# Patient Record
Sex: Male | Born: 1935 | Race: White | Hispanic: No | Marital: Married | Smoking: Never smoker
Health system: Southern US, Community
[De-identification: ages and names within clinical notes are randomized; demographics above are authoritative.]

## PROBLEM LIST (undated history)

## (undated) DIAGNOSIS — L0390 Cellulitis, unspecified: Secondary | ICD-10-CM

## (undated) DIAGNOSIS — Q649 Congenital malformation of urinary system, unspecified: Secondary | ICD-10-CM

## (undated) DIAGNOSIS — T7840XA Allergy, unspecified, initial encounter: Secondary | ICD-10-CM

## (undated) DIAGNOSIS — T839XXA Unspecified complication of genitourinary prosthetic device, implant and graft, initial encounter: Secondary | ICD-10-CM

## (undated) DIAGNOSIS — E669 Obesity, unspecified: Secondary | ICD-10-CM

## (undated) DIAGNOSIS — M545 Low back pain: Secondary | ICD-10-CM

## (undated) DIAGNOSIS — M1990 Unspecified osteoarthritis, unspecified site: Secondary | ICD-10-CM

## (undated) DIAGNOSIS — Z0000 Encounter for general adult medical examination without abnormal findings: Secondary | ICD-10-CM

## (undated) DIAGNOSIS — F32A Depression, unspecified: Secondary | ICD-10-CM

## (undated) DIAGNOSIS — S129XXA Fracture of neck, unspecified, initial encounter: Secondary | ICD-10-CM

## (undated) DIAGNOSIS — L500 Allergic urticaria: Principal | ICD-10-CM

## (undated) DIAGNOSIS — R339 Retention of urine, unspecified: Secondary | ICD-10-CM

## (undated) DIAGNOSIS — R739 Hyperglycemia, unspecified: Secondary | ICD-10-CM

## (undated) DIAGNOSIS — G709 Myoneural disorder, unspecified: Secondary | ICD-10-CM

## (undated) DIAGNOSIS — F329 Major depressive disorder, single episode, unspecified: Secondary | ICD-10-CM

## (undated) DIAGNOSIS — I509 Heart failure, unspecified: Secondary | ICD-10-CM

## (undated) DIAGNOSIS — E538 Deficiency of other specified B group vitamins: Secondary | ICD-10-CM

## (undated) HISTORY — DX: Retention of urine, unspecified: R33.9

## (undated) HISTORY — DX: Cellulitis, unspecified: L03.90

## (undated) HISTORY — DX: Allergic urticaria: L50.0

## (undated) HISTORY — DX: Myoneural disorder, unspecified: G70.9

## (undated) HISTORY — DX: Encounter for general adult medical examination without abnormal findings: Z00.00

## (undated) HISTORY — DX: Depression, unspecified: F32.A

## (undated) HISTORY — DX: Deficiency of other specified B group vitamins: E53.8

## (undated) HISTORY — DX: Hyperglycemia, unspecified: R73.9

## (undated) HISTORY — DX: Low back pain: M54.5

## (undated) HISTORY — DX: Allergy, unspecified, initial encounter: T78.40XA

## (undated) HISTORY — PX: SURGERY SCROTAL / TESTICULAR: SUR1316

## (undated) HISTORY — DX: Unspecified osteoarthritis, unspecified site: M19.90

## (undated) HISTORY — PX: INSERTION OF SUPRAPUBIC CATHETER: SHX5870

## (undated) HISTORY — DX: Unspecified complication of genitourinary prosthetic device, implant and graft, initial encounter: T83.9XXA

## (undated) HISTORY — DX: Major depressive disorder, single episode, unspecified: F32.9

## (undated) HISTORY — DX: Obesity, unspecified: E66.9

## (undated) HISTORY — DX: Heart failure, unspecified: I50.9

---

## 1999-04-19 ENCOUNTER — Encounter: Payer: Self-pay | Admitting: Urology

## 1999-04-20 ENCOUNTER — Ambulatory Visit (HOSPITAL_COMMUNITY): Admission: RE | Admit: 1999-04-20 | Discharge: 1999-04-20 | Payer: Self-pay | Admitting: Urology

## 1999-08-10 ENCOUNTER — Inpatient Hospital Stay (HOSPITAL_COMMUNITY): Admission: RE | Admit: 1999-08-10 | Discharge: 1999-08-12 | Payer: Self-pay | Admitting: Urology

## 1999-08-10 ENCOUNTER — Encounter (INDEPENDENT_AMBULATORY_CARE_PROVIDER_SITE_OTHER): Payer: Self-pay | Admitting: *Deleted

## 2005-08-21 ENCOUNTER — Ambulatory Visit (HOSPITAL_COMMUNITY): Admission: RE | Admit: 2005-08-21 | Discharge: 2005-08-22 | Payer: Self-pay | Admitting: General Surgery

## 2006-05-31 ENCOUNTER — Encounter: Admission: RE | Admit: 2006-05-31 | Discharge: 2006-05-31 | Payer: Self-pay | Admitting: Internal Medicine

## 2006-07-04 ENCOUNTER — Encounter: Payer: Self-pay | Admitting: *Deleted

## 2006-08-02 ENCOUNTER — Ambulatory Visit (HOSPITAL_COMMUNITY): Admission: RE | Admit: 2006-08-02 | Discharge: 2006-08-02 | Payer: Self-pay | Admitting: Interventional Radiology

## 2008-05-17 ENCOUNTER — Ambulatory Visit: Payer: Self-pay | Admitting: Internal Medicine

## 2008-05-17 ENCOUNTER — Inpatient Hospital Stay (HOSPITAL_COMMUNITY): Admission: EM | Admit: 2008-05-17 | Discharge: 2008-05-21 | Payer: Self-pay | Admitting: Internal Medicine

## 2008-05-17 ENCOUNTER — Encounter: Payer: Self-pay | Admitting: Emergency Medicine

## 2008-05-19 ENCOUNTER — Encounter (INDEPENDENT_AMBULATORY_CARE_PROVIDER_SITE_OTHER): Payer: Self-pay | Admitting: Gastroenterology

## 2008-09-02 ENCOUNTER — Ambulatory Visit (HOSPITAL_COMMUNITY): Admission: RE | Admit: 2008-09-02 | Discharge: 2008-09-03 | Payer: Self-pay | Admitting: Surgery

## 2008-09-02 ENCOUNTER — Encounter (INDEPENDENT_AMBULATORY_CARE_PROVIDER_SITE_OTHER): Payer: Self-pay | Admitting: Surgery

## 2008-09-17 ENCOUNTER — Encounter: Admission: RE | Admit: 2008-09-17 | Discharge: 2008-09-17 | Payer: Self-pay | Admitting: Surgery

## 2009-03-24 ENCOUNTER — Encounter: Admission: RE | Admit: 2009-03-24 | Discharge: 2009-03-24 | Payer: Self-pay | Admitting: Internal Medicine

## 2009-04-13 ENCOUNTER — Encounter: Admission: RE | Admit: 2009-04-13 | Discharge: 2009-04-13 | Payer: Self-pay | Admitting: Neurological Surgery

## 2010-10-13 LAB — BASIC METABOLIC PANEL
BUN: 29 mg/dL — ABNORMAL HIGH (ref 6–23)
CO2: 25 mEq/L (ref 19–32)
Chloride: 107 mEq/L (ref 96–112)
Creatinine, Ser: 0.81 mg/dL (ref 0.4–1.5)
GFR calc Af Amer: >60 mL/min (ref >60)
GFR calc non Af Amer: >60 mL/min (ref >60)
Sodium: 138 mEq/L (ref 135–145)

## 2010-10-13 LAB — DIFFERENTIAL
Basophils Absolute: 0 10*3/uL (ref 0.0–0.1)
Lymphs Abs: 0.7 10*3/uL (ref 0.7–4.0)
Monocytes Absolute: 0.6 10*3/uL (ref 0.1–1.0)
Monocytes Relative: 13 % — ABNORMAL HIGH (ref 3–12)

## 2010-10-13 LAB — CBC
Hemoglobin: 12.3 g/dL — ABNORMAL LOW (ref 13.0–17.0)
RDW: 18.4 % — ABNORMAL HIGH (ref 11.5–15.5)

## 2010-11-15 NOTE — Op Note (Signed)
NAME:  Brandon Franklin, Brandon Franklin                 ACCOUNT NO.:  [id number]   MEDICAL RECORD NO.:  [id number]          PATIENT TYPE:  INP   LOCATION:  1415                         FACILITY:  [facility]   PHYSICIAN:  John C. Madilyn Fireman, M.D.    DATE OF BIRTH:  [birth year]   DATE OF PROCEDURE:  05/20/2008  DATE OF DISCHARGE:                               OPERATIVE REPORT   PROCEDURE:  Colonoscopy.   INDICATIONS FOR PROCEDURE:  Severe microcytic anemia.   DESCRIPTION OF PROCEDURE:  The patient was placed in the left lateral  decubitus position and placed on the pulse monitor with continuous low-  flow oxygen delivered by nasal cannula.  He was sedated with 15 mcg of  IV fentanyl and 1 mg of IV Versed in addition to the medicine given for  the previous EGD.  The Olympus video colonoscope was inserted into the  rectum and advanced its entire length, but despite torquing maneuvers  and abdominal pressure, I could not reach the cecum.  The colon seemed  long the tortuous and I could not straighten out any of the loops.  In  addition, the prep was suboptimal and particularly for in the most  distal parts reached.  Is not clear where the point of furthest  advancement was, but that is estimated to be somewhere in the transverse  colon near the hepatic flexure.  The visualized with areas of the mucosa  of the transverse, descending and sigmoid revealed scattered diverticula  throughout and no other abnormalities seen, although polyps less 1-2 cm  in diameter could not be rule out in all areas due to suboptimal prep.  The rectum appeared normal.  On retroflexed view, the anus revealed no  obvious internal hemorrhoids.  The scope was then withdrawn and the  patient returned to the recovery room in stable condition.  He tolerated  the procedure well and there were no immediate complications.   IMPRESSION:  Diverticulosis, otherwise normal incomplete study to near  the hepatic flexure, limited by suboptimal prep.   PLAN:  Will keep on a clear liquid diet today and follow-up with barium  enema tomorrow.           ______________________________  Everardo All Madilyn Fireman, M.D.     JCH/MEDQ  D:  05/19/2008  T:  05/20/2008  Job:  [id number]   cc:   Theressa Millard, M.D.  Fax: [phone]

## 2010-11-15 NOTE — Consult Note (Signed)
NAME:  Brandon Franklin, Brandon Franklin                 ACCOUNT NO.:  [id number]   MEDICAL RECORD NO.:  [id number]          PATIENT TYPE:  INP   LOCATION:  1415                         FACILITY:  [facility]   PHYSICIAN:  Shirley Friar, MDDATE OF BIRTH:  [birth year]   DATE OF CONSULTATION:  DATE OF DISCHARGE:                                 CONSULTATION   REASON FOR CONSULTATION:  Anemia.   HISTORY OF PRESENT ILLNESS:  [name] is an pleasant 77 year old white  male admitted on May 17, 2008 secondary to groin pain and anemia.  On the day of admission he reported acute right-sided groin pain that  occurred after he sneezed 3 times.  The pain was worse with movement and  improved if he lied still.  He had a CT scan done at an outside  emergency room which was negative for any source of the groin pain.  His  hemoglobin was found to be 7.1 with an MCV of 65.  He was therefore  admitted for further evaluation of this anemia.  He denies any visible  rectal bleeding and denies any hematemesis, melena or hematochezia.  He  takes 4 regular aspirins per day.  He reports a colonoscopy over 5 years  ago but does not know the results and they are not available at the time  of this dictation.  As stated above, his hemoglobin was 7.1 with an MCV  of 65, platelet count 211, and white blood count 5.4.  BUN was elevated  at 25, creatinine at 1.0.  His fecal occult blood test negative.   PAST MEDICAL HISTORY:  1. Arthritis.  2. Benign prostatic hypertrophy.  3. Status post TURP.   MEDICATIONS:  1. Aspirin (four 325 mg tablets per day).  2. Over-the-counter oral analgesics.   ALLERGIES:  NO KNOWN DRUG ALLERGIES.   SOCIAL HISTORY:  Denies alcohol and drugs or tobacco.   FAMILY HISTORY:  Noncontributory.   REVIEW OF SYSTEMS:  Negative from a GI standpoint except stated above.   PHYSICAL EXAMINATION:  VITAL SIGNS:  Temperature 97.7, pulse 88, blood  pressure 155/93, O2 sats 94% on room air.  GENERAL:  Alert,  no acute distress.  ABDOMEN:  Soft, nontender, nondistended.  Active bowel sounds.   LABORATORY DATA:  White blood count 5.4, hemoglobin 7.1, MCV 65,  hematocrit 23, platelet count 211, INR 1.1, BUN 25, creatinine 1.0.  All  other labs listed in hospital record.   IMPRESSION:  An 77 year old white male with microcytic anemia with heme-  negative stool.  Agree with anemia workup, checking multiple lab studies  to see whether any other sources present for his iron loss other than  occult  gastrointestinal blood loss.  Would do a colonoscopy and upper endoscopy  on May 19, 2008 to look for sources of occult blood loss.  Will  prep patient today and put him on clear liquids. Will plan to do these  procedures tomorrow.      Shirley Friar, MD  Electronically Signed     VCS/MEDQ  D:  05/18/2008  T:  05/18/2008  Job:  [id number]   cc:   Theressa Millard, M.D.  Fax: [phone]

## 2010-11-15 NOTE — Op Note (Signed)
NAME:  Brandon Franklin, Brandon Franklin                 ACCOUNT NO.:  [id number]   MEDICAL RECORD NO.:  [id number]          PATIENT TYPE:  INP   LOCATION:  1415                         FACILITY:  [facility]   PHYSICIAN:  John C. Madilyn Fireman, M.D.    DATE OF BIRTH:  07-30-1925   DATE OF PROCEDURE:  05/19/2008  DATE OF DISCHARGE:                               OPERATIVE REPORT   ESOPHAGOGASTRODUODENOSCOPY   INDICATIONS FOR PROCEDURE:  Severe microcytic anemia.   PROCEDURE:  The patient was placed in the left lateral decubitus  position and placed on the pulse monitor with continuous low-flow oxygen  delivered by nasal cannula.  He was sedated 35 mcg IV fentanyl and 3 mg  IV Versed.  The Olympus video endoscope was advanced under direct vision  into the oropharynx and esophagus.  The esophagus was straight and of  normal caliber at the squamocolumnar line at 36 above and approximately  3-cm hiatal hernia.  There was no ring, stricture, esophagitis, or other  visible abnormality of the GE junction.  The stomach was entered, and a  small amount of liquid secretions were suctioned from the fundus.  Retroflexed view of the cardia confirmed a hiatal hernia, and it was  otherwise unremarkable.  The fundus and body appeared normal.  The  antrum showed a few areas of punctate erythema and 2 or 3 tiny erosions  with a scant amount of exudate consistent with mild to moderate antral  gastritis.  CLO-test was obtained.  The pylorus was not deformed and  easily allowed passage of the endoscope tip in the duodenum.  In the  bulb, there was erythema and granularity with 2 or 3 small erosions with  exudate and 1 shallow ulcer approximately 5 x 2 mm in diameter in the  distal bulb with no definite stigma of hemorrhage.  The postbulbar  duodenum appeared relatively normal.  Biopsies were taken to rule out  celiac disease.  The scope was then withdrawn, and the patient prepared  for colonoscopy.  He tolerated the procedure well, and  there were no  immediate complications.   IMPRESSION:  1. Small duodenal ulcer and several duodenal and tiny gastric      erosions.  2. Five moderate-sized hiatal hernia.   PLAN:  Will treat proton-pump inhibitor and treat for eradication of  Helicobacter if CLO-test is positive and will proceed with colonoscopy.           ______________________________  Everardo All. Madilyn Fireman, M.D.     JCH/MEDQ  D:  05/19/2008  T:  05/20/2008  Job:  [id number]   cc:   Theressa Millard, M.D.  Fax: [phone]

## 2010-11-15 NOTE — Discharge Summary (Signed)
NAME:  Brandon Franklin, Brandon Franklin                 ACCOUNT NO.:  [id number]   MEDICAL RECORD NO.:  [id number]          PATIENT TYPE:  INP   LOCATION:  1415                         FACILITY:  [facility]   PHYSICIAN:  Theressa Millard, M.D.    DATE OF BIRTH:  16-May-1926   DATE OF ADMISSION:  05/17/2008  DATE OF DISCHARGE:  05/21/2008                               DISCHARGE SUMMARY   ADMITTING DIAGNOSIS:  Right groin pain and microcytic anemia.   DISCHARGE DIAGNOSES:  1. Chronic blood loss anemia.  2. Duodenal ulceration.  3. Iron deficiency anemia secondary to number #2.  4. Right groin pain, ? Etiology.  5. Chronic aches and pains for which the patient takes aspirin      chronically.  6. Minor transfusion reaction with fever during transfusion, all      subsequent studies negative for evidence of serious transfusion      reaction.   The patient is an 77 year old white male who is rarely seen in the  office and has been in generally good health.  Takes 2 - 3 aspirin two  to three times a day for various aches and pains.  He developed right  groin pain and was seen in the emergency department in [facility] and  was found to have severe microcytic anemia.   HOSPITAL COURSE:  The patient was admitted and was noted to have a  hemoglobin of 7.1 with an MCV of 65.  He was given 2 units of blood and  his hemoglobin rose to about 8.3.  He was seen in consultation by GI who  performed an upper endoscopy with findings of duodenal erosions but no  active bleeding.  Colonoscopy was incomplete but was negative.  A follow-  up single contrast barium enema did not reveal any significant lesions.  Of note is the fact that the patient had a CT scan of the abdomen during  his emergency room visit and there were no finding to that either.   Although the patient was heme-negative it is concluded that the patient  is suffering from a chronic blood loss anemia from the duodenal  ulceration.  He has been asked to stop  aspirin products and has been  started on proton pump inhibitors.   DISCHARGE MEDICATIONS:  1. Proton pump inhibitor (omeprazole) 20 mg daily for the next month.  2. Iron sulfate (65 mg of iron, 325 mg total) take one daily.   He is to no longer take aspirin, Advil, ibuprofen or Aleve.  He may use  acetaminophen for aches and pains.  He will call to make an appointment  to see me in approximately 3 weeks.     Theressa Millard, M.D.  Electronically Signed    JO/MEDQ  D:  05/21/2008  T:  05/21/2008  Job:  [id number]

## 2010-11-15 NOTE — Op Note (Signed)
NAME:  Brandon Franklin, Brandon Franklin                 ACCOUNT NO.:  [id number]   MEDICAL RECORD NO.:  [id number]          PATIENT TYPE:  OIB   LOCATION:  1528                         FACILITY:  [facility]   PHYSICIAN:  Wilmon Arms. Corliss Skains, M.D. DATE OF BIRTH:  [birth year]   DATE OF PROCEDURE:  09/02/2008  DATE OF DISCHARGE:                               OPERATIVE REPORT   PREOPERATIVE DIAGNOSIS:  Large right inguinal hernia.   POSTOPERATIVE DIAGNOSIS:  Very large right inguinal hernia.   PROCEDURE PERFORMED:  Right inguinal hernia repair with mesh.   SURGEON:  Wilmon Arms. Corliss Skains, M.D.   ASSISTANT:  Anselm Pancoast. Zachery Dakins, M.D.   ANESTHESIA:  General endotracheal.   INDICATIONS FOR PROCEDURE:  The patient is an 77 year old male who has  had a previous right Spigelian hernia repair and left inguinal hernia  repair in 2007.  He began having right groin pain in November and was  found to have a fairly large right inguinal hernia.  This has become  much larger in just a couple of months.  He denies any GI obstructive  symptoms.  He denies any urinary symptoms.  He presents now for right  inguinal hernia repair with mesh.   DESCRIPTION OF PROCEDURE:  The patient is brought to the operating room,  placed in supine position on the operating room table.  After an  adequate level of general anesthesia was obtained, the patient's right  groin was shaved, prepped with Betadine and draped in sterile fashion.  Time-out was taken to assure proper patient, proper procedure.  We were  able to reduce this hernia manually.  An oblique incision was made above  the right inguinal ligament after infiltrating with 0.25% Marcaine with  epinephrine.  Dissection was carried down through a considerable amount  of subcutaneous fat to the external oblique fascia.  There is a large  hernia bulge coming through the external ring.  We opened the external  oblique fascia along direction of its fibers down to the external ring.  There were  some Prolene sutures noted in the fascia.  There were a lot  of adhesions laterally and just underneath the external oblique fascia.  In retrospect this probably represented the mesh from his previous  Spigelian hernia repair by Dr. Maryagnes Amos.  We opened up the external oblique  fascia all the way down to the external ring.  We were able to bluntly  dissect around a very large spermatic cord.  We retracted this with a  Penrose drain.  The floor of the inguinal canal was inspected and was  lax but there was no obvious direct defect.  We then began trying to  skeletonize the very large spermatic cord.  The hernia sac was enormous  and ran down into the scrotum.  We were able to manually retract this up  into the wound along with the testicle.  We had a lot of difficulty  trying to dissect a plane between the hernia sac and the testicle.  Therefore, we opened the hernia sac.  There were lot adhesions inside  the hernia sac but we  were able to dissect into the sac.  We could  clearly identify the cord structures with the vessels in the vas  retracted medially.  The patient has a large amount of small bowel  incarcerated in his hernia sac.  The small bowel was densely adherent to  the side of the sac.  We spent some considerable amount of time  dissecting the small bowel free from the side of the hernia sac.  We  inspected the small bowel for damage and there was none.  We did notice  a Meckel's diverticulum along the small bowel.  However we chose to  leave this alone as removing it would have precluded the use of  synthetic mesh.  We reduced of the small bowel up the back in the  peritoneal space.  We dissected the upper end of the hernia sac free  from the surrounding tissue.  We amputated the excess tissue and sent  this for pathologic examination.  The neck of the hernia sac was then  ligated with 2-0 Vicryl suture and the stump was reduced up to the  preperitoneal space.  We then inspected  the remaining cord structures.  There is considerable amount of fat around the vessels of the spermatic  cord but there is no other sign of hernia.  We then began our repair by  running the floor of the inguinal canal with a 2-0 Prolene suture from  the pubic tubercle all the way back to the internal ring.  We cut a 3 x  6 inch piece of Ultrapro mesh and secured this beginning at the pubic  tubercle with 2-0 Prolene.  We cut the keyhole shape to accommodate the  large cord.  We secured this to the shelving edge inferiorly and the  internal oblique fascia superiorly.  The lateral end of the mesh was  tacked down to the previously placed Gore-Tex mesh.  We tried to tuck  some of the mesh underneath the external oblique fascia laterally but  this is fairly difficult due to the dense adhesions.  We removed the  Penrose drain.  The fascia was reapproximated with 2-0 Vicryl.  3-0  Vicryl was used to close subcutaneous tissues and 4-0 Monocryl was used  to close skin.  Steri-Strips and clean dressings were applied.  The  patient was then extubated and brought to the recovery room in stable  condition.  All sponge, instrument, and needle counts correct.      Wilmon Arms. Tsuei, M.D.  Electronically Signed     MKT/MEDQ  D:  09/02/2008  T:  09/03/2008  Job:  [id number]

## 2010-11-15 NOTE — H&P (Signed)
NAME:  Brandon Franklin, Brandon Franklin                 ACCOUNT NO.:  [id number]   MEDICAL RECORD NO.:  [id number]          PATIENT TYPE:  INP   LOCATION:  1236                         FACILITY:  [facility]   PHYSICIAN:  Therisa Doyne, MD    DATE OF BIRTH:  Oct 30, 1925   DATE OF ADMISSION:  05/17/2008  DATE OF DISCHARGE:                              HISTORY & PHYSICAL   CHIEF COMPLAINT:  Groin pain and anemia.   HISTORY OF PRESENT ILLNESS:  An 77 year old white male with no  significant past medical history, who presents as a direct admission for  workup of anemia.  The patient developed acute right-sided groin pain  yesterday.  This occurred after he sneezed 3 times.  The pain is worse  with movement and is improved if he lies still.  Because of these  symptoms he went to an outside emergency department at [facility]; there  they performed a CT scan, which was unrevealing for his groin pain.  However, further workup showed a hemoglobin of 7.1 with mean cell volume  of 65.  Because of these findings, he was transferred to [facility] for further evaluation and management.   On talking with the patient he denies any fatigue, weakness, weight loss  or weight gain.  He denies episodes of hematemesis, hematochezia, melena  or bright red blood per rectum.  He denies any alcohol use, but does  state that he uses 4 tablets of aspirin daily; he uses 325 mg tablets,  for a total of 1300 mg daily.  He also used over-the-counter analgesics.   PAST MEDICAL HISTORY:  1. Arthritis.  2. Status post TURP for benign prostatic hypertrophy.   SOCIAL HISTORY:  The patient lives in [place] with his wife.  He  denies any tobacco, alcohol or drugs.   FAMILY HISTORY:  Negative for GI malignancies.   REVIEW OF SYSTEMS:  Positive for right-sided hip pain.  Otherwise, all  systems reviewed were negative, except as mentioned above in history of  present illness.   MEDICATIONS:  1. Aspirin, approximately 1300 mg  daily.  2. Over-the-counter oral analgesics.  No prescription medications.   ALLERGIES:  NO KNOWN DRUG ALLERGIES.   PHYSICAL EXAMINATION:  VITAL SIGNS: Temperature afebrile.  Blood  pressure 164/84, pulse 75, respirations 18, oxygen saturation 98% on  room air.  GENERAL:  No acute distress.  HEENT:  Normocephalic atraumatic.  Pupils equal, round and equal to  light and accommodation.  Extraocular movements are intact.  Oropharynx  was pink and moist without any lesions.  NECK:  Supple.  No lymphadenopathy.  No jugular venous distention  bilaterally.  ABDOMEN:  Positive bowel sounds; soft, nontender and nondistended.  EXTREMITIES:  No clubbing, cyanosis or edema.  Dorsalis pedis pulses 2+  bilaterally.  SKIN:  No rashes.  BACK:  No CVA tenderness.   LABORATORY STUDIES:  Hemoglobin 7.1 with an MCV of 65, platelets 236,  white blood cell count 5.2.  BMP:  Within normal limits.  Normal  coagulation studies. Negative urinalysis.  Lactic acid 0.9.  Fecal  occult blood  was negative.  Reticulocyte count 2.2%.  CT scan of the  abdomen and pelvis did not reveal any cause of the patient's acute  abdominal pain.  He did have mild cardiomegaly with a small pericardial  effusion.  Other incidental findings were noted.   EKG:  1. Normal sinus rhythm at 75 beats per minute.  2. EKG #2 -- Normal sinus rhythm at 81 beats per minute, with      premature atrial contractions and a left anterior fascicular block.   ASSESSMENT AND PLAN:  Mr. Ardoin is an 77 year old white male with no  significant past medical history.  He presents to [facility]  for evaluation of microcytic anemia.   1. We will admit the patient to [facility] at [facility].  Will monitor him on a telemetry bed under the care Dr.      Theressa Millard.   1. Microcytic anemia:  At this time it is an unclear source where the      patient's blood loss is from.  I suspect this may be occult GI      bleeding.   However, we will work this out with an iron panel,      vitamin B12 level, folate level, reticulocyte panel, and a Comb's      test.  Check hepatic function panel and thyroid studies.  We will      transfuse the patient 2 units of packed red blood cells now, and      check the post-CBC transfusion level.  Will consider GI consult in      the morning for possible EGD and colonoscopy to further evaluate      his microcytic anemia; heme check stools.   1. Groin pain:  Question whether or not this is musculoskeletal in      nature, as this occurred after sneezing and it is worse with      movement.  His CT scan was negative and did not reveal any findings      for acute hernias.   1. Fluids, electrolytes, nutrition:  Normal saline at 125 mL per hour      until electrolytes are stable.  N.P.O.   1. Prophylaxis:  Deep vein thrombosis prophylaxis - Pneumatic      compression to the bilateral extremities.  We will not be using      Lovenox, as the patient has anemia and it is unclear whether he is      actively bleeding.      Therisa Doyne, MD  Electronically Signed     SJT/MEDQ  D:  05/17/2008  T:  05/17/2008  Job:  [id number]

## 2010-11-18 NOTE — Consult Note (Signed)
NAME:  Brandon Franklin, Brandon Franklin                 ACCOUNT NO.:  [id number]   MEDICAL RECORD NO.:  [id number]          PATIENT TYPE:  OUT   LOCATION:  XRAY                         FACILITY:  [facility]   PHYSICIAN:  Sanjeev K. Deveshwar, M.D.DATE OF BIRTH:  10/27/1925   DATE OF CONSULTATION:  07/04/2006  DATE OF DISCHARGE:                                 CONSULTATION   CHIEF COMPLAINT:  Cerebral aneurysms.   HISTORY OF PRESENT ILLNESS:  This is a very pleasant 77 year old male  who was referred to Dr. Corliss Skains through the courtesy of Dr. Deneen Harts.  The patient has a recent history of headaches, dizziness and  presyncope.  He was evaluated by Dr. Deneen Harts and an MRA was  performed on July 31, 2005.  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  There was also noted be mild  atherosclerosis in the right middle cerebral artery distribution.  The  patient presents today with his wife to discuss treatment options.   PAST MEDICAL HISTORY:  The patient has been very healthy.  He has a  history gastroesophageal reflux disease.  He has had some seasonal  allergies and sinus trouble.  He has a lot of neck pain which might be  degenerative joint disease, although he also has constant headaches  associated with his neck pain.  There is a question of old CVAs on his  MRI.  He has also had some question of TIAs.  There is a possibility of  orthostatic hypotension.   SURGICAL HISTORY:  Significant for hernia repairs as well as TURP.  He  denies any previous problems with anesthesia.   ALLERGIES:  He denies allergy to latex or contrast dye.  He is allergic  to SULFA.   CURRENT MEDICATIONS:  He takes aspirin 325 mg daily as his only  scheduled medication.   SOCIAL HISTORY:  The patient is married.  He has two daughters.  He  lives in [place].  He is never smoked or used alcohol to  any  significant degree.  He is retired from [employer].   FAMILY HISTORY:  Both parents died from strokes in their 69s or 5s.   IMPRESSION AND PLAN:  As noted the patient presents today to discuss  evaluation and possible treatment of two possible cerebral aneurysms  noted on an MRA performed July 31, 2005, to evaluate headaches and  dizziness.  Dr. Corliss Skains reviewed the results of the MRI/MRA with the  patient and his wife.  He pointed out the areas that were concerning for  aneurysms.  Treatment options were discussed including continued  monitoring versus open surgery versus possible coiling and/or stenting.  The risks and benefits of each option were discussed, as well. All of  their questions were addressed.  They seem to have a good understanding  of the situation by the end of the consultation.   Dr. Corliss Skains recommended a cerebral angiogram for further evaluation of  the possible  aneurysms. The patient and his wife are anxious to proceed  with the angiogram and endovascular treatment if felt to be indicated.  Dr. 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  The patient was given a prescription for Plavix to be  started three days prior to the intervention.  He felt that the second  aneurysm could be monitored and may not require treatment.   Greater than 40 minutes was spent on this consult.      Delton See, P.A.    ______________________________  Grandville Silos. Corliss Skains, M.D.    DR/MEDQ  D:  07/04/2006  T:  07/04/2006  Job:  [id number]   cc:   Theressa Millard, M.D.  Bevelyn Buckles. Nash Shearer, M.D.

## 2010-11-18 NOTE — Op Note (Signed)
[place]  Patient:    Brandon Franklin, Brandon Franklin                          MRN: [id number] Proc. Date: 08/10/99 Adm. Date:  [id number] Attending:  Katherine Roan CC:         Winn Jock. Earl Gala, M.D.                           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  OPERATION:  Transurethral resection if bladder mass, midtrigone, and transurethral resection of prostate.  SURGEON:  Rozanna Boer., M.D.  ANESTHESIA:  General.  DESCRIPTION OF PROCEDURE:  The patient was placed on the operating table in the  dorsal lithotomy position.  After satisfactory induction of general endotracheal anesthesia, he was prepped and draped with Betadine, given IV antibiotics.  He id have a coronal hypospadias and the urethra was dilated with a 30 [employer] sound after which the 28 Olympus continuous-flow resectoscope was inserted.  The bladder was carefully inspected.  No other lesions were seen except on the trigone with  about a 2 cm bladder mass in midtrigone was seen and photographed and excised with a loop with three different cuts.  This was down to the base.  The base was fulgurated.  The tissue was then removed and sent for separate specimen and remaining recurrent BPH was then excised beginning at the bladder neck on the left side at the 12 oclock position.  We moved laterally in a clockwise fashion resection the adenoma down to prostatic surgical capsule.  Then on the right side in a counterclockwise fashion, the remaining tissue on the patients right side as similarly resected.  The median lobe was resected flush with the verumontanum.  Hemostasis was achieved by electrocoagulation, ___________ evacuated from the bladder.  When viewing from the veru of  the bladder neck, it was wide open and clear.  As the scope was removed the patient voided with a good stream.  A 24 Ainsworth catheter was inserted and left to straight drainage with mild traction. The irrigant was clear.  The patient was taken to the recovery room in good condition.  He will be admitted for postoperative care. DD:  08/10/99 TD:  08/10/99 Job: [id number]

## 2010-11-18 NOTE — H&P (Signed)
[place]. [facility]  Patient:    Brandon Franklin, Brandon Franklin                          MRN: [id number] Adm. Date:  08/09/99 Attending:  Rozanna Boer., M.D. CC:         Winn Jock. Earl Gala, M.D.                         History and Physical  BRIEF HISTORY:  This 77 year old white male was admitted with hematuria and finding of a bladder mass and recurrent BPH, for TUR.  He had a previous TURP, March 1992. He had an IVP done July 22, 1999 because of a new finding of hematuria, and e had normal upper tracts, increased post-void residual, and cystoscopy showed inflammatory mass at the base of the bladder on the trigone that was separate from his recurrent BPH, which was also noted.  The hematuria was first found on June 21, 1999.  He has been voiding with a little bit more hesitancy and urgency than he has been recently.  He has not had any urinary tract infections.  PAST MEDICAL HISTORY:  MEDICATIONS:  None except aspirin which was discontinued two weeks ago.  ALLERGIES:  None.  PREVIOUS OPERATIONS:  TURP, March 1992 and bilateral spermatoceles, October 2000.  REVIEW OF SYSTEMS:  Nonsmoker.  No cardiac or pulmonary symptomatology.  He does have a history of allergies to dust, etc.  No bowel change.  No alcohol.  No arthritis.  FAMILY HISTORY/SOCIAL HISTORY:  He is married with two daughters.  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  His parents both died of strokes.  He has one sister.  He has no family history of diabetes, cancer, or heart disease.  PHYSICAL EXAMINATION:  GENERAL:  Pleasant, jovial, white male with white hair.  The patient appears to be in no acute distress.  VITAL SIGNS:  Blood pressure 169/97, pulse 81.  Weight 253.  Height 5 feet 11 inches.  HEENT:  Clear.  He has most of his teeth present.  CHEST:  Clear.  HEART:  Heart sounds are normal.  No murmurs, gallops, or rubs.  ABDOMEN:  Soft, benign, but obese.  No  masses.  His bladder is not distended.  GENITOURINARY:  Normal scrotum.  Testes bilaterally descended.  He does have a coronal hypospadias noted with a dorsal hood.  EXTREMITIES:  No edema.  Good distal pulses.  Intact sensation to light touch.  IMPRESSION:  1. Inflammatory bladder mass.  2. Hematuria.  3. Recurrent benign prostatic hypertrophy.  RECOMMENDATIONS:  TURBT and TURP as indicated. DD:  08/10/99 TD:  08/10/99 Job: [id number]

## 2010-11-18 NOTE — Discharge Summary (Signed)
[place]. [facility]  Patient:    Brandon Franklin, Brandon Franklin                          MRN: [id number] Adm. Date:  [id number] Disc. Date: [id number] Attending:  Katherine Roan Dictator:   Rozanna Boer., M.D. CC:         Winn Jock. Earl Gala, M.D.                           Discharge Summary  DISCHARGE DIAGNOSES: 1.  Bladder mass, trigone; pending pathology report. 2.  Hematuria. 3.  Recurrent benign prostatic hypertrophy.  OPERATION/PROCEDURE: 1.  TUR bladder tumor, mid trigone (2.0 cms), TUR prostate on August 10, 1999.  HISTORY OF PRESENT ILLNESS:  This 77 year old patient has had hematuria with finding on cystoscopy of a bladder mass in the mid trigone and seen to be separate from his recurrent BPH.  He had a previous TURP in March 1992.  IVP on July 12, 1999 showed normal upper tracts, increased postvoid residual, an cystoscopy showed the inflammatory mass at the base of the bladder on the trigone.  He also had recurrent BPH.  Hematuria was first found in December 2000.  He had bilateral spermatoceles removed October 2000 in addition to his TURP.  MEDICATIONS:  None.  ALLERGIES:  None.  REVIEW OF SYSTEMS:  Unremarkable.  HOSPITAL COURSE:  After satisfactory preoperative evaluation he was taken to the operating room.  His BUN was 22, his creatinine 1.0.  His electrolytes were normal. His hematocrit was 40.6 preoperatively and 37.5 postoperatively.  He had separate resection of the mid trigone bladder mass and also his prostate tissue but the pathology was still pending at the time of discharge.  His urine cleared overnight and the catheter was removed on the second postoperative day, after which he had voided a couple of times with clear urine without difficulty.  DISCHARGE MEDICATIONS: 1.  Septra DS 1 tablet p.o. b.i.d. for ten days. 2.  No aspirin.  DISPOSITION:  Return to the office in ten to 14 days for  follow-up.  ACTIVITY:  Instructed in light activity. DD:  08/12/99 TD:  08/12/99 Job: [id number]

## 2010-11-18 NOTE — Consult Note (Signed)
NAME:  ANNIE, ROSEBOOM                 ACCOUNT NO.:  [id number]   MEDICAL RECORD NO.:  [id number]          PATIENT TYPE:  AMB   LOCATION:  SDS                          FACILITY:  [facility]   PHYSICIAN:  Sanjeev K. Deveshwar, M.D.DATE OF BIRTH:  04/30/1926   DATE OF CONSULTATION:  08/02/2006  DATE OF DISCHARGE:                                 CONSULTATION   BRIEF HISTORY:  Mr. Airrion Otting is a very pleasant 77 year old male  referred to Dr. Corliss Skains through the courtesy of Dr. Deneen Harts.  Please see the dictated consult note from July 04, 2006.  Arrangements  were made to have the patient admitted to [facility] today  August 02, 2006 for a cerebral angiogram and possible stenting and/or  coiling of cerebral aneurysms previously noted on an MRA performed  July 31, 2005.   Dr. Corliss Skains did perform the cerebral angiogram today; however, he was  not able to intervene on the aneurysms due to severe tortuosity of the  patient's vessels.  The aneurysms did appear to be stable.  Dr.  Corliss Skains has recommended further monitoring with repeat MRI/MRAs.  The  patient was told to remain on aspirin.  He had been started on Plavix  prior to this admission in anticipation of possible stenting.  He was  told he could stop his Plavix medication.   LABORATORY DATA:  On admission the patient's BUN was 25, creatinine was  0.8.  His INR was 1.1, his PTT was 35.  Hemoglobin was noted to be  mildly low at 10.9, hematocrit 33.2, WBC 6.6 thousand, platelets  215,000.  His GFR was greater than 60.  Potassium was 4.3, glucose 92.   IMPRESSION:  1. Cerebral aneurysms by magnetic resonance imaging and cerebral      angiogram performed August 02, 2006.  Please see Dr. Fatima Sanger      dictated report for full details.  2. No intervention due to severe tortuosity of the patient's vessels.  3. Mild anemia by a complete blood count performed on July 31, 2006.  4. History of gastroesophageal  reflux disease.  5. History of seasonal allergies and sinus problems.  6. Degenerative joint disease.  7. Old cerebrovascular accidents by magnetic resonance imaging with      questionable transient ischemic attacks.  8. SULFA ALLERGY.      Delton See, P.A.    ______________________________  Grandville Silos. Corliss Skains, M.D.    DR/MEDQ  D:  08/02/2006  T:  08/02/2006  Job:  [id number]   cc:   Theressa Millard, M.D.  Bevelyn Buckles. Nash Shearer, M.D.

## 2010-11-18 NOTE — Op Note (Signed)
NAME:  ESPN, ZEMAN                 ACCOUNT NO.:  [id number]   MEDICAL RECORD NO.:  [id number]          PATIENT TYPE:  AMB   LOCATION:  SDS                          FACILITY:  [facility]   PHYSICIAN:  Gita Kudo, M.D. DATE OF BIRTH:  10/26/1925   DATE OF PROCEDURE:  08/21/2005  DATE OF DISCHARGE:                                 OPERATIVE REPORT   OPERATIVE PROCEDURE:  Left inguinal hernia repair, direct, Kugel two-piece  mesh.   SURGEON:  Gita Kudo, M.D.   ANESTHESIA:  General.   PREOPERATIVE DIAGNOSIS:  Left inguinal hernia.   POSTOPERATIVE DIAGNOSIS:  Left inguinal hernia, direct.   CLINICAL SUMMARY:  A 77 year old male with longstanding left inguinal  hernia, who comes in for elective repair.  Of note is the fact that I  repaired a right spigelian hernia on him about 10 years ago.   OPERATIVE FINDINGS:  The patient had a large direct inguinal hernia with no  evidence of an indirect hernia.  The cord structures and nerves were  identified and not injured.   OPERATIVE PROCEDURE:  Under satisfactory general endotracheal anesthesia,  having received 1 g Ancef preop, the patient was positioned, prepped and  draped in the standard fashion.  A total of 30 mL of 0.5% Marcaine with  epinephrine was infiltrated for postop analgesia.  A transverse left lower  abdominal incision made, carried down to and through external ring and  external oblique.  Bleeders were coagulated or tied with 3-0 Vicryl.  Self-  retaining retractors were placed and with good exposure, the cord and its  contents mobilized, dissected for an indirect sac and none found.  Then they  were held away with a Penrose drain while the direct hernia was freed around  to its edges.  Then an incision made over the hernia through the posterior  wall of the canal from the pubis to the internal ring.  Finger dissection  used to reduce the preperitoneal contents, which was then held away with  moistened gauze.   Following this, the circular portion of Kugel mesh was  anchored at Cooper's ligament with a 0 Prolene suture and unfolded laterally  and inferiorly.  Then the gauze was removed and the mesh unfolded superiorly  and medially and lay in excellent position.  A second 0 Prolene suture was  used to anchor the medial portion of the mesh to the undersurface of the  abdominal wall.  The floor of the canal was then closed with a running 0  Prolene suture, taking intermittent bites of the mesh and when tied at the  internal ring, the ring was snug and the ends of the suture left long.  Then  the oval portion of the mesh was anchored at the internal ring with a  previously-placed suture and tacked around the periphery with three sutures  to the internal oblique, soft tissue near the pubis, and inguinal ligament.  The tails were then brought around the cord and sutured to each other  lateral to it.  Following this, the wound was  lavaged with saline, checked  for hemostasis and closed in layers.  Running 2-  0 Vicryl approximated the external oblique, interrupted 2-0 Vicryl for deep  fascia, 3-0 Vicryl subcu, Steri-Strips for skin.  Sterile absorbent  dressings were applied, and the patient went to the recovery room from the  operating room in good condition.           ______________________________  Gita Kudo, M.D.     MRL/MEDQ  D:  08/21/2005  T:  08/21/2005  Job:  [id number]   cc:   Courtney Paris, M.D.  Fax: [phone]   Theressa Millard, M.D.  Fax: [phone]

## 2011-04-05 LAB — IRON AND TIBC
Saturation Ratios: 3 — ABNORMAL LOW
TIBC: 403
UIBC: 390

## 2011-04-05 LAB — CBC
HCT: 23.3 — ABNORMAL LOW
HCT: 25 — ABNORMAL LOW
HCT: 25.9 — ABNORMAL LOW
HCT: 26.7 — ABNORMAL LOW
HCT: 29.5 — ABNORMAL LOW
Hemoglobin: 7.1 — CL
Hemoglobin: 7.5 — CL
Hemoglobin: 8.1 — ABNORMAL LOW
Hemoglobin: 8.4 — ABNORMAL LOW
Hemoglobin: 9.3 — ABNORMAL LOW
MCHC: 30.5
MCHC: 31.4
MCHC: 31.4
MCHC: 31.6
MCV: 65.4 — ABNORMAL LOW
MCV: 68.4 — ABNORMAL LOW
MCV: 69.2 — ABNORMAL LOW
MCV: 70.3 — ABNORMAL LOW
Platelets: 195
Platelets: 201
Platelets: 205
Platelets: 211
Platelets: 236
RBC: 3.56 — ABNORMAL LOW
RBC: 3.79 — ABNORMAL LOW
RBC: 3.83 — ABNORMAL LOW
RBC: 3.85 — ABNORMAL LOW
RBC: 4.19 — ABNORMAL LOW
RDW: 19 — ABNORMAL HIGH
RDW: 22.2 — ABNORMAL HIGH
RDW: 22.3 — ABNORMAL HIGH
RDW: 23.2 — ABNORMAL HIGH
WBC: 5.2
WBC: 5.4
WBC: 6
WBC: 6.1
WBC: 6.6

## 2011-04-05 LAB — HEMOGLOBIN AND HEMATOCRIT, BLOOD
HCT: 28.3 — ABNORMAL LOW
Hemoglobin: 8.9 — ABNORMAL LOW

## 2011-04-05 LAB — BASIC METABOLIC PANEL
Calcium: 9.6
Creatinine, Ser: 1
Glucose, Bld: 115 — ABNORMAL HIGH
Potassium: 4.5
Sodium: 139

## 2011-04-05 LAB — URINALYSIS, ROUTINE W REFLEX MICROSCOPIC
Bilirubin Urine: NEGATIVE
Glucose, UA: NEGATIVE
Ketones, ur: NEGATIVE
Leukocytes, UA: NEGATIVE
Nitrite: NEGATIVE
Protein, ur: 30 — AB
Urobilinogen, UA: 1
pH: 7

## 2011-04-05 LAB — CROSSMATCH

## 2011-04-05 LAB — URINE CULTURE: Colony Count: 10000

## 2011-04-05 LAB — TRANSFUSION REACTION
DAT C3: NEGATIVE
Post RXN DAT IgG: NEGATIVE

## 2011-04-05 LAB — APTT: aPTT: 32

## 2011-04-05 LAB — DIFFERENTIAL
Basophils Absolute: 0
Basophils Relative: 1
Basophils Relative: 1
Eosinophils Relative: 1
Eosinophils Relative: 2
Monocytes Relative: 14 — ABNORMAL HIGH
Neutro Abs: 3.5
Neutrophils Relative %: 68

## 2011-04-05 LAB — TSH: TSH: 2.325

## 2011-04-05 LAB — HEPATIC FUNCTION PANEL
AST: 16
Albumin: 3.5
Alkaline Phosphatase: 45
Total Bilirubin: 0.7

## 2011-04-05 LAB — RETICULOCYTES: RBC.: 3.41 — ABNORMAL LOW

## 2011-04-05 LAB — OCCULT BLOOD X 1 CARD TO LAB, STOOL: Fecal Occult Bld: NEGATIVE

## 2011-04-05 LAB — POCT CARDIAC MARKERS: CKMB, poc: 1.3

## 2011-04-05 LAB — VITAMIN B12: Vitamin B-12: 396 (ref 211–911)

## 2011-04-05 LAB — CLOTEST (H. PYLORI), BIOPSY: Helicobacter screen: NEGATIVE

## 2011-04-05 LAB — PROTIME-INR
INR: 1.1
Prothrombin Time: 14.4

## 2011-04-05 LAB — PREPARE RBC (CROSSMATCH)

## 2012-05-11 ENCOUNTER — Encounter (HOSPITAL_BASED_OUTPATIENT_CLINIC_OR_DEPARTMENT_OTHER): Payer: Self-pay | Admitting: Emergency Medicine

## 2012-05-11 ENCOUNTER — Emergency Department (HOSPITAL_BASED_OUTPATIENT_CLINIC_OR_DEPARTMENT_OTHER)
Admission: EM | Admit: 2012-05-11 | Discharge: 2012-05-11 | Disposition: A | Discharge status: Home / Self Care | Payer: Medicare Other | Attending: Emergency Medicine | Admitting: Emergency Medicine

## 2012-05-11 DIAGNOSIS — T83091A Other mechanical complication of indwelling urethral catheter, initial encounter: Secondary | ICD-10-CM | POA: Insufficient documentation

## 2012-05-11 DIAGNOSIS — Y846 Urinary catheterization as the cause of abnormal reaction of the patient, or of later complication, without mention of misadventure at the time of the procedure: Secondary | ICD-10-CM | POA: Insufficient documentation

## 2012-05-11 DIAGNOSIS — Z8781 Personal history of (healed) traumatic fracture: Secondary | ICD-10-CM | POA: Insufficient documentation

## 2012-05-11 DIAGNOSIS — N471 Phimosis: Secondary | ICD-10-CM | POA: Insufficient documentation

## 2012-05-11 DIAGNOSIS — N472 Paraphimosis: Secondary | ICD-10-CM

## 2012-05-11 DIAGNOSIS — N478 Other disorders of prepuce: Secondary | ICD-10-CM | POA: Insufficient documentation

## 2012-05-11 HISTORY — DX: Fracture of neck, unspecified, initial encounter: S12.9XXA

## 2012-05-11 HISTORY — DX: Congenital malformation of urinary system, unspecified: Q64.9

## 2012-05-11 NOTE — ED Notes (Signed)
Urinary catheter is leaking.  Was last changed yesterday.

## 2012-05-11 NOTE — ED Provider Notes (Signed)
History     CSN: [id number]  Arrival date & time 05/11/12  1344   First MD Initiated Contact with Patient 05/11/12 1345      Chief Complaint  Patient presents with  . catheter leaking     (Consider location/radiation/quality/duration/timing/severity/associated sxs/prior treatment) HPI The patient presents with concerns over leakage evident a Foley catheter.  He has a chronic Foley catheter, for greater than 1 year.  The catheter was changed yesterday by his urologist in [facility].  Since that time he has a leaking about the catheter, with soiling of numerous pads.  He denies other focal complaints, including fever, chills, abdominal pain, nausea, diarrhea, dysuria.  Past Medical History  Diagnosis Date  . Urinary anomaly   . Neck fracture     History reviewed. No pertinent past surgical history.  No family history on file.  History  Substance Use Topics  . Smoking status: Not on file  . Smokeless tobacco: Not on file  . Alcohol Use:       Review of Systems  All other systems reviewed and are negative.    Allergies  Review of patient's allergies indicates no known allergies.  Home Medications   Current Outpatient Rx  Name  Route  Sig  Dispense  Refill  . BISACODYL 5 MG PO TBEC   Oral   Take 5 mg by mouth daily as needed.         Marland Kitchen GABAPENTIN 100 MG PO CAPS   Oral   Take 100 mg by mouth 3 (three) times daily.         Marland Kitchen METHADONE HCL 10 MG PO TABS   Oral   Take 10 mg by mouth every 8 (eight) hours.         . SORBITOL 70 % PO SOLN   Oral   Take 15 mLs by mouth daily as needed.           BP 167/94  Pulse 68  Temp 98 F (36.7 C) (Oral)  Resp 18  Ht 5\' 11"  (1.803 m)  Wt 230 lb (104.327 kg)  BMI 32.08 kg/m2  SpO2 98%  Physical Exam  Nursing note and vitals reviewed. Constitutional: He is oriented to person, place, and time. He appears well-developed. No distress.  HENT:  Head: Normocephalic and atraumatic.  Eyes: Conjunctivae normal  and EOM are normal.  Cardiovascular: Normal rate and regular rhythm.   Pulmonary/Chest: Effort normal. No stridor. No respiratory distress.  Abdominal: He exhibits no distension.  Genitourinary: Testes normal. Uncircumcised. Paraphimosis, penile erythema and penile tenderness present. No discharge found.       About the glans penis there is notable dilation of the urethral meatus, with a Foley catheter exiting inferiorly, as in hypospadias.  Following retraction of the foreskin to evaluate the of the catheter was in a false passage, which the patient states has happened previously, the foreskin became entrapped.    Musculoskeletal: He exhibits no edema.  Neurological: He is alert and oriented to person, place, and time.  Skin: Skin is warm and dry.  Psychiatric: He has a normal mood and affect.    ED Course  Procedures (including critical care time)  Labs Reviewed - No data to display No results found.   1. Paraphimosis       MDM  This very pleasant elderly male presents due to concerns over a malfunction of his chronic indwelling Foley catheter.  On exam he is in no distress.  The patient's catheter had  a scant amount of clear fluid in the tubing, though his diaper was soaked.  On exam to evaluate for false passage placement, the patient's foreskin was not reducible.  Several attempts to reduce the foreskin were unsuccessful.  I discussed this with his urologic team.  Given the paraphimosis the patient will be transferred to the [facility] emergency department for further evaluation by his urology team.  Dr. Jean Rosenthal (ED) and Dr. Juliann Pares (Uro) accept the patient in transfer.     Gerhard Munch, MD 05/11/12 1547

## 2013-07-24 DIAGNOSIS — N319 Neuromuscular dysfunction of bladder, unspecified: Secondary | ICD-10-CM | POA: Diagnosis not present

## 2013-08-12 DIAGNOSIS — L0291 Cutaneous abscess, unspecified: Secondary | ICD-10-CM | POA: Diagnosis not present

## 2013-08-12 DIAGNOSIS — L0390 Cellulitis, unspecified: Secondary | ICD-10-CM | POA: Diagnosis not present

## 2014-01-23 ENCOUNTER — Other Ambulatory Visit (HOSPITAL_COMMUNITY): Payer: Self-pay | Admitting: Internal Medicine

## 2014-01-23 ENCOUNTER — Ambulatory Visit (HOSPITAL_COMMUNITY): Payer: Medicare Other | Attending: Internal Medicine | Admitting: Radiology

## 2014-01-23 DIAGNOSIS — R011 Cardiac murmur, unspecified: Secondary | ICD-10-CM | POA: Insufficient documentation

## 2014-01-23 NOTE — Progress Notes (Signed)
Echocardiogram performed.  

## 2014-04-28 DIAGNOSIS — E559 Vitamin D deficiency, unspecified: Secondary | ICD-10-CM | POA: Diagnosis not present

## 2014-04-28 DIAGNOSIS — R609 Edema, unspecified: Secondary | ICD-10-CM | POA: Diagnosis not present

## 2014-04-28 DIAGNOSIS — N390 Urinary tract infection, site not specified: Secondary | ICD-10-CM | POA: Diagnosis not present

## 2014-04-28 DIAGNOSIS — R5383 Other fatigue: Secondary | ICD-10-CM | POA: Diagnosis not present

## 2014-04-28 DIAGNOSIS — E669 Obesity, unspecified: Secondary | ICD-10-CM | POA: Diagnosis not present

## 2014-04-28 DIAGNOSIS — G8929 Other chronic pain: Secondary | ICD-10-CM | POA: Diagnosis not present

## 2014-04-28 DIAGNOSIS — K5900 Constipation, unspecified: Secondary | ICD-10-CM | POA: Diagnosis not present

## 2014-04-28 DIAGNOSIS — Z9289 Personal history of other medical treatment: Secondary | ICD-10-CM | POA: Diagnosis not present

## 2014-04-28 DIAGNOSIS — Z125 Encounter for screening for malignant neoplasm of prostate: Secondary | ICD-10-CM | POA: Diagnosis not present

## 2014-05-08 DIAGNOSIS — Z01118 Encounter for examination of ears and hearing with other abnormal findings: Secondary | ICD-10-CM | POA: Diagnosis not present

## 2014-05-08 DIAGNOSIS — E785 Hyperlipidemia, unspecified: Secondary | ICD-10-CM | POA: Diagnosis not present

## 2014-05-08 DIAGNOSIS — E039 Hypothyroidism, unspecified: Secondary | ICD-10-CM | POA: Diagnosis not present

## 2014-05-08 DIAGNOSIS — Z0000 Encounter for general adult medical examination without abnormal findings: Secondary | ICD-10-CM | POA: Diagnosis not present

## 2014-05-08 DIAGNOSIS — R7309 Other abnormal glucose: Secondary | ICD-10-CM | POA: Diagnosis not present

## 2014-05-08 DIAGNOSIS — Z1389 Encounter for screening for other disorder: Secondary | ICD-10-CM | POA: Diagnosis not present

## 2014-05-08 DIAGNOSIS — R972 Elevated prostate specific antigen [PSA]: Secondary | ICD-10-CM | POA: Diagnosis not present

## 2014-05-08 DIAGNOSIS — E559 Vitamin D deficiency, unspecified: Secondary | ICD-10-CM | POA: Diagnosis not present

## 2014-06-03 DIAGNOSIS — N390 Urinary tract infection, site not specified: Secondary | ICD-10-CM | POA: Diagnosis not present

## 2014-06-03 DIAGNOSIS — R972 Elevated prostate specific antigen [PSA]: Secondary | ICD-10-CM | POA: Diagnosis not present

## 2014-06-16 DIAGNOSIS — R609 Edema, unspecified: Secondary | ICD-10-CM | POA: Diagnosis not present

## 2014-06-16 DIAGNOSIS — K5900 Constipation, unspecified: Secondary | ICD-10-CM | POA: Diagnosis not present

## 2014-06-16 DIAGNOSIS — E669 Obesity, unspecified: Secondary | ICD-10-CM | POA: Diagnosis not present

## 2014-06-16 DIAGNOSIS — E559 Vitamin D deficiency, unspecified: Secondary | ICD-10-CM | POA: Diagnosis not present

## 2014-06-16 DIAGNOSIS — E785 Hyperlipidemia, unspecified: Secondary | ICD-10-CM | POA: Diagnosis not present

## 2014-06-16 DIAGNOSIS — E039 Hypothyroidism, unspecified: Secondary | ICD-10-CM | POA: Diagnosis not present

## 2014-06-16 DIAGNOSIS — R972 Elevated prostate specific antigen [PSA]: Secondary | ICD-10-CM | POA: Diagnosis not present

## 2014-06-16 DIAGNOSIS — R7309 Other abnormal glucose: Secondary | ICD-10-CM | POA: Diagnosis not present

## 2014-09-18 DIAGNOSIS — Z9289 Personal history of other medical treatment: Secondary | ICD-10-CM | POA: Diagnosis not present

## 2014-09-18 DIAGNOSIS — E785 Hyperlipidemia, unspecified: Secondary | ICD-10-CM | POA: Diagnosis not present

## 2014-09-18 DIAGNOSIS — R972 Elevated prostate specific antigen [PSA]: Secondary | ICD-10-CM | POA: Diagnosis not present

## 2014-09-18 DIAGNOSIS — R7309 Other abnormal glucose: Secondary | ICD-10-CM | POA: Diagnosis not present

## 2014-09-18 DIAGNOSIS — R609 Edema, unspecified: Secondary | ICD-10-CM | POA: Diagnosis not present

## 2014-09-18 DIAGNOSIS — E559 Vitamin D deficiency, unspecified: Secondary | ICD-10-CM | POA: Diagnosis not present

## 2014-09-18 DIAGNOSIS — E669 Obesity, unspecified: Secondary | ICD-10-CM | POA: Diagnosis not present

## 2014-09-18 DIAGNOSIS — E039 Hypothyroidism, unspecified: Secondary | ICD-10-CM | POA: Diagnosis not present

## 2014-09-18 DIAGNOSIS — K5900 Constipation, unspecified: Secondary | ICD-10-CM | POA: Diagnosis not present

## 2014-10-05 ENCOUNTER — Other Ambulatory Visit: Payer: Self-pay | Admitting: *Deleted

## 2014-10-05 NOTE — Patient Outreach (Signed)
[facility] Care Management  10/05/2014  Brandon Franklin [birth year] [id number]    RN covering for [name], NP. RN spoke with the primary caregiver (spouse) who indicates pt is doing well with no problems. States pt will be visiting his urologist tomorrow for his ongoing catheter change. RN offered any other assistance and provide contact information if [facility] services would be needed in the absence of [name], NP. Spouse very appreciative and grateful for the call today. No other inquires or question at this time. Will continue to follow up accordingly in Ryerson IncCarroll Spinks absence.   Elliot CousinLisa Matthews, RN Care Management Coordinator [facility] Main Office [phone]

## 2014-10-06 DIAGNOSIS — R972 Elevated prostate specific antigen [PSA]: Secondary | ICD-10-CM | POA: Diagnosis not present

## 2014-10-06 DIAGNOSIS — N319 Neuromuscular dysfunction of bladder, unspecified: Secondary | ICD-10-CM | POA: Diagnosis not present

## 2014-10-06 DIAGNOSIS — N390 Urinary tract infection, site not specified: Secondary | ICD-10-CM | POA: Diagnosis not present

## 2014-10-13 ENCOUNTER — Ambulatory Visit: Payer: Self-pay | Admitting: *Deleted

## 2014-10-16 ENCOUNTER — Other Ambulatory Visit: Payer: Self-pay | Admitting: *Deleted

## 2014-10-16 NOTE — Patient Outreach (Signed)
[facility] Care Management  10/16/2014  Brandon Franklin [birth year] [id number]   Coverage for [name] RN spoke with pt's wife who indicates pt has been doing well with no major issues.  Wife verified pt has visited his urologist for a change out on his catheter with no reported problems or issues since changed. RN will remain available and has informed pt to contact RN if additional services or resources are needed.   Elliot CousinLisa Freada Twersky, RN Care Management Coordinator [facility] Main Office [phone]

## 2014-11-06 ENCOUNTER — Encounter: Payer: Self-pay | Admitting: *Deleted

## 2014-11-06 ENCOUNTER — Other Ambulatory Visit: Payer: Self-pay | Admitting: *Deleted

## 2014-11-06 NOTE — Patient Outreach (Signed)
[facility] Care Management  11/06/2014  Brandon RancherVernon Babich 1925-09-07 [id number]   Pt and wife advised today that my role is changing and that I am closing Mr.Chill's case today.  I have advised them that I feel that Mrs. Kanady can administer his B12 injection monthly and I have instructed her on the procedure and she performed the injection today.  I have provided 12 more syringes for the coming year for these injections. I instructed Mrs. Yasin if she needs me to support her on the next injection, she can call me and I will walk her through the procedure.  We discussed the plan of care for his future catheter changes. Mr and Mrs. Lookabaugh will go to the urologist office for the procedure every 4 weeks. Today, I did discontinue the current suprapubic catheter and inserted a new 20 french/5cc balloon foley catheter into his suprapubic site. Today's insertion was rather difficult and it took me multiple attempts and changing of the pt's position to insert. The pt did tolerate the procedure well despite the difficulty. There was immediate return of very light yellow urine which was blood tinged and he had some bleeding at the ostomy site.  Mrs. Able prefers the catheter set ups I use and would like me to see what the cost would be to mail order this type and they would take it to the office for the catheter change.  I debrided Mr. Priscella MannYork's, long, onychomycotic toenails today. I believe that their daughter who is a nurse can provide this service to him. He is not a diabetic and not on anticoagulation.  I provided the LAST RX of METHADONE today, that I will prescribe. Pt is doing well on current tapered dose and we discussed continuing on to try to taper further with the hopes of discontinuing the med altogether. The new Rx was for Methadone 5mg  1/2 tab = 2.5 mg 6a, 2p and 1 tab at hs.  It has been a pleasure serving this couple for the last 3 years. I will miss seeing them.  Almetta LovelyCarroll Chadrick Sprinkle  [facility] Joseph HospitalGNP-BC [facility] Care Manager [phone]

## 2014-11-09 NOTE — Patient Outreach (Signed)
[facility]) Care Management  11/09/2014  Zylan Almquist 03/10/1926 [id number]   Received notification from Deloria Lair, NP to close case as patient has met all goals with [facility] Care Management.  Ronnell Freshwater. [place] CM Assistant Phone: [phone] Fax: [phone]

## 2014-11-27 DIAGNOSIS — N390 Urinary tract infection, site not specified: Secondary | ICD-10-CM | POA: Diagnosis not present

## 2014-11-27 DIAGNOSIS — N319 Neuromuscular dysfunction of bladder, unspecified: Secondary | ICD-10-CM | POA: Diagnosis not present

## 2015-01-07 DIAGNOSIS — N390 Urinary tract infection, site not specified: Secondary | ICD-10-CM | POA: Diagnosis not present

## 2015-01-14 DIAGNOSIS — N390 Urinary tract infection, site not specified: Secondary | ICD-10-CM | POA: Diagnosis not present

## 2015-01-14 DIAGNOSIS — N319 Neuromuscular dysfunction of bladder, unspecified: Secondary | ICD-10-CM | POA: Diagnosis not present

## 2015-02-09 DIAGNOSIS — N3942 Incontinence without sensory awareness: Secondary | ICD-10-CM | POA: Diagnosis not present

## 2015-02-09 DIAGNOSIS — N319 Neuromuscular dysfunction of bladder, unspecified: Secondary | ICD-10-CM | POA: Diagnosis not present

## 2015-02-09 DIAGNOSIS — N390 Urinary tract infection, site not specified: Secondary | ICD-10-CM | POA: Diagnosis not present

## 2015-02-12 ENCOUNTER — Other Ambulatory Visit: Payer: Self-pay | Admitting: *Deleted

## 2015-02-12 DIAGNOSIS — E559 Vitamin D deficiency, unspecified: Secondary | ICD-10-CM | POA: Diagnosis not present

## 2015-02-12 DIAGNOSIS — E785 Hyperlipidemia, unspecified: Secondary | ICD-10-CM | POA: Diagnosis not present

## 2015-02-12 DIAGNOSIS — K5900 Constipation, unspecified: Secondary | ICD-10-CM | POA: Diagnosis not present

## 2015-02-12 DIAGNOSIS — R972 Elevated prostate specific antigen [PSA]: Secondary | ICD-10-CM | POA: Diagnosis not present

## 2015-02-12 DIAGNOSIS — R7309 Other abnormal glucose: Secondary | ICD-10-CM | POA: Diagnosis not present

## 2015-02-12 DIAGNOSIS — E039 Hypothyroidism, unspecified: Secondary | ICD-10-CM | POA: Diagnosis not present

## 2015-02-12 DIAGNOSIS — R609 Edema, unspecified: Secondary | ICD-10-CM | POA: Diagnosis not present

## 2015-02-12 DIAGNOSIS — Z9289 Personal history of other medical treatment: Secondary | ICD-10-CM | POA: Diagnosis not present

## 2015-02-12 DIAGNOSIS — E669 Obesity, unspecified: Secondary | ICD-10-CM | POA: Diagnosis not present

## 2015-02-12 NOTE — Patient Outreach (Signed)
Pt daughter called reporting problem with her father getting his methadone for chronic pain management that he has been on for 3 years. His primary care provider has advised him to go to a methadone clinic which is not desirable for this pt as the med was originally prescribed by the Hospice MD for control of pain related to his cervical spondylosis.   I had discharged the pt in May as this way the only intervention I was doing and I advised his primary care provider that he would need to do this as I was not going to be seeing him any longer. We had tapered down his dose of the methadone to 2.5 mg tid as wife reports to me today.  I conferred with Dr.Osborne, my supervising MD and we propose a quarterly meeting with the pt to provide for this prescription if we cannot switch him to another medication that is less problelmatic to acquire.  I will request pharmacy guidance for this change. I will propose change to hydrocodone/apap 5/325 mg prn. My main concern is to avoid withdrawal sxs.  I will see the pt on Tuesday, Aug 16th.  Almetta Lovely [facility] Care Manager [phone]

## 2015-02-16 ENCOUNTER — Other Ambulatory Visit: Payer: Self-pay | Admitting: *Deleted

## 2015-02-16 ENCOUNTER — Encounter: Payer: Self-pay | Admitting: *Deleted

## 2015-02-16 NOTE — Patient Outreach (Addendum)
[facility]) Care Management   02/16/2015  Katherine Syme [birth year] [id number]  Brandon Franklin is an 81 y.o. male  Subjective:   Objective:   Review of Systems  Constitutional: Negative.   Eyes: Negative.   Respiratory: Negative.   Cardiovascular: Negative.   Gastrointestinal: Negative.   Genitourinary:       Has suprapubic catheter 20 french 10 cc balloon.  Musculoskeletal: Positive for falls and neck pain.  Skin: Negative.   Neurological: Negative.   Endo/Heme/Allergies: Negative.   Psychiatric/Behavioral: Negative.    BP 100/60 mmHg  Pulse 63  Resp 18  Wt 248 lb (112.492 kg)  SpO2 95%  Physical Exam  Constitutional: He appears well-developed and well-nourished.  Neck:  Neck is stiff.  Cardiovascular: Normal rate, regular rhythm and normal heart sounds.   Respiratory: Effort normal and breath sounds normal.  GI: Soft. Bowel sounds are normal.  Musculoskeletal:  Pt is chair or bed bound. He only stands to transfer. He is not walking any distance because he and wife are afraid he will fall.  Skin: Skin is warm and dry.  Psychiatric: He has a normal mood and affect.    Current Medications:   Current Outpatient Prescriptions  Medication Sig Dispense Refill  . cholecalciferol (VITAMIN D) 1000 UNITS tablet Take 1,000 Units by mouth daily.    . furosemide (LASIX) 40 MG tablet Take 40 mg by mouth daily.    Marland Kitchen levothyroxine (SYNTHROID, LEVOTHROID) 25 MCG tablet Take 25 mcg by mouth daily before breakfast.    . methadone (DOLOPHINE) 10 MG tablet Take 10 mg by mouth every 8 (eight) hours.    . methadone (DOLOPHINE) 5 MG tablet Take 5 mg by mouth every 8 (eight) hours.    Marland Kitchen oxybutynin (DITROPAN) 5 MG tablet Take 5 mg by mouth 3 (three) times daily.    Marland Kitchen senna-docusate (SENOKOT-S) 8.6-50 MG per tablet Take 4 tablets by mouth 2 (two) times daily.    . bisacodyl (DULCOLAX) 5 MG EC tablet Take 5 mg by mouth daily as needed.    . gabapentin (NEURONTIN) 100 MG  capsule Take 100 mg by mouth 3 (three) times daily.    . sorbitol 70 % solution Take 15 mLs by mouth daily as needed.     No current facility-administered medications for this visit.    Functional Status:   In your present state of health, do you have any difficulty performing the following activities: 02/16/2015 11/06/2014  Hearing? N N  Vision? N N  Difficulty concentrating or making decisions? N N  Walking or climbing stairs? Y Y  Dressing or bathing? Y Y  Doing errands, shopping? Brandon Franklin  Preparing Food and eating ? Y Y  Using the Toilet? Y Y  In the past six months, have you accidently leaked urine? Y Y  Do you have problems with loss of bowel control? Y N  Managing your Medications? Y Y  Managing your Finances? Brandon Franklin  Housekeeping or managing your Housekeeping? Brandon Franklin    Fall/Depression Screening:    PHQ 2/9 Scores 02/16/2015 11/06/2014  PHQ - 2 Score 0 2   Fall Risk  02/16/2015 11/06/2014  Falls in the past year? Yes Yes  Number falls in past yr: 2 or more 2 or more  Injury with Fall? No Yes  Risk Factor Category  High Fall Risk -  Risk for fall due to : History of fall(s);Impaired balance/gait;Impaired mobility History of fall(s);Impaired balance/gait;Impaired mobility  Follow up Falls evaluation  completed;Education provided;Falls prevention discussed Falls evaluation completed;Education provided;Falls prevention discussed    Assessment:  Severe cervical spondolytic myelopathy  Plan: Resuming [facility] Care Management for Pain Management.  [facility] CM Care Plan Problem One        Patient Outreach from 02/16/2015 in [facility] [employer] [facility]   Care Plan Problem One  Pt needs a provider that will prescribe his Methadone.   Care Plan for Problem One  Active   [facility] Long Term Goal (31-90 days)  Pt/wife to call new provider suggested to establish care sometime in the next 3 montns.   [facility] Long Term Goal Start Date  02/16/15   Interventions for Problem One Long Term Goal  Provided Rx for pain  management for 3 months. Discussed options to change provider. Networked with [facility] pharmacist and [facility] MD for solutions.    [facility] CM Care Plan Problem Two        Patient Outreach from 02/16/2015 in [facility]   Care Plan Problem Two  High risk for falls and injury.   Care Plan for Problem Two  Active   [facility] CM Short Term Goal #1 (0-30 days)  Pt will not fall in the next 30 days.   [facility] CM Short Term Goal #1 Start Date  02/16/15   Interventions for Short Term Goal #2   Reviewed safety precautions.     Almetta Lovely GNP-BC [facility] Care Manager [phone]  I did not put the Subjective note above: Pt daughter called me stating her father had a problem, that he could not get his pain medication from the provider I referred him to. Provider had told him to go to the methadone clinic. It is felt that this is definitely not appropriate for him as he was prescribed this medication initially by the Hospice MD 6 years ago. I offered to resume home visits until another provider can be located that will agree to assist him.  Almetta Lovely [facility] Care Manager [phone]

## 2015-02-17 ENCOUNTER — Other Ambulatory Visit: Payer: Self-pay | Admitting: *Deleted

## 2015-03-17 DIAGNOSIS — N319 Neuromuscular dysfunction of bladder, unspecified: Secondary | ICD-10-CM | POA: Diagnosis not present

## 2015-03-24 ENCOUNTER — Encounter: Payer: Self-pay | Admitting: *Deleted

## 2015-03-24 ENCOUNTER — Telehealth: Payer: Self-pay | Admitting: *Deleted

## 2015-03-24 NOTE — Telephone Encounter (Signed)
Pre-Visit Call completed with patient and chart updated.   Pre-Visit Info documented in Specialty Comments under [employer].    

## 2015-03-26 ENCOUNTER — Encounter: Payer: Self-pay | Admitting: Family Medicine

## 2015-03-26 ENCOUNTER — Ambulatory Visit (INDEPENDENT_AMBULATORY_CARE_PROVIDER_SITE_OTHER): Payer: Medicare Other | Admitting: Family Medicine

## 2015-03-26 VITALS — BP 122/78 | HR 63 | Temp 97.7°F | Ht 68.0 in | Wt 248.0 lb

## 2015-03-26 DIAGNOSIS — Q649 Congenital malformation of urinary system, unspecified: Secondary | ICD-10-CM

## 2015-03-26 DIAGNOSIS — E079 Disorder of thyroid, unspecified: Secondary | ICD-10-CM | POA: Diagnosis not present

## 2015-03-26 DIAGNOSIS — S12100S Unspecified displaced fracture of second cervical vertebra, sequela: Secondary | ICD-10-CM

## 2015-03-26 DIAGNOSIS — K5900 Constipation, unspecified: Secondary | ICD-10-CM | POA: Diagnosis not present

## 2015-03-26 DIAGNOSIS — E669 Obesity, unspecified: Secondary | ICD-10-CM

## 2015-03-26 DIAGNOSIS — S12100A Unspecified displaced fracture of second cervical vertebra, initial encounter for closed fracture: Secondary | ICD-10-CM

## 2015-03-26 DIAGNOSIS — E538 Deficiency of other specified B group vitamins: Secondary | ICD-10-CM

## 2015-03-26 DIAGNOSIS — I509 Heart failure, unspecified: Secondary | ICD-10-CM

## 2015-03-26 DIAGNOSIS — M545 Low back pain: Secondary | ICD-10-CM

## 2015-03-26 LAB — COMPREHENSIVE METABOLIC PANEL
ALBUMIN: 3.4 g/dL — AB (ref 3.5–5.2)
ALK PHOS: 52 U/L (ref 39–117)
ALT: 4 U/L (ref 0–53)
AST: 8 U/L (ref 0–37)
BILIRUBIN TOTAL: 0.5 mg/dL (ref 0.2–1.2)
BUN: 13 mg/dL (ref 6–23)
CO2: 30 mEq/L (ref 19–32)
Calcium: 8.7 mg/dL (ref 8.4–10.5)
Chloride: 103 mEq/L (ref 96–112)
Creatinine, Ser: 0.79 mg/dL (ref 0.40–1.50)
GFR: 98.48 mL/min (ref >60.00)
GLUCOSE: 106 mg/dL — AB (ref 70–99)
Potassium: 3.8 mEq/L (ref 3.5–5.1)
Sodium: 138 mEq/L (ref 135–145)
TOTAL PROTEIN: 6.8 g/dL (ref 6.0–8.3)

## 2015-03-26 LAB — TSH: TSH: 4.47 u[IU]/mL (ref 0.35–4.50)

## 2015-03-26 LAB — CBC
HCT: 39.1 % (ref 39.0–52.0)
HEMOGLOBIN: 12.9 g/dL — AB (ref 13.0–17.0)
MCHC: 33.1 g/dL (ref 30.0–36.0)
MCV: 89.6 fl (ref 78.0–100.0)
PLATELETS: 164 10*3/uL (ref 150.0–400.0)
RBC: 4.36 Mil/uL (ref 4.22–5.81)
RDW: 15.2 % (ref 11.5–15.5)
WBC: 4.4 10*3/uL (ref 4.0–10.5)

## 2015-03-26 MED ORDER — CYANOCOBALAMIN 1000 MCG/ML IJ SOLN: 1000.0000 ug | INTRAMUSCULAR | Status: DC

## 2015-03-26 MED ORDER — METHADONE HCL 5 MG PO TABS: 2.5000 mg | ORAL_TABLET | Freq: Three times a day (TID) | ORAL | Status: DC

## 2015-03-26 NOTE — Progress Notes (Signed)
Pre visit review using our clinic review tool, if applicable. No additional management support is needed unless otherwise documented below in the visit note. 

## 2015-03-26 NOTE — Patient Instructions (Addendum)
Salon pas gel or patches Aspercreme or new patch with Lidocaine  Hypertension Hypertension, commonly called high blood pressure, is when the force of blood pumping through your arteries is too strong. Your arteries are the blood vessels that carry blood from your heart throughout your body. A blood pressure reading consists of a higher number over a lower number, such as 110/72. The higher number (systolic) is the pressure inside your arteries when your heart pumps. The lower number (diastolic) is the pressure inside your arteries when your heart relaxes. Ideally you want your blood pressure below 120/80. Hypertension forces your heart to work harder to pump blood. Your arteries may become narrow or stiff. Having hypertension puts you at risk for heart disease, stroke, and other problems.  RISK FACTORS Some risk factors for high blood pressure are controllable. Others are not.  Risk factors you cannot control include:   Race. You may be at higher risk if you are African American.  Age. Risk increases with age.  Gender. Men are at higher risk than women before age 39 years. After age 54, women are at higher risk than men. Risk factors you can control include:  Not getting enough exercise or physical activity.  Being overweight.  Getting too much fat, sugar, calories, or salt in your diet.  Drinking too much alcohol. SIGNS AND SYMPTOMS Hypertension does not usually cause signs or symptoms. Extremely high blood pressure (hypertensive crisis) may cause headache, anxiety, shortness of breath, and nosebleed. DIAGNOSIS  To check if you have hypertension, your health care provider will measure your blood pressure while you are seated, with your arm held at the level of your heart. It should be measured at least twice using the same arm. Certain conditions can cause a difference in blood pressure between your right and left arms. A blood pressure reading that is higher than normal on one occasion  does not mean that you need treatment. If one blood pressure reading is high, ask your health care provider about having it checked again. TREATMENT  Treating high blood pressure includes making lifestyle changes and possibly taking medicine. Living a healthy lifestyle can help lower high blood pressure. You may need to change some of your habits. Lifestyle changes may include:  Following the DASH diet. This diet is high in fruits, vegetables, and whole grains. It is low in salt, red meat, and added sugars.  Getting at least 2 hours of brisk physical activity every week.  Losing weight if necessary.  Not smoking.  Limiting alcoholic beverages.  Learning ways to reduce stress. If lifestyle changes are not enough to get your blood pressure under control, your health care provider may prescribe medicine. You may need to take more than one. Work closely with your health care provider to understand the risks and benefits. HOME CARE INSTRUCTIONS  Have your blood pressure rechecked as directed by your health care provider.   Take medicines only as directed by your health care provider. Follow the directions carefully. Blood pressure medicines must be taken as prescribed. The medicine does not work as well when you skip doses. Skipping doses also puts you at risk for problems.   Do not smoke.   Monitor your blood pressure at home as directed by your health care provider. SEEK MEDICAL CARE IF:   You think you are having a reaction to medicines taken.  You have recurrent headaches or feel dizzy.  You have swelling in your ankles.  You have trouble with your vision. SEEK  IMMEDIATE MEDICAL CARE IF:  You develop a severe headache or confusion.  You have unusual weakness, numbness, or feel faint.  You have severe chest or abdominal pain.  You vomit repeatedly.  You have trouble breathing. MAKE SURE YOU:   Understand these instructions.  Will watch your condition.  Will get  help right away if you are not doing well or get worse. Document Released: 06/19/2005 Document Revised: 11/03/2013 Document Reviewed: 04/11/2013 [facility] Patient Information 2015 [place], [place]. This information is not intended to replace advice given to you by your health care provider. Make sure you discuss any questions you have with your health care provider.

## 2015-03-26 NOTE — Progress Notes (Signed)
Patient ID: Brandon Franklin, male   DOB: 1925/11/21, 81 y.o.   MRN: [id number]   Subjective:    Patient ID: Brandon Franklin, male    DOB: 1926-04-14, 81 y.o.   MRN: [id number]  Chief Complaint  Patient presents with  . Establish Care    HPI Patient is in today for new patient appointment. He is accompanied by his wife and 2 daughters. He has previously been on hospice after suffering a very bad fracture in his cervical spine. He fractured C1 and C2 and was given a very poor prognosis. He was with hospice and on methadone for quite some time but has lived much longer than anyone expected. At present he is doing very well. He is using minimal methadone and we were contacted [facility] in hopes that we would accept him as a new patient. Other than his pain which is improving he is doing well. No recent illness. He does have a urostomy tube and also a history of vitamin B12 deficiency, congestive heart failure, arthritis and allergies. Denies CP/palp/SOB/HA/congestion/fevers/GI or GU c/o. Taking meds as prescribed  Past Medical History  Diagnosis Date  . Urinary anomaly   . Neck fracture   . Arthritis   . CHF (congestive heart failure)   . Depression   . Neuromuscular disorder   . Allergy     seasonal    Past Surgical History  Procedure Laterality Date  . Insertion of suprapubic catheter    . Surgery scrotal / testicular Right     Family History  Problem Relation Age of Onset  . Cancer Sister     Social History   Social History  . Marital Status: Married    Spouse Name: N/A  . Number of Children: N/A  . Years of Education: N/A   Occupational History  . retired    Social History Main Topics  . Smoking status: Never Smoker   . Smokeless tobacco: Not on file  . Alcohol Use: No  . Drug Use: No  . Sexual Activity: No   Other Topics Concern  . Not on file   Social History Narrative    Outpatient Prescriptions Prior to Visit  Medication Sig Dispense Refill  .  bisacodyl (DULCOLAX) 5 MG EC tablet Take 5 mg by mouth daily as needed.    . cholecalciferol (VITAMIN D) 1000 UNITS tablet Take 1,000 Units by mouth daily.    . furosemide (LASIX) 40 MG tablet Take 40 mg by mouth daily.    Marland Kitchen gabapentin (NEURONTIN) 100 MG capsule Take 300 mg by mouth 3 (three) times daily.     Marland Kitchen levothyroxine (SYNTHROID, LEVOTHROID) 25 MCG tablet Take 25 mcg by mouth daily before breakfast.    . oxybutynin (DITROPAN) 5 MG tablet Take 10 mg by mouth daily.     Marland Kitchen senna-docusate (SENOKOT-S) 8.6-50 MG per tablet Take 4 tablets by mouth 2 (two) times daily.    . sorbitol 70 % solution Take 15 mLs by mouth daily as needed.    . methadone (DOLOPHINE) 10 MG tablet Take 10 mg by mouth every 8 (eight) hours.    . methadone (DOLOPHINE) 5 MG tablet Take 5 mg by mouth every 8 (eight) hours.     No facility-administered medications prior to visit.    Allergies  Allergen Reactions  . Ciprofloxacin     Review of Systems  Constitutional: Positive for malaise/fatigue. Negative for fever and chills.  HENT: Negative for congestion and hearing loss.   Eyes:  Negative for discharge.  Respiratory: Negative for cough, sputum production and shortness of breath.   Cardiovascular: Negative for chest pain, palpitations and leg swelling.  Gastrointestinal: Negative for heartburn, nausea, vomiting, abdominal pain, diarrhea, constipation and blood in stool.  Genitourinary: Negative for dysuria, urgency, frequency and hematuria.  Musculoskeletal: Positive for neck pain. Negative for myalgias, back pain and falls.  Skin: Negative for rash.  Neurological: Positive for weakness. Negative for dizziness, sensory change, loss of consciousness and headaches.  Endo/Heme/Allergies: Negative for environmental allergies. Does not bruise/bleed easily.  Psychiatric/Behavioral: Negative for depression and suicidal ideas. The patient is not nervous/anxious and does not have insomnia.        Objective:      Physical Exam  Constitutional: He is oriented to person, place, and time. He appears well-developed and well-nourished. No distress.  HENT:  Head: Normocephalic and atraumatic.  Eyes: Conjunctivae are normal.  Neck: Neck supple. No thyromegaly present.  Cardiovascular: Normal rate and regular rhythm.   Murmur heard. Pulmonary/Chest: Effort normal and breath sounds normal. No respiratory distress. He has no wheezes.  Abdominal: Soft. Bowel sounds are normal. He exhibits no mass. There is no tenderness.  Musculoskeletal: He exhibits no edema.  Lymphadenopathy:    He has no cervical adenopathy.  Neurological: He is alert and oriented to person, place, and time.  Skin: Skin is warm and dry.  Psychiatric: He has a normal mood and affect. His behavior is normal.    BP 122/78 mmHg  Pulse 63  Temp(Src) 97.7 F (36.5 C) (Oral)  Ht  (1.727 m)  Wt 248 lb (112.492 kg)  BMI 37.72 kg/m2  SpO2 98% Wt Readings from Last 3 Encounters:  03/26/15 248 lb (112.492 kg)  02/16/15 248 lb (112.492 kg)  11/06/14 248 lb (112.492 kg)        Lab Results  Component Value Date   WBC 4.5 08/31/2008   HGB 12.3* 08/31/2008   HCT 36.8* 08/31/2008   MCV 88.5 08/31/2008   PLT 153 08/31/2008   Lab Results  Component Value Date   NA 138 08/31/2008   K 4.1 08/31/2008   CO2 25 08/31/2008   GLUCOSE 92 08/31/2008   BUN 29* 08/31/2008   CREATININE 0.81 08/31/2008   BILITOT 0.7 05/17/2008   ALKPHOS 45 05/17/2008   AST 16 05/17/2008   ALT 13 05/17/2008   PROT 6.4 05/17/2008   ALBUMIN 3.5 05/17/2008   CALCIUM 9.1 08/31/2008   No results found for: CHOL No results found for: HDL No results found for: LDLCALC No results found for: TRIG No results found for: CHOLHDL No results found for: ZOXW9U     Assessment & Plan:   Neck fracture (HCC) C1-C2 fracture several years ago was severe and his prognosis was poor. He was actually on Hospice for quite some time he has done much better than  anticipated and is here today with his family to establish with a new primary care doctor. He is doing well at this time. He has been on Methadone 2.5 mg tid after previously being on higher doses. He is encouraged to decrease to bid as tolerated and we will follow up once the records from his previous doctor show up. He is minimally mobile but still home with family  Urinary anomaly Suprapubic catheter in place s/p urologic surgery  Obesity Encouraged DASH diet, decrease po intake and increase exercise as tolerated. Needs 7-8 hours of sleep nightly. Avoid trans fats, eat small, frequent meals every 4-5 hours with  lean proteins, complex carbs and healthy fats. Minimize simple carbs, GMO foods.  CHF (congestive heart failure) (HCC) No recent exacerbation   I have discontinued Mr. Villena methadone and methadone. I have also changed his cyanocobalamin. Additionally, I am having him start on methadone. Lastly, I am having him maintain his gabapentin, sorbitol, bisacodyl, levothyroxine, cholecalciferol, oxybutynin, furosemide, and senna-docusate.  Meds ordered this encounter  Medications  . DISCONTD: cyanocobalamin (,VITAMIN B-12,) 1000 MCG/ML injection    Sig: Inject 1,000 mcg into the muscle every 30 (thirty) days.  . methadone (DOLOPHINE) 5 MG tablet    Sig: Take 0.5 tablets (2.5 mg total) by mouth every 8 (eight) hours.    Dispense:  45 tablet    Refill:  0  . cyanocobalamin (,VITAMIN B-12,) 1000 MCG/ML injection    Sig: Inject 1 mL (1,000 mcg total) into the muscle every 30 (thirty) days.    Dispense:  10 mL    Refill:  5     Baird Kay, LPN

## 2015-04-04 ENCOUNTER — Encounter: Payer: Self-pay | Admitting: Family Medicine

## 2015-04-04 DIAGNOSIS — S129XXA Fracture of neck, unspecified, initial encounter: Secondary | ICD-10-CM | POA: Insufficient documentation

## 2015-04-04 DIAGNOSIS — M1990 Unspecified osteoarthritis, unspecified site: Secondary | ICD-10-CM | POA: Insufficient documentation

## 2015-04-04 DIAGNOSIS — M545 Low back pain, unspecified: Secondary | ICD-10-CM

## 2015-04-04 DIAGNOSIS — E669 Obesity, unspecified: Secondary | ICD-10-CM

## 2015-04-04 DIAGNOSIS — E538 Deficiency of other specified B group vitamins: Secondary | ICD-10-CM

## 2015-04-04 DIAGNOSIS — T839XXA Unspecified complication of genitourinary prosthetic device, implant and graft, initial encounter: Secondary | ICD-10-CM | POA: Insufficient documentation

## 2015-04-04 DIAGNOSIS — T7840XA Allergy, unspecified, initial encounter: Secondary | ICD-10-CM | POA: Insufficient documentation

## 2015-04-04 DIAGNOSIS — I509 Heart failure, unspecified: Secondary | ICD-10-CM | POA: Insufficient documentation

## 2015-04-04 HISTORY — DX: Obesity, unspecified: E66.9

## 2015-04-04 HISTORY — DX: Low back pain, unspecified: M54.50

## 2015-04-04 HISTORY — DX: Deficiency of other specified B group vitamins: E53.8

## 2015-04-04 NOTE — Assessment & Plan Note (Signed)
Encouraged DASH diet, decrease po intake and increase exercise as tolerated. Needs 7-8 hours of sleep nightly. Avoid trans fats, eat small, frequent meals every 4-5 hours with lean proteins, complex carbs and healthy fats. Minimize simple carbs, GMO foods. 

## 2015-04-04 NOTE — Assessment & Plan Note (Signed)
C1-C2 fracture several years ago was severe and his prognosis was poor. He was actually on Hospice for quite some time he has done much better than anticipated and is here today with his family to establish with a new primary care doctor. He is doing well at this time. He has been on Methadone 2.5 mg tid after previously being on higher doses. He is encouraged to decrease to bid as tolerated and we will follow up once the records from his previous doctor show up. He is minimally mobile but still home with family

## 2015-04-04 NOTE — Assessment & Plan Note (Signed)
No recent exacerbation 

## 2015-04-04 NOTE — Assessment & Plan Note (Signed)
Suprapubic catheter in place s/p urologic surgery

## 2015-04-13 ENCOUNTER — Telehealth: Payer: Self-pay | Admitting: Family Medicine

## 2015-04-13 MED ORDER — LEVOTHYROXINE SODIUM 25 MCG PO TABS: 25.0000 ug | ORAL_TABLET | Freq: Every day | ORAL | Status: DC

## 2015-04-13 NOTE — Telephone Encounter (Signed)
Called the patient left a detailes messge thyroid medication sent in.

## 2015-04-13 NOTE — Telephone Encounter (Signed)
Caller name: [name] Relationship to patient:WIFE Can be reached:[phone] Pharmacy:[facility] [address] IN [place]   Reason for call:RX FOR LEVOTHYROXIN TAB SAN  TAKE 1 PER DAY

## 2015-04-14 DIAGNOSIS — N319 Neuromuscular dysfunction of bladder, unspecified: Secondary | ICD-10-CM | POA: Diagnosis not present

## 2015-04-27 ENCOUNTER — Ambulatory Visit (INDEPENDENT_AMBULATORY_CARE_PROVIDER_SITE_OTHER): Payer: Medicare Other | Admitting: Family Medicine

## 2015-04-27 ENCOUNTER — Encounter: Payer: Self-pay | Admitting: Family Medicine

## 2015-04-27 VITALS — BP 124/92 | HR 63 | Temp 98.2°F | Ht 68.0 in | Wt 248.5 lb

## 2015-04-27 DIAGNOSIS — E538 Deficiency of other specified B group vitamins: Secondary | ICD-10-CM | POA: Diagnosis not present

## 2015-04-27 DIAGNOSIS — Q649 Congenital malformation of urinary system, unspecified: Secondary | ICD-10-CM | POA: Diagnosis not present

## 2015-04-27 DIAGNOSIS — E669 Obesity, unspecified: Secondary | ICD-10-CM

## 2015-04-27 DIAGNOSIS — I509 Heart failure, unspecified: Secondary | ICD-10-CM

## 2015-04-27 DIAGNOSIS — Z23 Encounter for immunization: Secondary | ICD-10-CM | POA: Diagnosis not present

## 2015-04-27 DIAGNOSIS — R739 Hyperglycemia, unspecified: Secondary | ICD-10-CM

## 2015-04-27 DIAGNOSIS — S12100A Unspecified displaced fracture of second cervical vertebra, initial encounter for closed fracture: Secondary | ICD-10-CM

## 2015-04-27 MED ORDER — CYANOCOBALAMIN 1000 MCG/ML IJ SOLN
1000.0000 ug | Freq: Once | INTRAMUSCULAR | Status: AC
  Administered 2015-04-27: 1000 ug via INTRAMUSCULAR

## 2015-04-27 MED ORDER — METHADONE HCL 5 MG PO TABS: 2.5000 mg | ORAL_TABLET | Freq: Three times a day (TID) | ORAL | Status: DC

## 2015-04-27 NOTE — Patient Instructions (Signed)
Anemia, Nonspecific Anemia is a condition in which the concentration of red blood cells or hemoglobin in the blood is below normal. Hemoglobin is a substance in red blood cells that carries oxygen to the tissues of the body. Anemia results in not enough oxygen reaching these tissues.  CAUSES  Common causes of anemia include:   Excessive bleeding. Bleeding may be internal or external. This includes excessive bleeding from periods (in women) or from the intestine.   Poor nutrition.   Chronic kidney, thyroid, and liver disease.  Bone marrow disorders that decrease red blood cell production.  Cancer and treatments for cancer.  HIV, AIDS, and their treatments.  Spleen problems that increase red blood cell destruction.  Blood disorders.  Excess destruction of red blood cells due to infection, medicines, and autoimmune disorders. SIGNS AND SYMPTOMS   Minor weakness.   Dizziness.   Headache.  Palpitations.   Shortness of breath, especially with exercise.   Paleness.  Cold sensitivity.  Indigestion.  Nausea.  Difficulty sleeping.  Difficulty concentrating. Symptoms may occur suddenly or they may develop slowly.  DIAGNOSIS  Additional blood tests are often needed. These help your health care provider determine the best treatment. Your health care provider will check your stool for blood and look for other causes of blood loss.  TREATMENT  Treatment varies depending on the cause of the anemia. Treatment can include:   Supplements of iron, vitamin B12, or folic acid.   Hormone medicines.   A blood transfusion. This may be needed if blood loss is severe.   Hospitalization. This may be needed if there is significant continual blood loss.   Dietary changes.  Spleen removal. HOME CARE INSTRUCTIONS Keep all follow-up appointments. It often takes many weeks to correct anemia, and having your health care provider check on your condition and your response to  treatment is very important. SEEK IMMEDIATE MEDICAL CARE IF:   You develop extreme weakness, shortness of breath, or chest pain.   You become dizzy or have trouble concentrating.  You develop heavy vaginal bleeding.   You develop a rash.   You have bloody or black, tarry stools.   You faint.   You vomit up blood.   You vomit repeatedly.   You have abdominal pain.  You have a fever or persistent symptoms for more than 2-3 days.   You have a fever and your symptoms suddenly get worse.   You are dehydrated.  MAKE SURE YOU:  Understand these instructions.  Will watch your condition.  Will get help right away if you are not doing well or get worse.   This information is not intended to replace advice given to you by your health care provider. Make sure you discuss any questions you have with your health care provider.   Document Released: 07/27/2004 Document Revised: 02/19/2013 Document Reviewed: 12/13/2012 Elsevier Interactive Patient Education 2016 [employer] Inc[employer]  

## 2015-04-27 NOTE — Progress Notes (Signed)
Pre visit review using our clinic review tool, if applicable. No additional management support is needed unless otherwise documented below in the visit note. 

## 2015-05-09 ENCOUNTER — Encounter: Payer: Self-pay | Admitting: Family Medicine

## 2015-05-09 DIAGNOSIS — R739 Hyperglycemia, unspecified: Secondary | ICD-10-CM

## 2015-05-09 HISTORY — DX: Hyperglycemia, unspecified: R73.9

## 2015-05-09 NOTE — Assessment & Plan Note (Signed)
Follows with [facility] for care of suprapubic catheter

## 2015-05-09 NOTE — Assessment & Plan Note (Signed)
minimize simple carbs. Increase exercise as tolerated.  

## 2015-05-09 NOTE — Progress Notes (Signed)
Subjective:    Patient ID: Brandon Franklin, male    DOB: [birth year], 81 y.o.   MRN: [id number]  Chief Complaint  Patient presents with  . Follow-up    HPI Patient is in today for follow-up. Is accompanied by his wife and other family is feeling well today. He has dropped methadone further with good results. He's had no flare in his neck pain. He's had no recent injury or fall. They continue to manage his suprapubic catheter with urology. No fevers chills. He reports he is eating well. Denies CP/palp/SOB/HA/congestion/fevers/GI or GU c/o. Taking meds as prescribed  Past Medical History  Diagnosis Date  . Urinary anomaly   . Neck fracture (HCC)   . Depression   . Neuromuscular disorder (HCC)   . Allergy     seasonal  . Arthritis   . CHF (congestive heart failure) (HCC)   . Obesity 04/04/2015  . Low back pain 04/04/2015  . Vitamin B12 deficiency 04/04/2015  . Hyperglycemia 05/09/2015    Past Surgical History  Procedure Laterality Date  . Insertion of suprapubic catheter    . Surgery scrotal / testicular Right     Family History  Problem Relation Age of Onset  . Cancer Sister     Social History   Social History  . Marital Status: Married    Spouse Name: N/A  . Number of Children: N/A  . Years of Education: N/A   Occupational History  . retired    Social History Main Topics  . Smoking status: Never Smoker   . Smokeless tobacco: Not on file  . Alcohol Use: No  . Drug Use: No  . Sexual Activity: No   Other Topics Concern  . Not on file   Social History Narrative    Outpatient Prescriptions Prior to Visit  Medication Sig Dispense Refill  . bisacodyl (DULCOLAX) 5 MG EC tablet Take 5 mg by mouth daily as needed.    . cholecalciferol (VITAMIN D) 1000 UNITS tablet Take 1,000 Units by mouth daily.    . cyanocobalamin (,VITAMIN B-12,) 1000 MCG/ML injection Inject 1 mL (1,000 mcg total) into the muscle every 30 (thirty) days. 10 mL 5  . furosemide (LASIX) 40 MG tablet  Take 40 mg by mouth daily.    Marland Kitchen gabapentin (NEURONTIN) 100 MG capsule Take 300 mg by mouth 3 (three) times daily.     Marland Kitchen levothyroxine (SYNTHROID, LEVOTHROID) 25 MCG tablet Take 1 tablet (25 mcg total) by mouth daily before breakfast. 30 tablet 8  . oxybutynin (DITROPAN) 5 MG tablet Take 10 mg by mouth daily.     Marland Kitchen senna-docusate (SENOKOT-S) 8.6-50 MG per tablet Take 4 tablets by mouth 2 (two) times daily.    . sorbitol 70 % solution Take 15 mLs by mouth daily as needed.    . methadone (DOLOPHINE) 5 MG tablet Take 0.5 tablets (2.5 mg total) by mouth every 8 (eight) hours. 45 tablet 0   No facility-administered medications prior to visit.    Allergies  Allergen Reactions  . Ciprofloxacin     Review of Systems  Constitutional: Positive for malaise/fatigue. Negative for fever.  HENT: Negative for congestion.   Eyes: Negative for discharge.  Respiratory: Negative for shortness of breath.   Cardiovascular: Negative for chest pain, palpitations and leg swelling.  Gastrointestinal: Negative for nausea and abdominal pain.  Genitourinary: Negative for dysuria.  Musculoskeletal: Positive for neck pain. Negative for falls.  Skin: Negative for rash.  Neurological: Negative for loss of consciousness  and headaches.  Endo/Heme/Allergies: Negative for environmental allergies.  Psychiatric/Behavioral: Negative for depression. The patient is not nervous/anxious.        Objective:    Physical Exam  Constitutional: He is oriented to person, place, and time. He appears well-developed and well-nourished. No distress.  HENT:  Head: Normocephalic and atraumatic.  Nose: Nose normal.  Eyes: Right eye exhibits no discharge. Left eye exhibits no discharge.  Neck: Normal range of motion. Neck supple.  Cardiovascular: Normal rate and regular rhythm.   No murmur heard. Pulmonary/Chest: Effort normal and breath sounds normal.  Abdominal: Soft. Bowel sounds are normal. There is no tenderness.    Musculoskeletal: He exhibits no edema.  Neurological: He is alert and oriented to person, place, and time.  Skin: Skin is warm and dry.  Psychiatric: He has a normal mood and affect.  Nursing note and vitals reviewed.   BP 124/92 mmHg  Pulse 63  Temp(Src) 98.2 F (36.8 C) (Oral)  Ht 5\' 8"  (1.727 m)  Wt 248 lb 8 oz (112.719 kg)  BMI 37.79 kg/m2  SpO2 100% Wt Readings from Last 3 Encounters:  04/27/15 248 lb 8 oz (112.719 kg)  03/26/15 248 lb (112.492 kg)  02/16/15 248 lb (112.492 kg)     Lab Results  Component Value Date   WBC 4.4 03/26/2015   HGB 12.9* 03/26/2015   HCT 39.1 03/26/2015   PLT 164.0 03/26/2015   GLUCOSE 106* 03/26/2015   ALT 4 03/26/2015   AST 8 03/26/2015   NA 138 03/26/2015   K 3.8 03/26/2015   CL 103 03/26/2015   CREATININE 0.79 03/26/2015   BUN 13 03/26/2015   CO2 30 03/26/2015   TSH 4.47 03/26/2015   INR 1.1 05/17/2008    Lab Results  Component Value Date   TSH 4.47 03/26/2015   Lab Results  Component Value Date   WBC 4.4 03/26/2015   HGB 12.9* 03/26/2015   HCT 39.1 03/26/2015   MCV 89.6 03/26/2015   PLT 164.0 03/26/2015   Lab Results  Component Value Date   NA 138 03/26/2015   K 3.8 03/26/2015   CO2 30 03/26/2015   GLUCOSE 106* 03/26/2015   BUN 13 03/26/2015   CREATININE 0.79 03/26/2015   BILITOT 0.5 03/26/2015   ALKPHOS 52 03/26/2015   AST 8 03/26/2015   ALT 4 03/26/2015   PROT 6.8 03/26/2015   ALBUMIN 3.4* 03/26/2015   CALCIUM 8.7 03/26/2015   GFR 98.48 03/26/2015   No results found for: CHOL No results found for: HDL No results found for: LDLCALC No results found for: TRIG No results found for: CHOLHDL No results found for: [id number]     Assessment & Plan:   Problem List Items Addressed This Visit    Vitamin B12 deficiency    Continue supplements, given shot in office today      Urinary anomaly    Follows with [facility] for care of suprapubic catheter      Obesity    Encouraged DASH diet, decrease  po intake and increase exercise as tolerated. Needs 7-8 hours of sleep nightly. Avoid trans fats, eat small, frequent meals every 4-5 hours with lean proteins, complex carbs and healthy fats. Minimize simple carbs, GMO foods.      Neck fracture (HCC)    Greatly improved and has dropped methadone even further. May continue prn use, recommended as little use as possible.      Hyperglycemia    minimize simple carbs. Increase exercise as tolerated.  CHF (congestive heart failure) (HCC)    No recent exacerbation, doing well, minimize sodium, elevate feet above heart 15 minutes a couple times a day       Other Visit Diagnoses    Vitamin B 12 deficiency    -  Primary    Relevant Medications    methadone (DOLOPHINE) 5 MG tablet    cyanocobalamin ((VITAMIN B-12)) injection 1,000 mcg (Completed)    Other Relevant Orders    CBC    Vitamin B12       I am having Mr. Parkison maintain his gabapentin, sorbitol, bisacodyl, cholecalciferol, oxybutynin, furosemide, senna-docusate, cyanocobalamin, levothyroxine, and methadone. We administered cyanocobalamin.  Meds ordered this encounter  Medications  . methadone (DOLOPHINE) 5 MG tablet    Sig: Take 0.5 tablets (2.5 mg total) by mouth every 8 (eight) hours.    Dispense:  45 tablet    Refill:  0  . cyanocobalamin ((VITAMIN B-12)) injection 1,000 mcg    Sig:      Danise Edge, MD

## 2015-05-09 NOTE — Assessment & Plan Note (Signed)
Greatly improved and has dropped methadone even further. May continue prn use, recommended as little use as possible.

## 2015-05-09 NOTE — Assessment & Plan Note (Addendum)
Continue supplements, given shot in office today

## 2015-05-09 NOTE — Assessment & Plan Note (Signed)
Encouraged DASH diet, decrease po intake and increase exercise as tolerated. Needs 7-8 hours of sleep nightly. Avoid trans fats, eat small, frequent meals every 4-5 hours with lean proteins, complex carbs and healthy fats. Minimize simple carbs, GMO foods. 

## 2015-05-09 NOTE — Assessment & Plan Note (Signed)
No recent exacerbation, doing well, minimize sodium, elevate feet above heart 15 minutes a couple times a day

## 2015-05-13 DIAGNOSIS — N319 Neuromuscular dysfunction of bladder, unspecified: Secondary | ICD-10-CM | POA: Diagnosis not present

## 2015-05-17 ENCOUNTER — Other Ambulatory Visit: Payer: Self-pay | Admitting: *Deleted

## 2015-05-17 ENCOUNTER — Ambulatory Visit: Payer: Self-pay | Admitting: *Deleted

## 2015-05-17 ENCOUNTER — Encounter: Payer: Self-pay | Admitting: *Deleted

## 2015-05-17 NOTE — Patient Outreach (Signed)
[facility]) Care Management   05/17/2015  Brandon Franklin 11-Jul-1925 [id number]  Brandon Franklin is an 81 y.o. male  Subjective:   Objective:   ROS  Physical Exam  Current Medications:   Current Outpatient Prescriptions  Medication Sig Dispense Refill  . bisacodyl (DULCOLAX) 5 MG EC tablet Take 5 mg by mouth daily as needed.    . cholecalciferol (VITAMIN D) 1000 UNITS tablet Take 1,000 Units by mouth daily.    . cyanocobalamin (,VITAMIN B-12,) 1000 MCG/ML injection Inject 1 mL (1,000 mcg total) into the muscle every 30 (thirty) days. 10 mL 5  . furosemide (LASIX) 40 MG tablet Take 40 mg by mouth daily.    Marland Kitchen. gabapentin (NEURONTIN) 100 MG capsule Take 300 mg by mouth 3 (three) times daily.     Marland Kitchen. levothyroxine (SYNTHROID, LEVOTHROID) 25 MCG tablet Take 1 tablet (25 mcg total) by mouth daily before breakfast. 30 tablet 8  . methadone (DOLOPHINE) 5 MG tablet Take 0.5 tablets (2.5 mg total) by mouth every 8 (eight) hours. 45 tablet 0  . oxybutynin (DITROPAN) 5 MG tablet Take 10 mg by mouth daily.     Marland Kitchen. senna-docusate (SENOKOT-S) 8.6-50 MG per tablet Take 4 tablets by mouth 2 (two) times daily.     No current facility-administered medications for this visit.    Functional Status:   In your present state of health, do you have any difficulty performing the following activities: 05/17/2015 02/16/2015  Hearing? N N  Vision? N N  Difficulty concentrating or making decisions? N N  Walking or climbing stairs? Y Y  Dressing or bathing? Y Y  Doing errands, shopping? Malvin JohnsY Y  Preparing Food and eating ? Y Y  Using the Toilet? Y Y  In the past six months, have you accidently leaked urine? Y Y  Do you have problems with loss of bowel control? Y Y  Managing your Medications? Y Y  Managing your Finances? Malvin JohnsY Y  Housekeeping or managing your Housekeeping? Malvin JohnsY Y    Fall/Depression Screening:    PHQ 2/9 Scores 05/17/2015 02/16/2015 11/06/2014  PHQ - 2 Score 1 0 2    Assessment:     Plan:

## 2015-05-20 NOTE — Patient Outreach (Signed)
[facility]) Care Management   05/20/2015  Pike Scantlebury 12/19/1925 [id number]  Nester Bachus is an 81 y.o. male  Subjective:  Quarterly home visit. Pt has made a good transition to a new primary care provider. He and his wife are quite pleased with Dr. Randel Pigg. She will be able to prescribe his chronic pain medication. He has no new problems today.  Objective:   Review of Systems  HENT: Negative.   Eyes: Positive for discharge.       Left eye with mucous discharge - chronic.  Respiratory: Negative.   Cardiovascular: Negative.   Gastrointestinal: Negative.   Genitourinary:       Suprapubic cath in place, clear yellow urine in bag.  Musculoskeletal: Negative.   Skin: Negative.   Neurological: Positive for weakness.  Endo/Heme/Allergies: Negative.   Psychiatric/Behavioral: Negative.    BP 116/78 mmHg  Pulse 72  Resp 16  Wt 245 lb (111.131 kg)  SpO2 96%   Physical Exam  Constitutional: He is oriented to person, place, and time. He appears well-developed and well-nourished.  HENT:  Head: Normocephalic and atraumatic.  Neck:  Limited ROM.  Cardiovascular: Normal rate and regular rhythm.   Murmur heard. Respiratory: Effort normal and breath sounds normal.  GI: Soft. Bowel sounds are normal.  Musculoskeletal:  Limited ROM.  Neurological: He is alert and oriented to person, place, and time.  Skin: Skin is warm and dry.  Psychiatric: He has a normal mood and affect.    Current Medications:   Current Outpatient Prescriptions  Medication Sig Dispense Refill  . bisacodyl (DULCOLAX) 5 MG EC tablet Take 5 mg by mouth daily as needed.    . cholecalciferol (VITAMIN D) 1000 UNITS tablet Take 1,000 Units by mouth daily.    . cyanocobalamin (,VITAMIN B-12,) 1000 MCG/ML injection Inject 1 mL (1,000 mcg total) into the muscle every 30 (thirty) days. 10 mL 5  . furosemide (LASIX) 40 MG tablet Take 40 mg by mouth daily.    Marland Kitchen gabapentin (NEURONTIN) 100 MG capsule Take  300 mg by mouth 3 (three) times daily.     Marland Kitchen levothyroxine (SYNTHROID, LEVOTHROID) 25 MCG tablet Take 1 tablet (25 mcg total) by mouth daily before breakfast. 30 tablet 8  . methadone (DOLOPHINE) 5 MG tablet Take 0.5 tablets (2.5 mg total) by mouth every 8 (eight) hours. 45 tablet 0  . oxybutynin (DITROPAN) 5 MG tablet Take 10 mg by mouth daily.     Marland Kitchen senna-docusate (SENOKOT-S) 8.6-50 MG per tablet Take 4 tablets by mouth 2 (two) times daily.     No current facility-administered medications for this visit.    Functional Status:   In your present state of health, do you have any difficulty performing the following activities: 05/17/2015 02/16/2015  Hearing? N N  Vision? N N  Difficulty concentrating or making decisions? N N  Walking or climbing stairs? Y Y  Dressing or bathing? Y Y  Doing errands, shopping? Tempie Donning  Preparing Food and eating ? Y Y  Using the Toilet? Y Y  In the past six months, have you accidently leaked urine? Y Y  Do you have problems with loss of bowel control? Y Y  Managing your Medications? Y Y  Managing your Finances? Tempie Donning  Housekeeping or managing your Housekeeping? Tempie Donning    Fall/Depression Screening:    PHQ 2/9 Scores 05/17/2015 02/16/2015 11/06/2014  PHQ - 2 Score 1 0 2    Assessment:  Stable  Deconditioned  Plan: I will see him again in 3 months. At that time I may close his if he continues to be stable.  [facility] CM Care Plan Problem One        Most Recent Value   Care Plan Problem One  Pt needs a provider that will prescribe his Methadone.   Role Documenting the Problem One  Care Management Coordinator   Care Plan for Problem One  Active   [facility] Long Term Goal (31-90 days)  Pt/wife to call new provider suggested to establish care sometime in the next 3 montns.   [facility] Long Term Goal Start Date  02/16/15   [facility] Long Term Goal Met Date  05/20/15   Interventions for Problem One Long Term Goal  Completed new pt visit and is established with new  provicer.    [facility] CM Care Plan Problem Two        Most Recent Value   Care Plan Problem Two  High risk for falls and injury.   Role Documenting the Problem Two  Care Management Coordinator   Care Plan for Problem Two  Active   Interventions for Problem Two Long Term Goal   Reviewed safety precautions.   [facility] Long Term Goal (31-90) days  Pt will not fall in the next 90 days.   [facility] Long Term Goal Start Date  05/17/15   [facility] CM Short Term Goal #1 (0-30 days)  Pt will not fall in the next 30 days.   [facility] CM Short Term Goal #1 Start Date  02/16/15   [facility] CM Short Term Goal #1 Met Date   05/20/15   Interventions for Short Term Goal #2   Reviewed safety precautions.     Deloria Lair [facility] [phone]

## 2015-06-10 DIAGNOSIS — N319 Neuromuscular dysfunction of bladder, unspecified: Secondary | ICD-10-CM | POA: Diagnosis not present

## 2015-06-24 DIAGNOSIS — A419 Sepsis, unspecified organism: Secondary | ICD-10-CM | POA: Diagnosis not present

## 2015-06-24 DIAGNOSIS — T83518A Infection and inflammatory reaction due to other urinary catheter, initial encounter: Secondary | ICD-10-CM | POA: Diagnosis present

## 2015-06-24 DIAGNOSIS — R402411 Glasgow coma scale score 13-15, in the field [EMT or ambulance]: Secondary | ICD-10-CM | POA: Diagnosis not present

## 2015-06-24 DIAGNOSIS — B964 Proteus (mirabilis) (morganii) as the cause of diseases classified elsewhere: Secondary | ICD-10-CM | POA: Diagnosis present

## 2015-06-24 DIAGNOSIS — E039 Hypothyroidism, unspecified: Secondary | ICD-10-CM | POA: Diagnosis present

## 2015-06-24 DIAGNOSIS — J189 Pneumonia, unspecified organism: Secondary | ICD-10-CM | POA: Diagnosis not present

## 2015-06-24 DIAGNOSIS — Z79899 Other long term (current) drug therapy: Secondary | ICD-10-CM | POA: Diagnosis not present

## 2015-06-24 DIAGNOSIS — N390 Urinary tract infection, site not specified: Secondary | ICD-10-CM | POA: Diagnosis not present

## 2015-06-24 DIAGNOSIS — N3942 Incontinence without sensory awareness: Secondary | ICD-10-CM | POA: Diagnosis not present

## 2015-06-24 DIAGNOSIS — E079 Disorder of thyroid, unspecified: Secondary | ICD-10-CM | POA: Diagnosis not present

## 2015-06-24 DIAGNOSIS — R509 Fever, unspecified: Secondary | ICD-10-CM | POA: Diagnosis not present

## 2015-06-24 DIAGNOSIS — Z66 Do not resuscitate: Secondary | ICD-10-CM | POA: Diagnosis present

## 2015-06-24 DIAGNOSIS — N319 Neuromuscular dysfunction of bladder, unspecified: Secondary | ICD-10-CM | POA: Diagnosis not present

## 2015-06-24 DIAGNOSIS — R7881 Bacteremia: Secondary | ICD-10-CM | POA: Diagnosis not present

## 2015-06-24 DIAGNOSIS — R300 Dysuria: Secondary | ICD-10-CM | POA: Diagnosis not present

## 2015-06-24 DIAGNOSIS — N3000 Acute cystitis without hematuria: Secondary | ICD-10-CM | POA: Diagnosis not present

## 2015-06-24 DIAGNOSIS — Z881 Allergy status to other antibiotic agents status: Secondary | ICD-10-CM | POA: Diagnosis not present

## 2015-07-03 DIAGNOSIS — J189 Pneumonia, unspecified organism: Secondary | ICD-10-CM | POA: Diagnosis not present

## 2015-07-03 DIAGNOSIS — N390 Urinary tract infection, site not specified: Secondary | ICD-10-CM | POA: Diagnosis not present

## 2015-07-03 DIAGNOSIS — R32 Unspecified urinary incontinence: Secondary | ICD-10-CM | POA: Diagnosis not present

## 2015-07-03 DIAGNOSIS — N319 Neuromuscular dysfunction of bladder, unspecified: Secondary | ICD-10-CM | POA: Diagnosis not present

## 2015-07-03 DIAGNOSIS — Z9359 Other cystostomy status: Secondary | ICD-10-CM | POA: Diagnosis not present

## 2015-07-05 DIAGNOSIS — R32 Unspecified urinary incontinence: Secondary | ICD-10-CM | POA: Diagnosis not present

## 2015-07-05 DIAGNOSIS — N390 Urinary tract infection, site not specified: Secondary | ICD-10-CM | POA: Diagnosis not present

## 2015-07-05 DIAGNOSIS — Z9359 Other cystostomy status: Secondary | ICD-10-CM | POA: Diagnosis not present

## 2015-07-05 DIAGNOSIS — J189 Pneumonia, unspecified organism: Secondary | ICD-10-CM | POA: Diagnosis not present

## 2015-07-05 DIAGNOSIS — N319 Neuromuscular dysfunction of bladder, unspecified: Secondary | ICD-10-CM | POA: Diagnosis not present

## 2015-07-06 DIAGNOSIS — N390 Urinary tract infection, site not specified: Secondary | ICD-10-CM | POA: Diagnosis not present

## 2015-07-06 DIAGNOSIS — N319 Neuromuscular dysfunction of bladder, unspecified: Secondary | ICD-10-CM | POA: Diagnosis not present

## 2015-07-06 DIAGNOSIS — J189 Pneumonia, unspecified organism: Secondary | ICD-10-CM | POA: Diagnosis not present

## 2015-07-06 DIAGNOSIS — Z9359 Other cystostomy status: Secondary | ICD-10-CM | POA: Diagnosis not present

## 2015-07-06 DIAGNOSIS — R32 Unspecified urinary incontinence: Secondary | ICD-10-CM | POA: Diagnosis not present

## 2015-07-09 DIAGNOSIS — N319 Neuromuscular dysfunction of bladder, unspecified: Secondary | ICD-10-CM | POA: Diagnosis not present

## 2015-07-09 DIAGNOSIS — N390 Urinary tract infection, site not specified: Secondary | ICD-10-CM | POA: Diagnosis not present

## 2015-07-09 DIAGNOSIS — J189 Pneumonia, unspecified organism: Secondary | ICD-10-CM | POA: Diagnosis not present

## 2015-07-09 DIAGNOSIS — R32 Unspecified urinary incontinence: Secondary | ICD-10-CM | POA: Diagnosis not present

## 2015-07-09 DIAGNOSIS — Z9359 Other cystostomy status: Secondary | ICD-10-CM | POA: Diagnosis not present

## 2015-07-13 ENCOUNTER — Other Ambulatory Visit: Payer: Self-pay | Admitting: Family Medicine

## 2015-07-13 MED ORDER — "SYRINGE 25G X 1"" 3 ML MISC": Status: AC

## 2015-07-13 MED ORDER — CYANOCOBALAMIN 1000 MCG/ML IJ SOLN: 1000.0000 ug | INTRAMUSCULAR | Status: DC

## 2015-07-14 DIAGNOSIS — R32 Unspecified urinary incontinence: Secondary | ICD-10-CM | POA: Diagnosis not present

## 2015-07-14 DIAGNOSIS — Z9359 Other cystostomy status: Secondary | ICD-10-CM | POA: Diagnosis not present

## 2015-07-14 DIAGNOSIS — N390 Urinary tract infection, site not specified: Secondary | ICD-10-CM | POA: Diagnosis not present

## 2015-07-14 DIAGNOSIS — J189 Pneumonia, unspecified organism: Secondary | ICD-10-CM | POA: Diagnosis not present

## 2015-07-14 DIAGNOSIS — N319 Neuromuscular dysfunction of bladder, unspecified: Secondary | ICD-10-CM | POA: Diagnosis not present

## 2015-07-15 DIAGNOSIS — N390 Urinary tract infection, site not specified: Secondary | ICD-10-CM | POA: Diagnosis not present

## 2015-07-15 DIAGNOSIS — R32 Unspecified urinary incontinence: Secondary | ICD-10-CM | POA: Diagnosis not present

## 2015-07-15 DIAGNOSIS — N319 Neuromuscular dysfunction of bladder, unspecified: Secondary | ICD-10-CM | POA: Diagnosis not present

## 2015-07-15 DIAGNOSIS — Z9359 Other cystostomy status: Secondary | ICD-10-CM | POA: Diagnosis not present

## 2015-07-15 DIAGNOSIS — J189 Pneumonia, unspecified organism: Secondary | ICD-10-CM | POA: Diagnosis not present

## 2015-07-16 ENCOUNTER — Telehealth: Payer: Self-pay | Admitting: *Deleted

## 2015-07-16 NOTE — Telephone Encounter (Signed)
[facility] from [facility]; forwarded to provider/SLS 01/13

## 2015-07-19 NOTE — Telephone Encounter (Signed)
Signed forms faxed to [facility] successfully, sent for scanning. JG//CMA  

## 2015-07-23 DIAGNOSIS — R32 Unspecified urinary incontinence: Secondary | ICD-10-CM | POA: Diagnosis not present

## 2015-07-23 DIAGNOSIS — Z9359 Other cystostomy status: Secondary | ICD-10-CM | POA: Diagnosis not present

## 2015-07-23 DIAGNOSIS — J189 Pneumonia, unspecified organism: Secondary | ICD-10-CM | POA: Diagnosis not present

## 2015-07-23 DIAGNOSIS — N390 Urinary tract infection, site not specified: Secondary | ICD-10-CM | POA: Diagnosis not present

## 2015-07-23 DIAGNOSIS — N319 Neuromuscular dysfunction of bladder, unspecified: Secondary | ICD-10-CM | POA: Diagnosis not present

## 2015-08-03 DIAGNOSIS — N390 Urinary tract infection, site not specified: Secondary | ICD-10-CM | POA: Diagnosis not present

## 2015-08-03 DIAGNOSIS — N319 Neuromuscular dysfunction of bladder, unspecified: Secondary | ICD-10-CM | POA: Diagnosis not present

## 2015-08-03 DIAGNOSIS — Z9359 Other cystostomy status: Secondary | ICD-10-CM | POA: Diagnosis not present

## 2015-08-03 DIAGNOSIS — R32 Unspecified urinary incontinence: Secondary | ICD-10-CM | POA: Diagnosis not present

## 2015-08-03 DIAGNOSIS — J189 Pneumonia, unspecified organism: Secondary | ICD-10-CM | POA: Diagnosis not present

## 2015-08-11 DIAGNOSIS — N319 Neuromuscular dysfunction of bladder, unspecified: Secondary | ICD-10-CM | POA: Diagnosis not present

## 2015-08-11 DIAGNOSIS — N390 Urinary tract infection, site not specified: Secondary | ICD-10-CM | POA: Diagnosis not present

## 2015-08-17 ENCOUNTER — Ambulatory Visit: Payer: Self-pay | Admitting: *Deleted

## 2015-08-17 ENCOUNTER — Other Ambulatory Visit: Payer: Self-pay | Admitting: *Deleted

## 2015-08-17 ENCOUNTER — Encounter: Payer: Self-pay | Admitting: *Deleted

## 2015-08-17 NOTE — Patient Outreach (Signed)
S: Pt  Had a hospitalization Dec 22 - 27 at [facility]. Mrs. Lohn reports pt suddenly became confused and she called 911. The Emergency Squad evaluated pt and said they thought he was stable and left but as the day went on the pt developed a temperature and Mrs. Diantonio called 911 again. Pt's temp reached 102 and he was transported to the hospital. Pt was diagnosed with pneumonia and UTI.   He did not have a follow up appt with his primary as he had an appt with his urologist, so he went there instead to have his monthly foley catheter change. While there he was given an additional antibiotic, Bactrim DS, 6 doses.   Pt has had a reaction to this. He developed blisters on his lower inner lip.   He has been [facility]. Last visit will be tomorrow.  O:  BP 100/60 mmHg  Pulse 62  Resp 18  Wt 242 lb (109.77 kg)  SpO2 98%       Lower lip with flattened blisters on mucous membrane.       RRR       Lungs clear       Suprapubic catheter in place - clear yellow urine       No edema  A:  Resolved pneumonia and UTI       Cervical spondylosis       Allergic reaction to Bactrim DS - oral blisters  P:   Reinforced to pt and wife to call me at first sign of any health issue. If I am not available call our 24 nurse line.       I will see pt again in 3 months.

## 2015-08-18 DIAGNOSIS — J189 Pneumonia, unspecified organism: Secondary | ICD-10-CM | POA: Diagnosis not present

## 2015-08-18 DIAGNOSIS — N319 Neuromuscular dysfunction of bladder, unspecified: Secondary | ICD-10-CM | POA: Diagnosis not present

## 2015-08-18 DIAGNOSIS — R32 Unspecified urinary incontinence: Secondary | ICD-10-CM | POA: Diagnosis not present

## 2015-08-18 DIAGNOSIS — Z9359 Other cystostomy status: Secondary | ICD-10-CM | POA: Diagnosis not present

## 2015-08-18 DIAGNOSIS — N390 Urinary tract infection, site not specified: Secondary | ICD-10-CM | POA: Diagnosis not present

## 2015-08-30 ENCOUNTER — Ambulatory Visit: Payer: Medicare Other | Admitting: Family Medicine

## 2015-08-30 DIAGNOSIS — R32 Unspecified urinary incontinence: Secondary | ICD-10-CM | POA: Diagnosis not present

## 2015-08-30 DIAGNOSIS — N390 Urinary tract infection, site not specified: Secondary | ICD-10-CM | POA: Diagnosis not present

## 2015-08-30 DIAGNOSIS — Z9359 Other cystostomy status: Secondary | ICD-10-CM | POA: Diagnosis not present

## 2015-08-30 DIAGNOSIS — N319 Neuromuscular dysfunction of bladder, unspecified: Secondary | ICD-10-CM | POA: Diagnosis not present

## 2015-08-30 DIAGNOSIS — J189 Pneumonia, unspecified organism: Secondary | ICD-10-CM | POA: Diagnosis not present

## 2015-09-01 ENCOUNTER — Telehealth: Payer: Self-pay | Admitting: Family Medicine

## 2015-09-01 DIAGNOSIS — E538 Deficiency of other specified B group vitamins: Secondary | ICD-10-CM

## 2015-09-01 NOTE — Telephone Encounter (Signed)
Last filled: 04/27/15 Amt: 45, 0 Last OV:  04/27/15 Next appt scheduled: 09/13/15 [facility] contract on file.   NO UDS   Please advise.

## 2015-09-01 NOTE — Telephone Encounter (Signed)
OK to refill Metadone

## 2015-09-01 NOTE — Telephone Encounter (Signed)
Caller name: Lucendia Herrlich   Relationship to patient: Spouse   Can be reached: [phone]  Pharmacy: [id number] - [place], [place] - [address]  Reason for call: Pt need a refill on his methadone  Rx.

## 2015-09-02 MED ORDER — METHADONE HCL 5 MG PO TABS: 2.5000 mg | ORAL_TABLET | Freq: Three times a day (TID) | ORAL | Status: DC

## 2015-09-02 NOTE — Telephone Encounter (Signed)
Hardcopy printed and on counter for signature.. Patients wife informed to pickup hardcopy at the front desk.

## 2015-09-07 DIAGNOSIS — H6001 Abscess of right external ear: Secondary | ICD-10-CM | POA: Diagnosis not present

## 2015-09-10 DIAGNOSIS — N390 Urinary tract infection, site not specified: Secondary | ICD-10-CM | POA: Diagnosis not present

## 2015-09-13 ENCOUNTER — Ambulatory Visit (INDEPENDENT_AMBULATORY_CARE_PROVIDER_SITE_OTHER): Payer: Medicare Other | Admitting: Family Medicine

## 2015-09-13 ENCOUNTER — Encounter: Payer: Self-pay | Admitting: Family Medicine

## 2015-09-13 VITALS — HR 60 | Temp 97.6°F | Wt 240.0 lb

## 2015-09-13 DIAGNOSIS — S12100D Unspecified displaced fracture of second cervical vertebra, subsequent encounter for fracture with routine healing: Secondary | ICD-10-CM

## 2015-09-13 DIAGNOSIS — R739 Hyperglycemia, unspecified: Secondary | ICD-10-CM

## 2015-09-13 DIAGNOSIS — S12100A Unspecified displaced fracture of second cervical vertebra, initial encounter for closed fracture: Secondary | ICD-10-CM

## 2015-09-13 DIAGNOSIS — I509 Heart failure, unspecified: Secondary | ICD-10-CM | POA: Diagnosis not present

## 2015-09-13 DIAGNOSIS — Z23 Encounter for immunization: Secondary | ICD-10-CM

## 2015-09-13 DIAGNOSIS — Q649 Congenital malformation of urinary system, unspecified: Secondary | ICD-10-CM | POA: Diagnosis not present

## 2015-09-13 NOTE — Assessment & Plan Note (Signed)
Doing well, no recent flare

## 2015-09-13 NOTE — Assessment & Plan Note (Signed)
minimize simple carbs. Increase exercise as tolerated. Given prevnar today

## 2015-09-13 NOTE — Progress Notes (Signed)
Pre visit review using our clinic review tool, if applicable. No additional management support is needed unless otherwise documented below in the visit note. 

## 2015-09-13 NOTE — Assessment & Plan Note (Signed)
Continues to follow with urology and is struggling with recurrent infections. Encouraged adequate hydration and probiotics.

## 2015-09-13 NOTE — Progress Notes (Signed)
Subjective:    Patient ID: Brandon Franklin, male    DOB: March 11, 1926, 82 y.o.   MRN: [id number]  Chief Complaint  Patient presents with  . Follow-up    HPI Patient is in today for follow up. He is accompanied by his wife, he has been hospitalized at [facility] with UTI and sepsis but is feeling well today. Has done well at home. Is eating well. Notes some increase in constipation, having to strain to move small movements. Continues to have indwelling cather. Denies CP/palp/SOB/HA/congestion/fevers/GI or GU c/o. Taking meds as prescribed  Past Medical History  Diagnosis Date  . Urinary anomaly   . Neck fracture (HCC)   . Depression   . Neuromuscular disorder (HCC)   . Allergy     seasonal  . Arthritis   . CHF (congestive heart failure) (HCC)   . Obesity 04/04/2015  . Low back pain 04/04/2015  . Vitamin B12 deficiency 04/04/2015  . Hyperglycemia 05/09/2015    Past Surgical History  Procedure Laterality Date  . Insertion of suprapubic catheter    . Surgery scrotal / testicular Right     Family History  Problem Relation Age of Onset  . Cancer Sister     Social History   Social History  . Marital Status: Married    Spouse Name: N/A  . Number of Children: N/A  . Years of Education: N/A   Occupational History  . retired    Social History Main Topics  . Smoking status: Never Smoker   . Smokeless tobacco: Not on file  . Alcohol Use: No  . Drug Use: No  . Sexual Activity: No   Other Topics Concern  . Not on file   Social History Narrative    Outpatient Prescriptions Prior to Visit  Medication Sig Dispense Refill  . bisacodyl (DULCOLAX) 5 MG EC tablet Take 5 mg by mouth daily as needed.    . cholecalciferol (VITAMIN D) 1000 UNITS tablet Take 1,000 Units by mouth daily.    . cyanocobalamin (,VITAMIN B-12,) 1000 MCG/ML injection Inject 1 mL (1,000 mcg total) into the muscle every 30 (thirty) days. 10 mL 1  . furosemide (LASIX) 40 MG tablet Take 40 mg by mouth  daily.    Marland Kitchen gabapentin (NEURONTIN) 100 MG capsule Take 300 mg by mouth 3 (three) times daily.     Marland Kitchen levothyroxine (SYNTHROID, LEVOTHROID) 25 MCG tablet Take 1 tablet (25 mcg total) by mouth daily before breakfast. 30 tablet 8  . methadone (DOLOPHINE) 5 MG tablet Take 0.5 tablets (2.5 mg total) by mouth every 8 (eight) hours. 45 tablet 0  . oxybutynin (DITROPAN) 5 MG tablet Take 10 mg by mouth daily.     Marland Kitchen senna-docusate (SENOKOT-S) 8.6-50 MG per tablet Take 4 tablets by mouth 2 (two) times daily.    . Syringe/Needle, Disp, (SYRINGE 3CC/25GX1") 25G X 1" 3 ML MISC To use with vitamin B12 50 each 1   No facility-administered medications prior to visit.    Allergies  Allergen Reactions  . Ciprofloxacin   . Levaquin [Levofloxacin In D5w] Other (See Comments)    Review of Systems  Constitutional: Positive for malaise/fatigue. Negative for fever and chills.  HENT: Negative for congestion and hearing loss.   Eyes: Negative for discharge.  Respiratory: Negative for cough, sputum production and shortness of breath.   Cardiovascular: Negative for chest pain, palpitations and leg swelling.  Gastrointestinal: Negative for heartburn, nausea, vomiting, abdominal pain, diarrhea, constipation and blood in stool.  Genitourinary: Negative for dysuria, urgency, frequency and hematuria.  Musculoskeletal: Positive for back pain and neck pain. Negative for myalgias and falls.  Skin: Negative for rash.  Neurological: Negative for dizziness, sensory change, loss of consciousness, weakness and headaches.  Endo/Heme/Allergies: Negative for environmental allergies. Does not bruise/bleed easily.  Psychiatric/Behavioral: Negative for depression and suicidal ideas. The patient is not nervous/anxious and does not have insomnia.        Objective:    Physical Exam  Constitutional: He is oriented to person, place, and time. He appears well-developed and well-nourished. No distress.  HENT:  Head: Normocephalic and  atraumatic.  Nose: Nose normal.  Eyes: Right eye exhibits no discharge. Left eye exhibits no discharge.  Neck: Normal range of motion. Neck supple.  Cardiovascular: Normal rate and regular rhythm.   No murmur heard. Pulmonary/Chest: Effort normal and breath sounds normal.  Abdominal: Soft. Bowel sounds are normal. There is no tenderness.  Musculoskeletal: He exhibits no edema.  Neurological: He is alert and oriented to person, place, and time.  Skin: Skin is warm and dry.  Psychiatric: He has a normal mood and affect.  Nursing note and vitals reviewed.   Pulse 60  Temp(Src) 97.6 F (36.4 C) (Oral)  Wt 240 lb (108.863 kg)  SpO2 98% Wt Readings from Last 3 Encounters:  09/13/15 240 lb (108.863 kg)  08/17/15 242 lb (109.77 kg)  05/17/15 245 lb (111.131 kg)     Lab Results  Component Value Date   WBC 4.4 03/26/2015   HGB 12.9* 03/26/2015   HCT 39.1 03/26/2015   PLT 164.0 03/26/2015   GLUCOSE 106* 03/26/2015   ALT 4 03/26/2015   AST 8 03/26/2015   NA 138 03/26/2015   K 3.8 03/26/2015   CL 103 03/26/2015   CREATININE 0.79 03/26/2015   BUN 13 03/26/2015   CO2 30 03/26/2015   TSH 4.47 03/26/2015   INR 1.1 05/17/2008    Lab Results  Component Value Date   TSH 4.47 03/26/2015   Lab Results  Component Value Date   WBC 4.4 03/26/2015   HGB 12.9* 03/26/2015   HCT 39.1 03/26/2015   MCV 89.6 03/26/2015   PLT 164.0 03/26/2015   Lab Results  Component Value Date   NA 138 03/26/2015   K 3.8 03/26/2015   CO2 30 03/26/2015   GLUCOSE 106* 03/26/2015   BUN 13 03/26/2015   CREATININE 0.79 03/26/2015   BILITOT 0.5 03/26/2015   ALKPHOS 52 03/26/2015   AST 8 03/26/2015   ALT 4 03/26/2015   PROT 6.8 03/26/2015   ALBUMIN 3.4* 03/26/2015   CALCIUM 8.7 03/26/2015   GFR 98.48 03/26/2015   No results found for: CHOL No results found for: HDL No results found for: LDLCALC No results found for: TRIG No results found for: CHOLHDL No results found for: ZOXW9U       Assessment & Plan:   Problem List Items Addressed This Visit    CHF (congestive heart failure) (HCC)    Doing well, no recent flare      Hyperglycemia    minimize simple carbs. Increase exercise as tolerated. Given prevnar today      Neck fracture (HCC)    Doing better on minimal pain meds, will continue to monitor      Urinary anomaly    Continues to follow with urology and is struggling with recurrent infections. Encouraged adequate hydration and probiotics.        Other Visit Diagnoses    Need for  vaccination with 13-polyvalent pneumococcal conjugate vaccine    -  Primary    Relevant Orders    Pneumococcal conjugate vaccine 13-valent (Completed)       I am having Mr. Elyn PeersYork maintain his gabapentin, bisacodyl, cholecalciferol, oxybutynin, furosemide, senna-docusate, levothyroxine, cyanocobalamin, SYRINGE 3CC/25GX1", and methadone.  No orders of the defined types were placed in this encounter.     Danise EdgeBLYTH, STACEY, MD

## 2015-09-13 NOTE — Assessment & Plan Note (Signed)
Doing better on minimal pain meds, will continue to monitor

## 2015-09-13 NOTE — Patient Instructions (Signed)
Encouraged increased hydration and fiber in diet. Daily probiotics. If bowels not moving can use MOM 2 tbls po in 4 oz of warm prune juice by mouth every 2-3 days. If no results then repeat in 4 hours with  Dulcolax suppository pr, may repeat again in 4 more hours as needed. Seek care if symptoms worsen. Consider daily Miralax and/or Dulcolax if symptoms persist.    NOW probiotic 1 cap daily especially when taking antibiotics Constipation, Adult Constipation is when a person has fewer than three bowel movements a week, has difficulty having a bowel movement, or has stools that are dry, hard, or larger than normal. As people grow older, constipation is more common. A low-fiber diet, not taking in enough fluids, and taking certain medicines may make constipation worse.  CAUSES   Certain medicines, such as antidepressants, pain medicine, iron supplements, antacids, and water pills.   Certain diseases, such as diabetes, irritable bowel syndrome (IBS), thyroid disease, or depression.   Not drinking enough water.   Not eating enough fiber-rich foods.   Stress or travel.   Lack of physical activity or exercise.   Ignoring the urge to have a bowel movement.   Using laxatives too much.  SIGNS AND SYMPTOMS   Having fewer than three bowel movements a week.   Straining to have a bowel movement.   Having stools that are hard, dry, or larger than normal.   Feeling full or bloated.   Pain in the lower abdomen.   Not feeling relief after having a bowel movement.  DIAGNOSIS  Your health care provider will take a medical history and perform a physical exam. Further testing may be done for severe constipation. Some tests may include:  A barium enema X-ray to examine your rectum, colon, and, sometimes, your small intestine.   A sigmoidoscopy to examine your lower colon.   A colonoscopy to examine your entire colon. TREATMENT  Treatment will depend on the severity of your  constipation and what is causing it. Some dietary treatments include drinking more fluids and eating more fiber-rich foods. Lifestyle treatments may include regular exercise. If these diet and lifestyle recommendations do not help, your health care provider may recommend taking over-the-counter laxative medicines to help you have bowel movements. Prescription medicines may be prescribed if over-the-counter medicines do not work.  HOME CARE INSTRUCTIONS   Eat foods that have a lot of fiber, such as fruits, vegetables, whole grains, and beans.  Limit foods high in fat and processed sugars, such as french fries, hamburgers, cookies, candies, and soda.   A fiber supplement may be added to your diet if you cannot get enough fiber from foods.   Drink enough fluids to keep your urine clear or pale yellow.   Exercise regularly or as directed by your health care provider.   Go to the restroom when you have the urge to go. Do not hold it.   Only take over-the-counter or prescription medicines as directed by your health care provider. Do not take other medicines for constipation without talking to your health care provider first.  SEEK IMMEDIATE MEDICAL CARE IF:   You have bright red blood in your stool.   Your constipation lasts for more than 4 days or gets worse.   You have abdominal or rectal pain.   You have thin, pencil-like stools.   You have unexplained weight loss. MAKE SURE YOU:   Understand these instructions.  Will watch your condition.  Will get help right away if  you are not doing well or get worse.   This information is not intended to replace advice given to you by your health care provider. Make sure you discuss any questions you have with your health care provider.   Document Released: 03/17/2004 Document Revised: 07/10/2014 Document Reviewed: 03/31/2013 Elsevier Interactive Patient Education [employer]2016 Elsevier Inc.

## 2015-09-18 DIAGNOSIS — L03114 Cellulitis of left upper limb: Secondary | ICD-10-CM | POA: Diagnosis not present

## 2015-09-20 ENCOUNTER — Ambulatory Visit (INDEPENDENT_AMBULATORY_CARE_PROVIDER_SITE_OTHER): Payer: Medicare Other | Admitting: Family Medicine

## 2015-09-20 ENCOUNTER — Encounter: Payer: Self-pay | Admitting: Family Medicine

## 2015-09-20 VITALS — BP 105/64 | HR 69 | Temp 98.6°F | Wt 242.0 lb

## 2015-09-20 DIAGNOSIS — L500 Allergic urticaria: Secondary | ICD-10-CM

## 2015-09-20 DIAGNOSIS — L0390 Cellulitis, unspecified: Secondary | ICD-10-CM | POA: Diagnosis not present

## 2015-09-20 HISTORY — DX: Allergic urticaria: L50.0

## 2015-09-20 HISTORY — DX: Cellulitis, unspecified: L03.90

## 2015-09-20 MED ORDER — RANITIDINE HCL 150 MG PO TABS: 150.0000 mg | ORAL_TABLET | Freq: Two times a day (BID) | ORAL | Status: DC

## 2015-09-20 MED ORDER — AMOXICILLIN-POT CLAVULANATE 875-125 MG PO TABS
1.0000 | ORAL_TABLET | Freq: Two times a day (BID) | ORAL | Status: DC
Start: 2015-09-20 — End: 2015-10-11

## 2015-09-20 MED ORDER — CETIRIZINE HCL 10 MG PO TABS: 10.0000 mg | ORAL_TABLET | Freq: Every day | ORAL | Status: DC

## 2015-09-20 MED ORDER — MUPIROCIN 2 % EX OINT: 1.0000 "application " | TOPICAL_OINTMENT | Freq: Two times a day (BID) | CUTANEOUS | Status: DC

## 2015-09-20 NOTE — Patient Instructions (Signed)
Stop the Doxycycline  Hives Hives are itchy, red, swollen areas of the skin. They can vary in size and location on your body. Hives can come and go for hours or several days (acute hives) or for several weeks (chronic hives). Hives do not spread from person to person (noncontagious). They may get worse with scratching, exercise, and emotional stress. CAUSES   Allergic reaction to food, additives, or drugs.  Infections, including the common cold.  Illness, such as vasculitis, lupus, or thyroid disease.  Exposure to sunlight, heat, or cold.  Exercise.  Stress.  Contact with chemicals. SYMPTOMS   Red or white swollen patches on the skin. The patches may change size, shape, and location quickly and repeatedly.  Itching.  Swelling of the hands, feet, and face. This may occur if hives develop deeper in the skin. DIAGNOSIS  Your caregiver can usually tell what is wrong by performing a physical exam. Skin or blood tests may also be done to determine the cause of your hives. In some cases, the cause cannot be determined. TREATMENT  Mild cases usually get better with medicines such as antihistamines. Severe cases may require an emergency epinephrine injection. If the cause of your hives is known, treatment includes avoiding that trigger.  HOME CARE INSTRUCTIONS   Avoid causes that trigger your hives.  Take antihistamines as directed by your caregiver to reduce the severity of your hives. Non-sedating or low-sedating antihistamines are usually recommended. Do not drive while taking an antihistamine.  Take any other medicines prescribed for itching as directed by your caregiver.  Wear loose-fitting clothing.  Keep all follow-up appointments as directed by your caregiver. SEEK MEDICAL CARE IF:   You have persistent or severe itching that is not relieved with medicine.  You have painful or swollen joints. SEEK IMMEDIATE MEDICAL CARE IF:   You have a fever.  Your tongue or lips are  swollen.  You have trouble breathing or swallowing.  You feel tightness in the throat or chest.  You have abdominal pain. These problems may be the first sign of a life-threatening allergic reaction. Call your local emergency services (911 in [place] MAKE SURE YOU:   Understand these instructions.  Will watch your condition.  Will get help right away if you are not doing well or get worse.   This information is not intended to replace advice given to you by your health care provider. Make sure you discuss any questions you have with your health care provider.   Document Released: 06/19/2005 Document Revised: 06/24/2013 Document Reviewed: 09/12/2011 Elsevier Interactive Patient Education 2016 Elsevier Inc. Cellulitis Cellulitis is an infection of the skin and the tissue beneath it. The infected area is usually red and tender. Cellulitis occurs most often in the arms and lower legs.  CAUSES  Cellulitis is caused by bacteria that enter the skin through cracks or cuts in the skin. The most common types of bacteria that cause cellulitis are staphylococci and streptococci. SIGNS AND SYMPTOMS   Redness and warmth.  Swelling.  Tenderness or pain.  Fever. DIAGNOSIS  Your health care provider can usually determine what is wrong based on a physical exam. Blood tests may also be done. TREATMENT  Treatment usually involves taking an antibiotic medicine. HOME CARE INSTRUCTIONS   Take your antibiotic medicine as directed by your health care provider. Finish the antibiotic even if you start to feel better.  Keep the infected arm or leg elevated to reduce swelling.  Apply a warm cloth to the affected  area up to 4 times per day to relieve pain.  Take medicines only as directed by your health care provider.  Keep all follow-up visits as directed by your health care provider. SEEK MEDICAL CARE IF:   You notice red streaks coming from the infected area.  Your red area gets larger or  turns dark in color.  Your bone or joint underneath the infected area becomes painful after the skin has healed.  Your infection returns in the same area or another area.  You notice a swollen bump in the infected area.  You develop new symptoms.  You have a fever. SEEK IMMEDIATE MEDICAL CARE IF:   You feel very sleepy.  You develop vomiting or diarrhea.  You have a general ill feeling (malaise) with muscle aches and pains.   This information is not intended to replace advice given to you by your health care provider. Make sure you discuss any questions you have with your health care provider.   Document Released: 03/29/2005 Document Revised: 03/10/2015 Document Reviewed: 09/04/2011 Elsevier Interactive Patient Education [employer] Elsevier Inc.

## 2015-09-20 NOTE — Assessment & Plan Note (Signed)
Is noting rash since starting Doxy, has stopped and started on Augmentin bid. Return or seek care if worsens. Also encouraged to start Zantac and Zyrtec bid for next week

## 2015-09-20 NOTE — Progress Notes (Signed)
Subjective:    Patient ID: Brandon Franklin, male    DOB: 20-Jun-1926, 82 y.o.   MRN: [id number]  Chief Complaint  Patient presents with  . Follow-up    HPI Patient is in today for concerns within redness and swelling on left arm.  Patient also concerned he may have had a reaction to some medication.  Patient having some form of rash and red spots on stomach and legs that were itchy.  Patient started taking doxycyline on Saturday for the cellulitis possible allergic reaction to the doxycycline.  new rash on chest and knees started after initiation of doxycycline. Is better today but was itchy and red yesterday. Denies CP/palp/SOB/HA/congestion/fevers/GI or GU c/o. Taking meds as prescribed    Past Medical History  Diagnosis Date  . Urinary anomaly   . Neck fracture (HCC)   . Depression   . Neuromuscular disorder (HCC)   . Allergy     seasonal  . Arthritis   . CHF (congestive heart failure) (HCC)   . Obesity 04/04/2015  . Low back pain 04/04/2015  . Vitamin B12 deficiency 04/04/2015  . Hyperglycemia 05/09/2015  . 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 Cellulitis 09/20/2015    Past Surgical History  Procedure Laterality Date  . Insertion of suprapubic catheter    . Surgery scrotal / testicular Right     Family History  Problem Relation Age of Onset  . Cancer Sister     Social History   Social History  . Marital Status: Married    Spouse Name: N/A  . Number of Children: N/A  . Years of Education: N/A   Occupational History  . retired    Social History Main Topics  . Smoking status: Never Smoker   . Smokeless tobacco: Not on file  . Alcohol Use: No  . Drug Use: No  . Sexual Activity: No   Other Topics Concern  . Not on file   Social History Narrative    Outpatient Prescriptions Prior to Visit  Medication Sig Dispense Refill  . bisacodyl (DULCOLAX) 5 MG EC tablet Take 5 mg by mouth daily as needed.    . cholecalciferol (VITAMIN D) 1000 UNITS tablet Take 1,000 Units by  mouth daily.    . cyanocobalamin (,VITAMIN B-12,) 1000 MCG/ML injection Inject 1 mL (1,000 mcg total) into the muscle every 30 (thirty) days. 10 mL 1  . furosemide (LASIX) 40 MG tablet Take 40 mg by mouth daily.    Marland Kitchen. gabapentin (NEURONTIN) 100 MG capsule Take 300 mg by mouth 3 (three) times daily.     Marland Kitchen. levothyroxine (SYNTHROID, LEVOTHROID) 25 MCG tablet Take 1 tablet (25 mcg total) by mouth daily before breakfast. 30 tablet 8  . methadone (DOLOPHINE) 5 MG tablet Take 0.5 tablets (2.5 mg total) by mouth every 8 (eight) hours. 45 tablet 0  . oxybutynin (DITROPAN) 5 MG tablet Take 10 mg by mouth daily.     Marland Kitchen. senna-docusate (SENOKOT-S) 8.6-50 MG per tablet Take 4 tablets by mouth 2 (two) times daily.    . Syringe/Needle, Disp, (SYRINGE 3CC/25GX1") 25G X 1" 3 ML MISC To use with vitamin B12 50 each 1   No facility-administered medications prior to visit.    Allergies  Allergen Reactions  . Ciprofloxacin   . Levaquin [Levofloxacin In D5w] Other (See Comments)  . Doxycycline Hyclate Rash    ROS     Objective:    Physical Exam  BP 105/64 mmHg  Pulse 69  Temp(Src) 98.6 F (37 C) (  Oral)  Wt 242 lb (109.77 kg)  SpO2 98% Wt Readings from Last 3 Encounters:  09/20/15 242 lb (109.77 kg)  09/13/15 240 lb (108.863 kg)  08/17/15 242 lb (109.77 kg)     Lab Results  Component Value Date   WBC 4.4 03/26/2015   HGB 12.9* 03/26/2015   HCT 39.1 03/26/2015   PLT 164.0 03/26/2015   GLUCOSE 106* 03/26/2015   ALT 4 03/26/2015   AST 8 03/26/2015   NA 138 03/26/2015   K 3.8 03/26/2015   CL 103 03/26/2015   CREATININE 0.79 03/26/2015   BUN 13 03/26/2015   CO2 30 03/26/2015   TSH 4.47 03/26/2015   INR 1.1 05/17/2008    Lab Results  Component Value Date   TSH 4.47 03/26/2015   Lab Results  Component Value Date   WBC 4.4 03/26/2015   HGB 12.9* 03/26/2015   HCT 39.1 03/26/2015   MCV 89.6 03/26/2015   PLT 164.0 03/26/2015   Lab Results  Component Value Date   NA 138 03/26/2015     K 3.8 03/26/2015   CO2 30 03/26/2015   GLUCOSE 106* 03/26/2015   BUN 13 03/26/2015   CREATININE 0.79 03/26/2015   BILITOT 0.5 03/26/2015   ALKPHOS 52 03/26/2015   AST 8 03/26/2015   ALT 4 03/26/2015   PROT 6.8 03/26/2015   ALBUMIN 3.4* 03/26/2015   CALCIUM 8.7 03/26/2015   GFR 98.48 03/26/2015   No results found for: CHOL No results found for: HDL No results found for: LDLCALC No results found for: TRIG No results found for: CHOLHDL No results found for: ZOXW9U     Assessment & Plan:   Problem List Items Addressed This Visit    Allergic urticaria - Primary    Is noting rash since starting Doxy, has stopped and started on Augmentin bid. Return or seek care if worsens. Also encouraged to start Zantac and Zyrtec bid for next week      Cellulitis    Left upper arm d/c Doxycyline and start Augmentin bid report worsening and seek care if spreads quickly, fevers or mental status changes occur.         I am having Mr. Butrick start on amoxicillin-clavulanate, ranitidine, cetirizine, and mupirocin ointment. I am also having him maintain his gabapentin, bisacodyl, cholecalciferol, oxybutynin, furosemide, senna-docusate, levothyroxine, cyanocobalamin, SYRINGE 3CC/25GX1", and methadone.  Meds ordered this encounter  Medications  . amoxicillin-clavulanate (AUGMENTIN) 875-125 MG tablet    Sig: Take 1 tablet by mouth 2 (two) times daily.    Dispense:  20 tablet    Refill:  0  . ranitidine (ZANTAC) 150 MG tablet    Sig: Take 1 tablet (150 mg total) by mouth 2 (two) times daily. x1 week    Dispense:  14 tablet    Refill:  0  . cetirizine (ZYRTEC) 10 MG tablet    Sig: Take 1 tablet (10 mg total) by mouth daily. X 1 week    Dispense:  14 tablet    Refill:  0  . mupirocin ointment (BACTROBAN) 2 %    Sig: Place 1 application into the nose 2 (two) times daily.    Dispense:  22 g    Refill:  0     Danise Edge, MD

## 2015-09-20 NOTE — Assessment & Plan Note (Signed)
Left upper arm d/c Doxycyline and start Augmentin bid report worsening and seek care if spreads quickly, fevers or mental status changes occur.

## 2015-09-20 NOTE — Progress Notes (Signed)
Pre visit review using our clinic review tool, if applicable. No additional management support is needed unless otherwise documented below in the visit note. 

## 2015-09-21 ENCOUNTER — Telehealth: Payer: Self-pay | Admitting: Family Medicine

## 2015-09-21 DIAGNOSIS — Z79899 Other long term (current) drug therapy: Secondary | ICD-10-CM | POA: Diagnosis not present

## 2015-09-21 DIAGNOSIS — L0291 Cutaneous abscess, unspecified: Secondary | ICD-10-CM | POA: Diagnosis not present

## 2015-09-21 DIAGNOSIS — N3000 Acute cystitis without hematuria: Secondary | ICD-10-CM | POA: Diagnosis not present

## 2015-09-21 DIAGNOSIS — D696 Thrombocytopenia, unspecified: Secondary | ICD-10-CM | POA: Diagnosis not present

## 2015-09-21 DIAGNOSIS — G9340 Encephalopathy, unspecified: Secondary | ICD-10-CM | POA: Diagnosis not present

## 2015-09-21 DIAGNOSIS — A4159 Other Gram-negative sepsis: Secondary | ICD-10-CM | POA: Diagnosis not present

## 2015-09-21 DIAGNOSIS — E039 Hypothyroidism, unspecified: Secondary | ICD-10-CM | POA: Diagnosis not present

## 2015-09-21 DIAGNOSIS — F0390 Unspecified dementia without behavioral disturbance: Secondary | ICD-10-CM | POA: Diagnosis present

## 2015-09-21 DIAGNOSIS — B964 Proteus (mirabilis) (morganii) as the cause of diseases classified elsewhere: Secondary | ICD-10-CM | POA: Diagnosis not present

## 2015-09-21 DIAGNOSIS — N319 Neuromuscular dysfunction of bladder, unspecified: Secondary | ICD-10-CM | POA: Diagnosis not present

## 2015-09-21 DIAGNOSIS — B961 Klebsiella pneumoniae [K. pneumoniae] as the cause of diseases classified elsewhere: Secondary | ICD-10-CM | POA: Diagnosis not present

## 2015-09-21 DIAGNOSIS — R509 Fever, unspecified: Secondary | ICD-10-CM | POA: Diagnosis not present

## 2015-09-21 DIAGNOSIS — A419 Sepsis, unspecified organism: Secondary | ICD-10-CM | POA: Diagnosis not present

## 2015-09-21 DIAGNOSIS — L03114 Cellulitis of left upper limb: Secondary | ICD-10-CM | POA: Diagnosis not present

## 2015-09-21 DIAGNOSIS — Z881 Allergy status to other antibiotic agents status: Secondary | ICD-10-CM | POA: Diagnosis not present

## 2015-09-21 DIAGNOSIS — N390 Urinary tract infection, site not specified: Secondary | ICD-10-CM | POA: Diagnosis not present

## 2015-09-21 MED ORDER — FUROSEMIDE 40 MG PO TABS: 40.0000 mg | ORAL_TABLET | Freq: Every day | ORAL | Status: DC

## 2015-09-21 NOTE — Telephone Encounter (Signed)
RN from [facility] called to inform patient has a temp 101, arm is redder and hotter, patient has been in bed all day.  Advise on what to do with this patient. [facility] is needing our office to follow-up with the family. PCP has advised the patient needs to go to the ER. Called the wife Brandon Franklin and informed PCP instructed to get the patient to the Hospital.  The wife agreed and will do asap.

## 2015-09-21 NOTE — Telephone Encounter (Signed)
Pt's daughter called in to inform PCP that pt decided to go to the ED.

## 2015-09-21 NOTE — Telephone Encounter (Signed)
Pt's daughter called in because she says that her father was diagnosed with cellulitis and is now experiencing a fever. Transferred call to [facility] to speak with a nurse to be advised further.

## 2015-09-21 NOTE — Telephone Encounter (Signed)
[facility] [place] Day - Client TELEPHONE ADVICE RECORD   [facility]     Patient Name: Nathanial RancherVERNON Riehl Initial Comment Caller states daughter says her 80 yo Dad was diagnosed with cellulitis in left arm yesterday by Dr. Abner GreenspanBlyth, now he is running fever of 101, been in bed all day.  DOB: [birth year]      Nurse Assessment  Nurse: Tera Materowan, RN, Elnita Maxwellheryl Date/Time (Eastern Time): 09/21/2015 4:30:38 PM  Confirm and document reason for call. If symptomatic, describe symptoms. You must click the next button to save text entered. ---Caller states that her father was dx with cellulitis on Saturday and placed on doxycycline and clindamycin. Pt had reaction to doxycycline and was seen at the office yesterday and he was started on augmentin. Wife states that today the arm is redder and hotter to touch plus pt has a temp of 101.0. Pt has been in bed all day and is not talking much but wife states that he is oriented.  Has the patient traveled out of the country within the last 30 days? ---Not Applicable  Does the patient have any new or worsening symptoms? ---Yes  Will a triage be completed? ---Yes  Related visit to physician within the last 2 weeks? ---Yes  Does the PT have any chronic conditions? (i.e. diabetes, asthma, etc.) ---Yes  List chronic conditions. ---chronic neck problems  Is this a behavioral health or substance abuse call? ---No    Guidelines     Guideline Title Affirmed Question Affirmed Notes   Infection on Antibiotic Follow-up Call [1] Taking antibiotics > 24 hours AND [2] symptoms WORSE    Final Disposition User   Call PCP Now Tera Materowan, RN, Elnita Maxwellheryl   Comments   Spoke to [place] in the office and info on pt given. She will speak with Dr Abner GreenspanBlyth after she comes out from seeing the pt and they will call Lucendia HerrlichFaye his wife back. Lucendia HerrlichFaye and dtr Adamsindy given this info. Instructed to call back if they do not receive a call.   Referrals   GO TO FACILITY UNDECIDED   Disagree/Comply:  Comply

## 2015-09-21 NOTE — Telephone Encounter (Signed)
Pt's spouse called in to request a refill on pt's furosemide  Rx.    [id number] [place], [place] - [address]

## 2015-09-21 NOTE — Telephone Encounter (Signed)
Patient informed refill done 

## 2015-09-22 ENCOUNTER — Telehealth: Payer: Self-pay | Admitting: *Deleted

## 2015-09-22 NOTE — Telephone Encounter (Signed)
Error. Check of [facility] note.

## 2015-09-27 DIAGNOSIS — Z9359 Other cystostomy status: Secondary | ICD-10-CM | POA: Diagnosis not present

## 2015-09-27 DIAGNOSIS — N390 Urinary tract infection, site not specified: Secondary | ICD-10-CM | POA: Diagnosis not present

## 2015-09-27 DIAGNOSIS — E039 Hypothyroidism, unspecified: Secondary | ICD-10-CM | POA: Diagnosis not present

## 2015-09-27 DIAGNOSIS — G9349 Other encephalopathy: Secondary | ICD-10-CM | POA: Diagnosis not present

## 2015-09-27 DIAGNOSIS — N319 Neuromuscular dysfunction of bladder, unspecified: Secondary | ICD-10-CM | POA: Diagnosis not present

## 2015-09-27 DIAGNOSIS — L03114 Cellulitis of left upper limb: Secondary | ICD-10-CM | POA: Diagnosis not present

## 2015-09-27 DIAGNOSIS — F0390 Unspecified dementia without behavioral disturbance: Secondary | ICD-10-CM | POA: Diagnosis not present

## 2015-09-27 NOTE — Telephone Encounter (Signed)
Dr. Abner GreenspanBlyth. Pt recent hospitalized. They send me his admit note. Looks like you are his pcp. Just wanted you to be aware.

## 2015-09-28 ENCOUNTER — Ambulatory Visit: Payer: Medicare Other | Admitting: Medical

## 2015-09-28 DIAGNOSIS — N319 Neuromuscular dysfunction of bladder, unspecified: Secondary | ICD-10-CM | POA: Diagnosis not present

## 2015-09-28 DIAGNOSIS — L03114 Cellulitis of left upper limb: Secondary | ICD-10-CM | POA: Diagnosis not present

## 2015-09-28 DIAGNOSIS — N390 Urinary tract infection, site not specified: Secondary | ICD-10-CM | POA: Diagnosis not present

## 2015-09-28 DIAGNOSIS — E039 Hypothyroidism, unspecified: Secondary | ICD-10-CM | POA: Diagnosis not present

## 2015-09-28 DIAGNOSIS — G9349 Other encephalopathy: Secondary | ICD-10-CM | POA: Diagnosis not present

## 2015-09-28 DIAGNOSIS — F0390 Unspecified dementia without behavioral disturbance: Secondary | ICD-10-CM | POA: Diagnosis not present

## 2015-09-30 DIAGNOSIS — G9349 Other encephalopathy: Secondary | ICD-10-CM | POA: Diagnosis not present

## 2015-09-30 DIAGNOSIS — N319 Neuromuscular dysfunction of bladder, unspecified: Secondary | ICD-10-CM | POA: Diagnosis not present

## 2015-09-30 DIAGNOSIS — N390 Urinary tract infection, site not specified: Secondary | ICD-10-CM | POA: Diagnosis not present

## 2015-09-30 DIAGNOSIS — L03114 Cellulitis of left upper limb: Secondary | ICD-10-CM | POA: Diagnosis not present

## 2015-09-30 DIAGNOSIS — E039 Hypothyroidism, unspecified: Secondary | ICD-10-CM | POA: Diagnosis not present

## 2015-09-30 DIAGNOSIS — F0390 Unspecified dementia without behavioral disturbance: Secondary | ICD-10-CM | POA: Diagnosis not present

## 2015-10-01 ENCOUNTER — Telehealth: Payer: Self-pay | Admitting: Family Medicine

## 2015-10-01 DIAGNOSIS — F0390 Unspecified dementia without behavioral disturbance: Secondary | ICD-10-CM | POA: Diagnosis not present

## 2015-10-01 DIAGNOSIS — N319 Neuromuscular dysfunction of bladder, unspecified: Secondary | ICD-10-CM | POA: Diagnosis not present

## 2015-10-01 DIAGNOSIS — E039 Hypothyroidism, unspecified: Secondary | ICD-10-CM | POA: Diagnosis not present

## 2015-10-01 DIAGNOSIS — L03114 Cellulitis of left upper limb: Secondary | ICD-10-CM | POA: Diagnosis not present

## 2015-10-01 DIAGNOSIS — G9349 Other encephalopathy: Secondary | ICD-10-CM | POA: Diagnosis not present

## 2015-10-01 DIAGNOSIS — N390 Urinary tract infection, site not specified: Secondary | ICD-10-CM | POA: Diagnosis not present

## 2015-10-01 NOTE — Telephone Encounter (Signed)
[facility] and to fax over ordrers.

## 2015-10-01 NOTE — Telephone Encounter (Signed)
Brandon MarketMarcia Franklin - Occupational therapist  She called in to request verbal orders for social work.    CB: [phone]

## 2015-10-01 NOTE — Telephone Encounter (Signed)
Ok to give verbal order in Dr. Mariel AloeBlyth's absence. Have them fax over written orders for her review and signature.

## 2015-10-04 ENCOUNTER — Telehealth: Payer: Self-pay | Admitting: Family Medicine

## 2015-10-04 DIAGNOSIS — E039 Hypothyroidism, unspecified: Secondary | ICD-10-CM | POA: Diagnosis not present

## 2015-10-04 DIAGNOSIS — N319 Neuromuscular dysfunction of bladder, unspecified: Secondary | ICD-10-CM | POA: Diagnosis not present

## 2015-10-04 DIAGNOSIS — N390 Urinary tract infection, site not specified: Secondary | ICD-10-CM | POA: Diagnosis not present

## 2015-10-04 DIAGNOSIS — F0390 Unspecified dementia without behavioral disturbance: Secondary | ICD-10-CM | POA: Diagnosis not present

## 2015-10-04 DIAGNOSIS — A419 Sepsis, unspecified organism: Secondary | ICD-10-CM | POA: Diagnosis not present

## 2015-10-04 DIAGNOSIS — L03114 Cellulitis of left upper limb: Secondary | ICD-10-CM | POA: Diagnosis not present

## 2015-10-04 DIAGNOSIS — G9349 Other encephalopathy: Secondary | ICD-10-CM | POA: Diagnosis not present

## 2015-10-04 NOTE — Telephone Encounter (Signed)
Caller name: Arline AspCindy Relationship to patient: daughter Can be reached: [phone]  Reason for call: Pt daughter called because pt realized that he has been taking clindamycin 1 pill 3/day but was supposed to be taking 3 pills 3/day for a total of 9/day. Concerned on what to do for pt med dosage at this point. Requesting call.  Ok to return call to MorganFaye, pt wife at home # [phone]  Arliss Journeydvised Cindy that she is not on DPR.

## 2015-10-04 NOTE — Telephone Encounter (Signed)
Called left message to call back 

## 2015-10-04 NOTE — Telephone Encounter (Signed)
Called the patients wife back to inform PCP ok to take his medication as prescribed from the hospital.  The wife informed she would follow instructions correctly.

## 2015-10-04 NOTE — Telephone Encounter (Signed)
Spoke to PCP gave verbal ok for portable chest xray.  Will fax this note to [phone] Also PCP did order a cmp and cbc with diff as patients wife has not been giving the patient his clindamycin correctly.  PCP concerned he may be sepsis. Ordered with [facility] Blood test requested.

## 2015-10-04 NOTE — Telephone Encounter (Signed)
[facility] to the home today. Has heard wheezing, expiratory wheezing in all lung fields,rawls right lower lobe,  appetite good, Sudden weakness, family had to call EMS over the weekend to have him moved from wheel chair to the bed due to weakness, urine is clear, no fever, BP today standing 164/90 and sitting 138/80, pulse ox 95%.  RN concerned regarding lung sound changes and patient having sudden weakness. [facility] does have a portable chest xray they could order and  Bring to the home to do.  It is difficult getting the patient

## 2015-10-05 ENCOUNTER — Other Ambulatory Visit: Payer: Self-pay | Admitting: Family Medicine

## 2015-10-05 ENCOUNTER — Telehealth: Payer: Self-pay | Admitting: *Deleted

## 2015-10-05 DIAGNOSIS — R062 Wheezing: Secondary | ICD-10-CM | POA: Diagnosis not present

## 2015-10-05 DIAGNOSIS — L03114 Cellulitis of left upper limb: Secondary | ICD-10-CM | POA: Diagnosis not present

## 2015-10-05 DIAGNOSIS — F0390 Unspecified dementia without behavioral disturbance: Secondary | ICD-10-CM | POA: Diagnosis not present

## 2015-10-05 DIAGNOSIS — G9349 Other encephalopathy: Secondary | ICD-10-CM | POA: Diagnosis not present

## 2015-10-05 DIAGNOSIS — N319 Neuromuscular dysfunction of bladder, unspecified: Secondary | ICD-10-CM | POA: Diagnosis not present

## 2015-10-05 DIAGNOSIS — E039 Hypothyroidism, unspecified: Secondary | ICD-10-CM | POA: Diagnosis not present

## 2015-10-05 DIAGNOSIS — N390 Urinary tract infection, site not specified: Secondary | ICD-10-CM | POA: Diagnosis not present

## 2015-10-05 MED ORDER — POTASSIUM CHLORIDE CRYS ER 20 MEQ PO TBCR: 20.0000 meq | EXTENDED_RELEASE_TABLET | Freq: Two times a day (BID) | ORAL | Status: DC

## 2015-10-05 MED ORDER — GABAPENTIN 100 MG PO CAPS: 300.0000 mg | ORAL_CAPSULE | Freq: Three times a day (TID) | ORAL | Status: DC

## 2015-10-05 NOTE — Telephone Encounter (Signed)
Received Home Health Certification and Plan of Care; forwarded to provider/SLS 04/04

## 2015-10-05 NOTE — Telephone Encounter (Signed)
Can be reached: [phone] [id number] [place], [place] - [address]  Reason for call: Brandon HerrlichFaye called for refill on gabapentin. Has 1-2 days left. Has to go to the drug store for another RX today. Please send in for her.

## 2015-10-05 NOTE — Telephone Encounter (Signed)
PCP received blood test results today 10/05/2015 done yesterday 10/04/2015. Potassium was low.  Sent in KCL 20 meq bid times 7 days #14 0 refills per PCP instructions/recheck cmp in one week, [facility] will do.Jeanene Erb. Called the patient informed his wife of results/instructions. Called Misty [facility]Lisa [facility] with [facility] left detailed message of result/instructions per PCP from lab results, to call me back to confirm message received.

## 2015-10-06 DIAGNOSIS — E039 Hypothyroidism, unspecified: Secondary | ICD-10-CM | POA: Diagnosis not present

## 2015-10-06 DIAGNOSIS — L03114 Cellulitis of left upper limb: Secondary | ICD-10-CM | POA: Diagnosis not present

## 2015-10-06 DIAGNOSIS — N390 Urinary tract infection, site not specified: Secondary | ICD-10-CM | POA: Diagnosis not present

## 2015-10-06 DIAGNOSIS — G9349 Other encephalopathy: Secondary | ICD-10-CM | POA: Diagnosis not present

## 2015-10-06 DIAGNOSIS — F0390 Unspecified dementia without behavioral disturbance: Secondary | ICD-10-CM | POA: Diagnosis not present

## 2015-10-06 DIAGNOSIS — N319 Neuromuscular dysfunction of bladder, unspecified: Secondary | ICD-10-CM | POA: Diagnosis not present

## 2015-10-07 ENCOUNTER — Telehealth: Payer: Self-pay | Admitting: Family Medicine

## 2015-10-07 ENCOUNTER — Ambulatory Visit (INDEPENDENT_AMBULATORY_CARE_PROVIDER_SITE_OTHER): Payer: Medicare Other | Admitting: Medical

## 2015-10-07 ENCOUNTER — Encounter: Payer: Self-pay | Admitting: Medical

## 2015-10-07 VITALS — BP 104/70 | HR 68 | Temp 97.2°F | Ht 68.0 in | Wt 242.0 lb

## 2015-10-07 DIAGNOSIS — E871 Hypo-osmolality and hyponatremia: Secondary | ICD-10-CM

## 2015-10-07 DIAGNOSIS — Z22322 Carrier or suspected carrier of Methicillin resistant Staphylococcus aureus: Secondary | ICD-10-CM | POA: Diagnosis not present

## 2015-10-07 DIAGNOSIS — N390 Urinary tract infection, site not specified: Secondary | ICD-10-CM | POA: Diagnosis not present

## 2015-10-07 DIAGNOSIS — L989 Disorder of the skin and subcutaneous tissue, unspecified: Secondary | ICD-10-CM | POA: Diagnosis not present

## 2015-10-07 DIAGNOSIS — D649 Anemia, unspecified: Secondary | ICD-10-CM

## 2015-10-07 LAB — POC URINALSYSI DIPSTICK (AUTOMATED)
BILIRUBIN UA: NEGATIVE
GLUCOSE UA: NEGATIVE
Ketones, UA: NEGATIVE
Leukocytes, UA: NEGATIVE
NITRITE UA: NEGATIVE
Protein, UA: NEGATIVE
Spec Grav, UA: 1.015
Urobilinogen, UA: 0.2
pH, UA: 8

## 2015-10-07 MED ORDER — CLINDAMYCIN HCL 150 MG PO CAPS: 150.0000 mg | ORAL_CAPSULE | Freq: Three times a day (TID) | ORAL | Status: DC

## 2015-10-07 NOTE — Progress Notes (Signed)
Subjective:    Patient ID: Brandon Franklin, male    DOB: [birth year], 82 y.o.   MRN: [id number]  HPI   Pt recently at [facility]. He was in hospital for 5 days then discharged. He had fever that day and was confused.  Pt may have had sepsis on review of care everywhere. Admitting clinical impression was sepsis. Had cellulitis of arm and uti.  Atelectasis or pneumonia on chest xray.   Mild low na on 09-21-2015.   Blood culture was eventually negative.   Urine culture did show klebsiella and proteus. Appeared sensitive to macrobid.(pt has supraubic catheter)  Pt since discharge feels better but some mild fatigue but overall better Pt did have some mrsa found on his arm. Pt was found to have  mrsa and place on clindamycin. Pt has no diarrhea.   Pt has irregularly appearance to skin behind rt ear. At one point it drained after UC may have I and D'd. The area has been present for about 3 weeks.    Review of Systems  Constitutional: Positive for fatigue. Negative for fever and chills.  HENT: Negative for congestion, ear pain, mouth sores, nosebleeds, postnasal drip and sinus pressure.   Respiratory: Negative for cough, chest tightness, shortness of breath and wheezing.   Cardiovascular: Negative for chest pain and palpitations.  Genitourinary: Negative for dysuria, urgency, flank pain, penile swelling and penile pain.  Musculoskeletal: Negative for back pain.  Skin:       Skin lesion  Neurological: Negative for dizziness and headaches.  Hematological: Negative for adenopathy. Does not bruise/bleed easily.  Psychiatric/Behavioral: Negative for behavioral problems and confusion.   Past Medical History  Diagnosis Date  . Urinary anomaly   . Neck fracture (HCC)   . Depression   . Neuromuscular disorder (HCC)   . Allergy     seasonal  . Arthritis   . CHF (congestive heart failure) (HCC)   . Obesity 04/04/2015  . Low back pain 04/04/2015  . Vitamin B12 deficiency 04/04/2015    . Hyperglycemia 05/09/2015  . 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 Cellulitis 09/20/2015    Social History   Social History  . Marital Status: Married    Spouse Name: N/A  . Number of Children: N/A  . Years of Education: N/A   Occupational History  . retired    Social History Main Topics  . Smoking status: Never Smoker   . Smokeless tobacco: Not on file  . Alcohol Use: No  . Drug Use: No  . Sexual Activity: No   Other Topics Concern  . Not on file   Social History Narrative    Past Surgical History  Procedure Laterality Date  . Insertion of suprapubic catheter    . Surgery scrotal / testicular Right     Family History  Problem Relation Age of Onset  . Cancer Sister     Allergies  Allergen Reactions  . Ciprofloxacin   . Levaquin [Levofloxacin In D5w] Other (See Comments)  . Doxycycline Hyclate Rash    Current Outpatient Prescriptions on File Prior to Visit  Medication Sig Dispense Refill  . bisacodyl (DULCOLAX) 5 MG EC tablet Take 5 mg by mouth daily as needed.    . cetirizine (ZYRTEC) 10 MG tablet Take 1 tablet (10 mg total) by mouth daily. X 1 week 14 tablet 0  . cholecalciferol (VITAMIN D) 1000 UNITS tablet Take 1,000 Units by mouth daily.    . cyanocobalamin (,VITAMIN B-12,) 1000 MCG/ML injection Inject  1 mL (1,000 mcg total) into the muscle every 30 (thirty) days. 10 mL 1  . furosemide (LASIX) 40 MG tablet Take 1 tablet (40 mg total) by mouth daily. 30 tablet 5  . gabapentin (NEURONTIN) 100 MG capsule Take 3 capsules (300 mg total) by mouth 3 (three) times daily. 90 capsule 1  . levothyroxine (SYNTHROID, LEVOTHROID) 25 MCG tablet Take 1 tablet (25 mcg total) by mouth daily before breakfast. 30 tablet 8  . methadone (DOLOPHINE) 5 MG tablet Take 0.5 tablets (2.5 mg total) by mouth every 8 (eight) hours. 45 tablet 0  . mupirocin ointment (BACTROBAN) 2 % Place 1 application into the nose 2 (two) times daily. 22 g 0  . oxybutynin (DITROPAN) 5 MG tablet Take 10 mg  by mouth daily.     . potassium chloride SA (K-DUR,KLOR-CON) 20 MEQ tablet Take 1 tablet (20 mEq total) by mouth 2 (two) times daily. Take for 7 days 14 tablet 0  . ranitidine (ZANTAC) 150 MG tablet Take 1 tablet (150 mg total) by mouth 2 (two) times daily. x1 week 14 tablet 0  . senna-docusate (SENOKOT-S) 8.6-50 MG per tablet Take 4 tablets by mouth 2 (two) times daily.    . Syringe/Needle, Disp, (SYRINGE 3CC/25GX1") 25G X 1" 3 ML MISC To use with vitamin B12 50 each 1  . amoxicillin-clavulanate (AUGMENTIN) 875-125 MG tablet Take 1 tablet by mouth 2 (two) times daily. (Patient not taking: Reported on 10/07/2015) 20 tablet 0   No current facility-administered medications on file prior to visit.    BP 104/70 mmHg  Pulse 68  Temp(Src) 97.2 F (36.2 C) (Oral)  Ht 5\' 8"  (1.727 m)  Wt 242 lb (109.77 kg)  BMI 36.80 kg/m2  SpO2 92%       Objective:   Physical Exam  General Mental Status- Alert. General Appearance- Not in acute distress.   Skin General: Color- Normal Color. Moisture- Normal Moisture. Left arm. No redness, no swelling. Non tender over prior skin infected area. Behind rt ear raised area of skin very irregular appearance. Mild pink. Not warm. Not tender. No dc.  Neck Carotid Arteries- Normal color. Moisture- Normal Moisture. No carotid bruits. No JVD.  Chest and Lung Exam Auscultation: Breath Sounds:-Normal.  Cardiovascular Auscultation:Rythm- Regular. Murmurs & Other Heart Sounds:Auscultation of the heart reveals- No Murmurs.  Abdomen Inspection:-Inspeection Normal. Palpation/Percussion:Note:No mass. Palpation and Percussion of the abdomen reveal- Non Tender, Non Distended + BS, no rebound or guarding.    Neurologic Cranial Nerve exam:- CN III-XII intact(No nystagmus), symmetric smile. Strength:- 5/5 equal and symmetric strength both upper and lower extremities.      Assessment & Plan:  For recent sespis, skin infection, uti, low na, and peristing fatigue,  I will order cbc, cmp, and urine culture.  For area behind ear will rx 3 days of clindamycin low dose for possible infection. But advise probiotics otc. But also refer to dermatologist. You may need I and D. But also possible biospy. During interim do warm compresses as well.  Follow up in 7 days with us or as needed.  Note giving low dose clindamycin for area behind ear. Not entirely convinced area of infection.

## 2015-10-07 NOTE — Patient Instructions (Addendum)
For recent sespis, skin infection, uti, low na, and peristing fatigue, I will order cbc, cmp, and urine culture.  For area behind ear will rx 3 days of clindamycin low dose for possible infection. But advise probiotics otc. But also refer to dermatologist. You may need I and D. But also possible biospy. During interim do warm compresses as well.  Follow up in 7 days with us or as needed.

## 2015-10-07 NOTE — Progress Notes (Signed)
Pre visit review using our clinic review tool, if applicable. No additional management support is needed unless otherwise documented below in the visit note. 

## 2015-10-07 NOTE — Telephone Encounter (Signed)
PCP received Chest [facility] from [facility] done on 10/05/2015.  Results were negative. Called Corinna GabFaye Desch the patients wife informed of results.

## 2015-10-08 DIAGNOSIS — L03114 Cellulitis of left upper limb: Secondary | ICD-10-CM | POA: Diagnosis not present

## 2015-10-08 DIAGNOSIS — F0390 Unspecified dementia without behavioral disturbance: Secondary | ICD-10-CM | POA: Diagnosis not present

## 2015-10-08 DIAGNOSIS — G9349 Other encephalopathy: Secondary | ICD-10-CM | POA: Diagnosis not present

## 2015-10-08 DIAGNOSIS — E039 Hypothyroidism, unspecified: Secondary | ICD-10-CM | POA: Diagnosis not present

## 2015-10-08 DIAGNOSIS — N390 Urinary tract infection, site not specified: Secondary | ICD-10-CM | POA: Diagnosis not present

## 2015-10-08 DIAGNOSIS — N319 Neuromuscular dysfunction of bladder, unspecified: Secondary | ICD-10-CM | POA: Diagnosis not present

## 2015-10-08 LAB — CBC WITH DIFFERENTIAL/PLATELET
BASOS PCT: 0.3 % (ref 0.0–3.0)
Basophils Absolute: 0 10*3/uL (ref 0.0–0.1)
EOS PCT: 1.9 % (ref 0.0–5.0)
Eosinophils Absolute: 0.1 10*3/uL (ref 0.0–0.7)
HEMATOCRIT: 34.9 % — AB (ref 39.0–52.0)
HEMOGLOBIN: 11.7 g/dL — AB (ref 13.0–17.0)
LYMPHS PCT: 26.2 % (ref 12.0–46.0)
Lymphs Abs: 1 10*3/uL (ref 0.7–4.0)
MCHC: 33.5 g/dL (ref 30.0–36.0)
MCV: 88.3 fl (ref 78.0–100.0)
MONO ABS: 0.6 10*3/uL (ref 0.1–1.0)
MONOS PCT: 17.6 % — AB (ref 3.0–12.0)
Neutro Abs: 2 10*3/uL (ref 1.4–7.7)
Neutrophils Relative %: 54 % (ref 43.0–77.0)
Platelets: 184 10*3/uL (ref 150.0–400.0)
RBC: 3.95 Mil/uL — AB (ref 4.22–5.81)
RDW: 14.6 % (ref 11.5–15.5)
WBC: 3.7 10*3/uL — AB (ref 4.0–10.5)

## 2015-10-08 LAB — COMPREHENSIVE METABOLIC PANEL
ALBUMIN: 3.3 g/dL — AB (ref 3.5–5.2)
ALK PHOS: 49 U/L (ref 39–117)
ALT: 14 U/L (ref 0–53)
AST: 25 U/L (ref 0–37)
BUN: 11 mg/dL (ref 6–23)
CALCIUM: 8.6 mg/dL (ref 8.4–10.5)
CO2: 32 mEq/L (ref 19–32)
Chloride: 96 mEq/L (ref 96–112)
Creatinine, Ser: 0.77 mg/dL (ref 0.40–1.50)
GFR: 101.31 mL/min (ref >60.00)
Glucose, Bld: 110 mg/dL — ABNORMAL HIGH (ref 70–99)
POTASSIUM: 3.6 meq/L (ref 3.5–5.1)
SODIUM: 137 meq/L (ref 135–145)
TOTAL PROTEIN: 6.8 g/dL (ref 6.0–8.3)
Total Bilirubin: 0.7 mg/dL (ref 0.2–1.2)

## 2015-10-08 NOTE — Telephone Encounter (Signed)
Referral faxed to Dr Rocky LinkSzabo STAT/ awaiting appt

## 2015-10-08 NOTE — Addendum Note (Signed)
Addended by: Neldon LabellaMABE, HOLDEN S on: 10/08/2015 05:24 PM   Modules accepted: Orders

## 2015-10-08 NOTE — Telephone Encounter (Signed)
Please see if we can get Mr. Brandon Franklin in with Dermatology on Monday in [place] per daughter request. She states that driving far is too much for them and he was not able to go today due to not feeling well. Ramon Dredgedward recommends that the patient be seen Monday if possible.

## 2015-10-08 NOTE — Telephone Encounter (Signed)
Spoke with daughter and advised her that we are trying to get her dad in with [place] [facility] and they are booked until the middle of next month. She voices understanding.

## 2015-10-08 NOTE — Telephone Encounter (Signed)
See holdens message

## 2015-10-09 LAB — URINE CULTURE

## 2015-10-10 NOTE — Telephone Encounter (Signed)
Did you get my advisement on his urine. Let me know.

## 2015-10-11 MED ORDER — AMOXICILLIN-POT CLAVULANATE 875-125 MG PO TABS: 1.0000 | ORAL_TABLET | Freq: Two times a day (BID) | ORAL | Status: DC

## 2015-10-11 NOTE — Telephone Encounter (Signed)
Left message for pt to call back and see how the patient is feeling. Per Esperanza RichtersEdward Saguier he may need to change to a stronger antibiotic.

## 2015-10-11 NOTE — Telephone Encounter (Signed)
Advised pt that Rx would be sent in to the pharmacy and also advised pt of the infection and he will stop the the previous medication that was sent in. Pt voices understanding about not taking the two antibiotics together.

## 2015-10-11 NOTE — Telephone Encounter (Signed)
Wife called back. Please call again she is home [phone].

## 2015-10-12 ENCOUNTER — Telehealth: Payer: Self-pay | Admitting: Medical

## 2015-10-12 DIAGNOSIS — F0390 Unspecified dementia without behavioral disturbance: Secondary | ICD-10-CM | POA: Diagnosis not present

## 2015-10-12 DIAGNOSIS — E876 Hypokalemia: Secondary | ICD-10-CM | POA: Diagnosis not present

## 2015-10-12 DIAGNOSIS — L03114 Cellulitis of left upper limb: Secondary | ICD-10-CM | POA: Diagnosis not present

## 2015-10-12 DIAGNOSIS — N319 Neuromuscular dysfunction of bladder, unspecified: Secondary | ICD-10-CM | POA: Diagnosis not present

## 2015-10-12 DIAGNOSIS — G9349 Other encephalopathy: Secondary | ICD-10-CM | POA: Diagnosis not present

## 2015-10-12 DIAGNOSIS — N390 Urinary tract infection, site not specified: Secondary | ICD-10-CM | POA: Diagnosis not present

## 2015-10-12 DIAGNOSIS — E039 Hypothyroidism, unspecified: Secondary | ICD-10-CM | POA: Diagnosis not present

## 2015-10-12 NOTE — Telephone Encounter (Signed)
Caller name: Aram BeechamCynthia Relationship to patient: daughter Can be reached: [phone]  Reason for call: pt daughter calling to find out more info on abx pt was changed to yesterday. Also asking [place] [facility] [place]

## 2015-10-12 NOTE — Telephone Encounter (Signed)
Spoke with Arline AspCindy and she wanted to check on the change of the medication since her mom had thought we changed the medication for the ear and not the urine. I advised the daughter that sometimes it can take a week or more before a phone call is made to the patient and if they have not heard anything within a week to call the office back.  Attempted to reach Mr. Elyn PeersYork three different times and phone was busy to advise them of the referral.

## 2015-10-13 DIAGNOSIS — N390 Urinary tract infection, site not specified: Secondary | ICD-10-CM | POA: Diagnosis not present

## 2015-10-13 DIAGNOSIS — G9349 Other encephalopathy: Secondary | ICD-10-CM | POA: Diagnosis not present

## 2015-10-13 DIAGNOSIS — F0390 Unspecified dementia without behavioral disturbance: Secondary | ICD-10-CM | POA: Diagnosis not present

## 2015-10-13 DIAGNOSIS — L03114 Cellulitis of left upper limb: Secondary | ICD-10-CM | POA: Diagnosis not present

## 2015-10-13 DIAGNOSIS — E039 Hypothyroidism, unspecified: Secondary | ICD-10-CM | POA: Diagnosis not present

## 2015-10-13 DIAGNOSIS — N319 Neuromuscular dysfunction of bladder, unspecified: Secondary | ICD-10-CM | POA: Diagnosis not present

## 2015-10-14 DIAGNOSIS — G9349 Other encephalopathy: Secondary | ICD-10-CM | POA: Diagnosis not present

## 2015-10-14 DIAGNOSIS — N390 Urinary tract infection, site not specified: Secondary | ICD-10-CM | POA: Diagnosis not present

## 2015-10-14 DIAGNOSIS — E039 Hypothyroidism, unspecified: Secondary | ICD-10-CM | POA: Diagnosis not present

## 2015-10-14 DIAGNOSIS — L03114 Cellulitis of left upper limb: Secondary | ICD-10-CM | POA: Diagnosis not present

## 2015-10-14 DIAGNOSIS — N319 Neuromuscular dysfunction of bladder, unspecified: Secondary | ICD-10-CM | POA: Diagnosis not present

## 2015-10-14 DIAGNOSIS — F0390 Unspecified dementia without behavioral disturbance: Secondary | ICD-10-CM | POA: Diagnosis not present

## 2015-10-15 DIAGNOSIS — G9349 Other encephalopathy: Secondary | ICD-10-CM | POA: Diagnosis not present

## 2015-10-15 DIAGNOSIS — N319 Neuromuscular dysfunction of bladder, unspecified: Secondary | ICD-10-CM | POA: Diagnosis not present

## 2015-10-15 DIAGNOSIS — L03114 Cellulitis of left upper limb: Secondary | ICD-10-CM | POA: Diagnosis not present

## 2015-10-15 DIAGNOSIS — F0390 Unspecified dementia without behavioral disturbance: Secondary | ICD-10-CM | POA: Diagnosis not present

## 2015-10-15 DIAGNOSIS — E039 Hypothyroidism, unspecified: Secondary | ICD-10-CM | POA: Diagnosis not present

## 2015-10-15 DIAGNOSIS — N390 Urinary tract infection, site not specified: Secondary | ICD-10-CM | POA: Diagnosis not present

## 2015-10-18 DIAGNOSIS — E039 Hypothyroidism, unspecified: Secondary | ICD-10-CM | POA: Diagnosis not present

## 2015-10-18 DIAGNOSIS — L03114 Cellulitis of left upper limb: Secondary | ICD-10-CM | POA: Diagnosis not present

## 2015-10-18 DIAGNOSIS — F0390 Unspecified dementia without behavioral disturbance: Secondary | ICD-10-CM | POA: Diagnosis not present

## 2015-10-18 DIAGNOSIS — N319 Neuromuscular dysfunction of bladder, unspecified: Secondary | ICD-10-CM | POA: Diagnosis not present

## 2015-10-18 DIAGNOSIS — N390 Urinary tract infection, site not specified: Secondary | ICD-10-CM | POA: Diagnosis not present

## 2015-10-18 DIAGNOSIS — G9349 Other encephalopathy: Secondary | ICD-10-CM | POA: Diagnosis not present

## 2015-10-19 ENCOUNTER — Other Ambulatory Visit: Payer: Self-pay | Admitting: Family Medicine

## 2015-10-19 ENCOUNTER — Telehealth: Payer: Self-pay | Admitting: *Deleted

## 2015-10-19 DIAGNOSIS — E538 Deficiency of other specified B group vitamins: Secondary | ICD-10-CM

## 2015-10-19 MED ORDER — METHADONE HCL 5 MG PO TABS: 2.5000 mg | ORAL_TABLET | Freq: Three times a day (TID) | ORAL | Status: DC

## 2015-10-19 NOTE — Telephone Encounter (Signed)
Last refill 09/02/2015  #45 with 0 refills Last office visit 09/20/2015

## 2015-10-19 NOTE — Telephone Encounter (Signed)
Called the patient informed his wife Brandon Franklin hardcopy is at the front desk.

## 2015-10-19 NOTE — Telephone Encounter (Signed)
Received and forwarded to Dr. [name]. JG//CMA  

## 2015-10-19 NOTE — Telephone Encounter (Signed)
Relation to JX:BJYNpt:self  Call back number:[phone] Pharmacy: [facility] [place], [place] - [address] [phone] (Phone) [phone] (Fax)         Reason for call:  Patient requesting a refill methadone (DOLOPHINE) 5 MG tablet

## 2015-10-20 DIAGNOSIS — G9349 Other encephalopathy: Secondary | ICD-10-CM | POA: Diagnosis not present

## 2015-10-20 DIAGNOSIS — E039 Hypothyroidism, unspecified: Secondary | ICD-10-CM | POA: Diagnosis not present

## 2015-10-20 DIAGNOSIS — N390 Urinary tract infection, site not specified: Secondary | ICD-10-CM | POA: Diagnosis not present

## 2015-10-20 DIAGNOSIS — F0390 Unspecified dementia without behavioral disturbance: Secondary | ICD-10-CM | POA: Diagnosis not present

## 2015-10-20 DIAGNOSIS — L03114 Cellulitis of left upper limb: Secondary | ICD-10-CM | POA: Diagnosis not present

## 2015-10-20 DIAGNOSIS — N319 Neuromuscular dysfunction of bladder, unspecified: Secondary | ICD-10-CM | POA: Diagnosis not present

## 2015-10-21 DIAGNOSIS — N319 Neuromuscular dysfunction of bladder, unspecified: Secondary | ICD-10-CM | POA: Diagnosis not present

## 2015-10-21 DIAGNOSIS — E039 Hypothyroidism, unspecified: Secondary | ICD-10-CM | POA: Diagnosis not present

## 2015-10-21 DIAGNOSIS — N390 Urinary tract infection, site not specified: Secondary | ICD-10-CM | POA: Diagnosis not present

## 2015-10-21 DIAGNOSIS — L03114 Cellulitis of left upper limb: Secondary | ICD-10-CM | POA: Diagnosis not present

## 2015-10-21 DIAGNOSIS — G9349 Other encephalopathy: Secondary | ICD-10-CM | POA: Diagnosis not present

## 2015-10-21 DIAGNOSIS — F0390 Unspecified dementia without behavioral disturbance: Secondary | ICD-10-CM | POA: Diagnosis not present

## 2015-10-21 NOTE — Telephone Encounter (Signed)
Currently with provider.

## 2015-10-21 NOTE — Telephone Encounter (Signed)
Caller name: Morrie [facility]ldonshley [facility] Can be reached: [phone]   Reason for call: calling to f/u on form for wheelchair and copy of face-to-face office visit notes.

## 2015-10-25 DIAGNOSIS — L720 Epidermal cyst: Secondary | ICD-10-CM | POA: Diagnosis not present

## 2015-10-26 NOTE — Telephone Encounter (Signed)
Caller name: Morrie Sheldonshley from [facility]  Call back number:  [phone] ext [phone] and fax # [phone]  Pharmacy:  Reason for call:  Checking on the status of form please fax to [phone]

## 2015-10-27 ENCOUNTER — Encounter: Payer: Self-pay | Admitting: Family Medicine

## 2015-10-27 DIAGNOSIS — N390 Urinary tract infection, site not specified: Secondary | ICD-10-CM | POA: Diagnosis not present

## 2015-10-27 DIAGNOSIS — E039 Hypothyroidism, unspecified: Secondary | ICD-10-CM | POA: Diagnosis not present

## 2015-10-27 DIAGNOSIS — F0390 Unspecified dementia without behavioral disturbance: Secondary | ICD-10-CM | POA: Diagnosis not present

## 2015-10-27 DIAGNOSIS — G9349 Other encephalopathy: Secondary | ICD-10-CM | POA: Diagnosis not present

## 2015-10-27 DIAGNOSIS — N319 Neuromuscular dysfunction of bladder, unspecified: Secondary | ICD-10-CM | POA: Diagnosis not present

## 2015-10-27 DIAGNOSIS — L03114 Cellulitis of left upper limb: Secondary | ICD-10-CM | POA: Diagnosis not present

## 2015-10-28 ENCOUNTER — Other Ambulatory Visit: Payer: Self-pay | Admitting: Family Medicine

## 2015-10-28 MED ORDER — GABAPENTIN 100 MG PO CAPS: 300.0000 mg | ORAL_CAPSULE | Freq: Three times a day (TID) | ORAL | Status: DC

## 2015-10-28 NOTE — Telephone Encounter (Signed)
Refill request for gabapentin   Pharmacy: [id number] - [place], [place] - [address]

## 2015-10-29 MED ORDER — GABAPENTIN 100 MG PO CAPS: 300.0000 mg | ORAL_CAPSULE | Freq: Three times a day (TID) | ORAL | Status: DC

## 2015-10-29 NOTE — Telephone Encounter (Signed)
Spoke to the wife and patient does take Gabapentin 100 mg 3 by mouth tid. PCP ok to verify with wife exact instructions, resent to pharmacy correct refill amount.

## 2015-10-29 NOTE — Addendum Note (Signed)
Addended by: Scharlene GlossEWING, Elany Felix B on: 10/29/2015 09:45 AM   Modules accepted: Orders

## 2015-10-29 NOTE — Addendum Note (Signed)
Addended by: Scharlene GlossEWING, Tawona Filsinger B on: 10/29/2015 01:29 PM   Modules accepted: Orders

## 2015-10-29 NOTE — Telephone Encounter (Signed)
Pharmacist needs clarification on Gabapentin. Previous MD instructions were to take 1 by mouth tid #90, BUT script sent in yesterday instructions are to take 3 by mouth tid?  Pharmacist stated in the past patient taking only 3 per day.  Ok to correct instructions and resend to [facility]

## 2015-11-01 DIAGNOSIS — L720 Epidermal cyst: Secondary | ICD-10-CM | POA: Diagnosis not present

## 2015-11-03 NOTE — Telephone Encounter (Signed)
I do not have this paperwork any longer. I try to sign most paperwork within 48 hours but I definitely do not have this. Not sure where it could be at this time

## 2015-11-03 NOTE — Telephone Encounter (Signed)
Morrie Sheldonshley from [facility] called in to check the status of paperwork sent in. Per previous note, paperwork is with PCP.   Please advise.    [phone]

## 2015-11-04 NOTE — Telephone Encounter (Signed)
[facility] called to check on this again. Advised of notes from Dr Abner GreenspanBlyth. They are going to refax form to [phone] attn: Robin. They are requesting us to return it to them as Mr. Brandon Franklin is in need of a new WC. Nurse states that pts scrotum is at the edge of the seat, it is too narrow for him, and overall needs to be replaced as soon as possible.

## 2015-11-05 ENCOUNTER — Telehealth: Payer: Self-pay | Admitting: *Deleted

## 2015-11-05 NOTE — Telephone Encounter (Signed)
Received fax from [facility] stating that face-to-face encounter with OV notes are needed in order to get patient's new wheelchair order placed per Medicare. Spoke with patient's wife RE: this matter via telephone and we were able to find an Opening on Tuesday, 11/09/15 at 3:45p that would work for patient's transportation needs. [facility] at 3:00[facility] for 1 hour; after getting a hold of [facility] and speaking with [name], she is going to reschedule her appt with patient [at 3p], so that PCP can get face-to-face encounter done and send to [facility] for needed wheelchair. Pt's spouse informed, understood and agreed; provider informed/SLS 05/05

## 2015-11-09 ENCOUNTER — Encounter: Payer: Self-pay | Admitting: Family Medicine

## 2015-11-09 ENCOUNTER — Ambulatory Visit (INDEPENDENT_AMBULATORY_CARE_PROVIDER_SITE_OTHER): Payer: Medicare Other | Admitting: Family Medicine

## 2015-11-09 ENCOUNTER — Ambulatory Visit: Payer: Self-pay | Admitting: *Deleted

## 2015-11-09 VITALS — BP 113/84 | HR 61 | Temp 98.0°F | Ht 68.0 in | Wt 242.0 lb

## 2015-11-09 DIAGNOSIS — R739 Hyperglycemia, unspecified: Secondary | ICD-10-CM | POA: Diagnosis not present

## 2015-11-09 DIAGNOSIS — S12101S Unspecified nondisplaced fracture of second cervical vertebra, sequela: Secondary | ICD-10-CM

## 2015-11-09 NOTE — Progress Notes (Signed)
Pre visit review using our clinic review tool, if applicable. No additional management support is needed unless otherwise documented below in the visit note. 

## 2015-11-09 NOTE — Progress Notes (Signed)
Subjective:    Patient ID: Brandon Franklin, male    DOB: 12-09-1925, 82 y.o.   MRN: [id number]  Chief Complaint  Patient presents with  . Follow-up    face to face for a new wheel chair.    HPI Patient is in today for follow up. He is in need of a new extra wide wheelchair. He continues to be a home with his wife but is essentially wheelchair bound. He can only help with transfers at this time and spends most of his day chair. No other acute concerns. Denies CP/palp/HA/congestion/fevers/GI or GU c/o. Taking meds as prescribed  Past Medical History  Diagnosis Date  . Urinary anomaly   . Neck fracture (HCC)   . Depression   . Neuromuscular disorder (HCC)   . Allergy     seasonal  . Arthritis   . CHF (congestive heart failure) (HCC)   . Obesity 04/04/2015  . Low back pain 04/04/2015  . Vitamin B12 deficiency 04/04/2015  . Hyperglycemia 05/09/2015  . 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 Cellulitis 09/20/2015    Past Surgical History  Procedure Laterality Date  . Insertion of suprapubic catheter    . Surgery scrotal / testicular Right     Family History  Problem Relation Age of Onset  . Cancer Sister     Social History   Social History  . Marital Status: Married    Spouse Name: N/A  . Number of Children: N/A  . Years of Education: N/A   Occupational History  . retired    Social History Main Topics  . Smoking status: Never Smoker   . Smokeless tobacco: Not on file  . Alcohol Use: No  . Drug Use: No  . Sexual Activity: No   Other Topics Concern  . Not on file   Social History Narrative    Outpatient Prescriptions Prior to Visit  Medication Sig Dispense Refill  . bisacodyl (DULCOLAX) 5 MG EC tablet Take 5 mg by mouth daily as needed.    . cholecalciferol (VITAMIN D) 1000 UNITS tablet Take 1,000 Units by mouth daily.    . cyanocobalamin (,VITAMIN B-12,) 1000 MCG/ML injection Inject 1 mL (1,000 mcg total) into the muscle every 30 (thirty) days. 10 mL 1  . furosemide  (LASIX) 40 MG tablet Take 1 tablet (40 mg total) by mouth daily. 30 tablet 5  . gabapentin (NEURONTIN) 100 MG capsule Take 3 capsules (300 mg total) by mouth 3 (three) times daily. 270 capsule 0  . levothyroxine (SYNTHROID, LEVOTHROID) 25 MCG tablet Take 1 tablet (25 mcg total) by mouth daily before breakfast. 30 tablet 8  . methadone (DOLOPHINE) 5 MG tablet Take 0.5 tablets (2.5 mg total) by mouth every 8 (eight) hours. 45 tablet 0  . oxybutynin (DITROPAN) 5 MG tablet Take 10 mg by mouth daily.     Marland Kitchen senna-docusate (SENOKOT-S) 8.6-50 MG per tablet Take 4 tablets by mouth 2 (two) times daily. Only takes as needed.    . Syringe/Needle, Disp, (SYRINGE 3CC/25GX1") 25G X 1" 3 ML MISC To use with vitamin B12 50 each 1  . amoxicillin-clavulanate (AUGMENTIN) 875-125 MG tablet Take 1 tablet by mouth 2 (two) times daily. 20 tablet 0  . cetirizine (ZYRTEC) 10 MG tablet Take 1 tablet (10 mg total) by mouth daily. X 1 week 14 tablet 0  . mupirocin ointment (BACTROBAN) 2 % Place 1 application into the nose 2 (two) times daily. 22 g 0  . potassium chloride SA (K-DUR,KLOR-CON) 20 MEQ  tablet Take 1 tablet (20 mEq total) by mouth 2 (two) times daily. Take for 7 days 14 tablet 0  . ranitidine (ZANTAC) 150 MG tablet Take 1 tablet (150 mg total) by mouth 2 (two) times daily. x1 week 14 tablet 0   No facility-administered medications prior to visit.    Allergies  Allergen Reactions  . Ciprofloxacin   . Levaquin [Levofloxacin In D5w] Other (See Comments)  . Doxycycline Hyclate Rash    Review of Systems  Constitutional: Positive for malaise/fatigue. Negative for fever.  HENT: Negative for congestion.   Eyes: Negative for blurred vision.  Respiratory: Negative for shortness of breath.   Cardiovascular: Negative for chest pain, palpitations and leg swelling.  Gastrointestinal: Negative for nausea, abdominal pain and blood in stool.  Genitourinary: Negative for dysuria and frequency.  Musculoskeletal: Positive  for back pain, joint pain, falls and neck pain.  Skin: Negative for rash.  Neurological: Positive for weakness. Negative for dizziness, loss of consciousness and headaches.  Endo/Heme/Allergies: Negative for environmental allergies.  Psychiatric/Behavioral: Negative for depression. The patient is not nervous/anxious.        Objective:    Physical Exam  Constitutional: He is oriented to person, place, and time. He appears well-developed and well-nourished. No distress.  Obese in wheelchair  HENT:  Head: Normocephalic and atraumatic.  Nose: Nose normal.  Eyes: Right eye exhibits no discharge. Left eye exhibits no discharge.  Neck: Normal range of motion. Neck supple.  Cardiovascular: Normal rate and regular rhythm.   No murmur heard. Pulmonary/Chest: Effort normal and breath sounds normal.  Abdominal: Soft. Bowel sounds are normal. There is no tenderness.  Musculoskeletal: He exhibits edema.  Neurological: He is alert and oriented to person, place, and time.  Skin: Skin is warm and dry.  Psychiatric: He has a normal mood and affect.  Nursing note and vitals reviewed.   BP 113/84 mmHg  Pulse 61  Temp(Src) 98 F (36.7 C) (Oral)  Ht 5\' 8"  (1.727 m)  Wt 242 lb (109.77 kg)  BMI 36.80 kg/m2  SpO2 99% Wt Readings from Last 3 Encounters:  11/09/15 242 lb (109.77 kg)  10/07/15 242 lb (109.77 kg)  09/20/15 242 lb (109.77 kg)     Lab Results  Component Value Date   WBC 3.7* 10/07/2015   HGB 11.7* 10/07/2015   HCT 34.9* 10/07/2015   PLT 184.0 10/07/2015   GLUCOSE 110* 10/07/2015   ALT 14 10/07/2015   AST 25 10/07/2015   NA 137 10/07/2015   K 3.6 10/07/2015   CL 96 10/07/2015   CREATININE 0.77 10/07/2015   BUN 11 10/07/2015   CO2 32 10/07/2015   TSH 4.47 03/26/2015   INR 1.1 05/17/2008    Lab Results  Component Value Date   TSH 4.47 03/26/2015   Lab Results  Component Value Date   WBC 3.7* 10/07/2015   HGB 11.7* 10/07/2015   HCT 34.9* 10/07/2015   MCV 88.3  10/07/2015   PLT 184.0 10/07/2015   Lab Results  Component Value Date   NA 137 10/07/2015   K 3.6 10/07/2015   CO2 32 10/07/2015   GLUCOSE 110* 10/07/2015   BUN 11 10/07/2015   CREATININE 0.77 10/07/2015   BILITOT 0.7 10/07/2015   ALKPHOS 49 10/07/2015   AST 25 10/07/2015   ALT 14 10/07/2015   PROT 6.8 10/07/2015   ALBUMIN 3.3* 10/07/2015   CALCIUM 8.6 10/07/2015   GFR 101.31 10/07/2015   No results found for: CHOL No results found for:  HDL No results found for: LDLCALC No results found for: TRIG No results found for: CHOLHDL No results found for: WUJW1X     Assessment & Plan:   Problem List Items Addressed This Visit    Neck fracture (HCC) - Primary    Patient remains at home with his wife but has been essentially wheelchair bound and can only help with transfers. He is in need of a new wheelchair at this time. He now needs an extra wide wheelchair but still only uses a manual chair will have him a new prescription for this       Hyperglycemia     minimize simple carbs. Increase exercise as tolerated.          I have discontinued Mr. Kuck ranitidine, cetirizine, mupirocin ointment, potassium chloride SA, and amoxicillin-clavulanate. I am also having him maintain his bisacodyl, cholecalciferol, oxybutynin, senna-docusate, levothyroxine, cyanocobalamin, SYRINGE 3CC/25GX1", furosemide, methadone, and gabapentin.  No orders of the defined types were placed in this encounter.     Danise Edge, MD

## 2015-11-11 ENCOUNTER — Telehealth: Payer: Self-pay | Admitting: Family Medicine

## 2015-11-11 DIAGNOSIS — S12000A Unspecified displaced fracture of first cervical vertebra, initial encounter for closed fracture: Secondary | ICD-10-CM

## 2015-11-11 DIAGNOSIS — R531 Weakness: Secondary | ICD-10-CM

## 2015-11-11 DIAGNOSIS — S12100A Unspecified displaced fracture of second cervical vertebra, initial encounter for closed fracture: Secondary | ICD-10-CM

## 2015-11-11 DIAGNOSIS — N319 Neuromuscular dysfunction of bladder, unspecified: Secondary | ICD-10-CM | POA: Diagnosis not present

## 2015-11-11 DIAGNOSIS — R296 Repeated falls: Secondary | ICD-10-CM

## 2015-11-11 NOTE — Assessment & Plan Note (Signed)
minimize simple carbs. Increase exercise as tolerated.  

## 2015-11-11 NOTE — Telephone Encounter (Signed)
-----   Message from Bradd CanaryStacey A [name], MD sent at 11/11/2015  8:05 AM EDT ----- Please write him a prescription for an extra wide manual wheelchair and send to [facility].   For cervical fracture. Weakness, high fall risk  thanks

## 2015-11-11 NOTE — Assessment & Plan Note (Signed)
Patient remains at home with his wife but has been essentially wheelchair bound and can only help with transfers. He is in need of a new wheelchair at this time. He now needs an extra wide wheelchair but still only uses a manual chair will have him a new prescription for this

## 2015-11-11 NOTE — Telephone Encounter (Signed)
-----   Message from Stacey A [name], MD sent at 11/11/2015  8:05 AM EDT ----- Please write him a prescription for an extra wide manual wheelchair and send to [facility].   For cervical fracture. Weakness, high fall risk  thanks 

## 2015-11-11 NOTE — Telephone Encounter (Signed)
[facility]ax[facility] prescr[facility] at [phone] Received confirmation,

## 2015-11-12 NOTE — Telephone Encounter (Signed)
[facility]

## 2015-11-16 ENCOUNTER — Other Ambulatory Visit: Payer: Self-pay | Admitting: *Deleted

## 2015-11-16 MED ORDER — CEPHALEXIN 500 MG PO CAPS: 500.0000 mg | ORAL_CAPSULE | Freq: Two times a day (BID) | ORAL | Status: DC

## 2015-11-16 NOTE — Patient Outreach (Addendum)
[facility]) Care Management   11/16/2015  Brandon RancherVernon Franklin [birth year] [id number]  Brandon Franklin is an 82 y.o. male  Subjective: Pt has had another hospitalization for UTI. He also has had his R ear lanced and checked for bacteria but the culture was negative. Pt reports tenderness of the ear lobe. [name] is applying mupirocin on the ear tid.  He had his suprapubic catheter changed last week. He still leaks urine through the penis occastionally which may be due to the catheter being plugged up.  Objective:   Review of Systems  Constitutional: Negative.   Eyes: Negative.   Respiratory: Negative.   Cardiovascular: Negative.   Gastrointestinal: Negative.   Genitourinary:       Catheter draining well, urine is light yellow and some mucous strands noted.  Musculoskeletal: Positive for neck pain.  Skin:       Red, swollen ear lobe (R) with red streak radiating down neck.  Neurological: Negative.   Endo/Heme/Allergies: Negative.   Psychiatric/Behavioral: Negative.    BP 104/60 mmHg  Pulse 68  Resp 16  Wt 232 lb (105.235 kg)  SpO2 97%  Physical Exam  Constitutional: He is oriented to person, place, and time. He appears well-developed and well-nourished.  HENT:  Head: Normocephalic.  Left Ear: External ear normal.  Red swollen ear lobe with red streak going down his neck.  Neck:  Cervical spondylosis.  Cardiovascular: Normal rate, regular rhythm and normal heart sounds.   Respiratory: Effort normal and breath sounds normal.  GI: Soft. Bowel sounds are normal.  Neurological: He is alert and oriented to person, place, and time.  Psychiatric: He has a normal mood and affect. His behavior is normal. Judgment and thought content normal.    Encounter Medications:   Outpatient Encounter Prescriptions as of 11/16/2015  Medication Sig Note  . bisacodyl (DULCOLAX) 5 MG EC tablet Take 5 mg by mouth daily as needed.   . cholecalciferol (VITAMIN D) 1000 UNITS tablet Take  1,000 Units by mouth daily.   . cyanocobalamin (,VITAMIN B-12,) 1000 MCG/ML injection Inject 1 mL (1,000 mcg total) into the muscle every 30 (thirty) days.   . furosemide (LASIX) 40 MG tablet Take 1 tablet (40 mg total) by mouth daily.   Marland Kitchen. gabapentin (NEURONTIN) 100 MG capsule Take 3 capsules (300 mg total) by mouth 3 (three) times daily.   Marland Kitchen. levothyroxine (SYNTHROID, LEVOTHROID) 25 MCG tablet Take 1 tablet (25 mcg total) by mouth daily before breakfast.   . methadone (DOLOPHINE) 5 MG tablet Take 0.5 tablets (2.5 mg total) by mouth every 8 (eight) hours.   Marland Kitchen. oxybutynin (DITROPAN) 5 MG tablet Take 10 mg by mouth daily.    Marland Kitchen. senna-docusate (SENOKOT-S) 8.6-50 MG per tablet Take 4 tablets by mouth 2 (two) times daily. Only takes as needed. 03/24/2015: Taking Equate currently because pharmacy did not have this   . Syringe/Needle, Disp, (SYRINGE 3CC/25GX1") 25G X 1" 3 ML MISC To use with vitamin B12   . cephALEXin (KEFLEX) 500 MG capsule Take 1 capsule (500 mg total) by mouth 2 (two) times daily.    No facility-administered encounter medications on file as of 11/16/2015.    Functional Status:   In your present state of health, do you have any difficulty performing the following activities: 05/17/2015 02/16/2015  Hearing? N N  Vision? N N  Difficulty concentrating or making decisions? N N  Walking or climbing stairs? Y Y  Dressing or bathing? Y Y  Doing errands, shopping? YJeannie Franklin  Y  Preparing Food and eating ? Y Y  Using the Toilet? Y Y  In the past six months, have you accidently leaked urine? Y Y  Do you have problems with loss of bowel control? Y Y  Managing your Medications? Y Y  Managing your Finances? Brandon Franklin  Housekeeping or managing your Housekeeping? Brandon Franklin    Fall/Depression Screening:    PHQ 2/9 Scores 05/17/2015 02/16/2015 11/06/2014  PHQ - 2 Score 1 0 2    Assessment:  Cellulitis of R ear lobe                          Cervical spondylosis  Plan:  Gave Rx for Cephalexin 500 mg one po bid for  7 days and 1 refill to be filled if infection is not cleared. Reinforced the need for good handwashing multiple times a day and to use hand sanitizer.  Mrs. Dahms to continue applying the mupirocin ointment tid. He can also use a warm compress to the ear several times a day to ease the discomfort and may take APAP for pain.  I will call next week to see how his ear is doing. Encouraged them to call me if any problems arise. Reminded them to call me at first sign anything is going wrong or to let me know if pt does have an ED visit or hospitalization. We also talked about the possibility of needed to irrigate the catheter if it is leaking to clear any mucous plugs.  Almetta Lovely [facility] Care Manager [phone]

## 2015-11-19 NOTE — Telephone Encounter (Signed)
Faxed Office visit notes to [phone]. Called the daughter to confirm message received and notes faxed. Daughter will call [facility] to confirm notes received

## 2015-11-19 NOTE — Telephone Encounter (Addendum)
Caller name: Arline AspCindy Relationship to patient: daughter Can be reached: [phone]  Reason for call:  [facility] and [facility] are [facility] 2 forms that need to be completed. One relative to DX for wheelchair and the other that needs signed for medical necessity?

## 2015-11-19 NOTE — Telephone Encounter (Addendum)
Relation to QM:VHQIOpt:Cindy Campbell StallMcPherson tel [phone]   Reason for call:  Daughter checking on the status of message below, advised daughter Rx was sent over confirmation received, Advised [facility] and [facility]. Daughter would like our office to re fax and call directly. Please advise.

## 2015-11-19 NOTE — Telephone Encounter (Signed)
Daughter call back to verify order was received and [facility] supporting why patient needs w/c. Please advise

## 2015-11-22 NOTE — Telephone Encounter (Signed)
Forms received today/given to PCP to complete.

## 2015-11-24 ENCOUNTER — Other Ambulatory Visit: Payer: Self-pay | Admitting: *Deleted

## 2015-11-24 NOTE — Patient Outreach (Signed)
Follow up phone call: Wife reports slow progress with ear infection. It is a lttle better but his ear lobe is still red. He does not have any fever. Mrs.Babineaux has not been washing the area, she has only been cleaning it with a Qtip and applying the murpirocin. They still have a few days left of the 10 day cephalexin Rx I gave him.  I have instructed Mrs. Tirone to start washing the ear lobe area with a guaze sponge, antibacterial soap and water. Throw away this gauze and use a clean gauze with warm water to rinse the area. She can then apply the mupirocin to the affected area.  Instructed to use meticulous hand washing for both of them. I want to make sure Mr.Secrist gets his hands washed, nails cleaned and that he uses the antimicobial hand wash.  I will call again next week and I have asked Mrs. Stambaugh to call me if there is worsening of the ear for prompt action.  Almetta LovelyCarroll Amairani [facility] LPGNP-BC [facility] Care Manager [phone]

## 2015-11-25 NOTE — Telephone Encounter (Signed)
Measurements are from side to side 33" From top of hip to knee 20"

## 2015-11-25 NOTE — Telephone Encounter (Signed)
Starleen BlueCindy McPherson calling back checking on the status of message below.

## 2015-11-25 NOTE — Telephone Encounter (Signed)
PCP is working on form Called the daughter to have them measure the patients width and to measure from the top of his hip to the back of his knee.  The daughter stated she would take care and call us back asap with measurements

## 2015-11-30 ENCOUNTER — Other Ambulatory Visit: Payer: Self-pay | Admitting: *Deleted

## 2015-11-30 NOTE — Telephone Encounter (Signed)
I do not believe I have this form at this time.

## 2015-11-30 NOTE — Telephone Encounter (Signed)
Once completed the from with these measurements I can fax

## 2015-11-30 NOTE — Patient Outreach (Signed)
Follow up on infected ear lobe. Mrs. Polidori reports the ear is looking better. She says the red area on the lobe is almost gone. The antibiotic (cephalexin) was finished yesterday, which was a 10 day course. Mrs. Flanary continues to clean the ear lobe with antibacterial soap and water and is applying the mupurocin ointment. I will call back on Thursday to see if it appears to be continuing to improve or not. We may have to use an additional amount of an antibiotic to totally clear this infection.  Almetta LovelyCarroll Jhania Etherington [facility]NP-BC [facility] Care Manager [phone]

## 2015-12-02 ENCOUNTER — Ambulatory Visit: Payer: Self-pay | Admitting: *Deleted

## 2015-12-02 NOTE — Telephone Encounter (Signed)
Called [facility] left a detailed message for Brandon Franklin at [phone] ext. [phone] to refax form to us

## 2015-12-02 NOTE — Telephone Encounter (Signed)
[facility] Att[facility] Orpah Clintonshley Adams at [phone] completed form for new wheel chair.

## 2015-12-03 ENCOUNTER — Other Ambulatory Visit: Payer: Self-pay | Admitting: Family Medicine

## 2015-12-03 MED ORDER — GABAPENTIN 100 MG PO CAPS: 300.0000 mg | ORAL_CAPSULE | Freq: Three times a day (TID) | ORAL | Status: DC

## 2015-12-03 NOTE — Telephone Encounter (Signed)
Refill done per PCP instructions. Patients wife informed refill done.

## 2015-12-03 NOTE — Telephone Encounter (Signed)
Can be reached: [phone] [facility] [place] [place] - [address]  Reason for call: pt needs gabapentin refill. Has 2 pills left. He takes 3 100mg  capsules 3 times per day (9 total per day).

## 2015-12-03 NOTE — Telephone Encounter (Signed)
Spouse states completely out and states he's next dose is at 2pm. Informed spouse PCP is in clinic and will follow up.

## 2015-12-06 ENCOUNTER — Other Ambulatory Visit: Payer: Self-pay | Admitting: Family Medicine

## 2015-12-06 DIAGNOSIS — E538 Deficiency of other specified B group vitamins: Secondary | ICD-10-CM

## 2015-12-06 MED ORDER — METHADONE HCL 5 MG PO TABS: 2.5000 mg | ORAL_TABLET | Freq: Three times a day (TID) | ORAL | Status: DC

## 2015-12-06 NOTE — Telephone Encounter (Signed)
Caller name: Mrs. Brandon Franklin  Relation to UJ:WJXBJYpt:spouse  Call back [phone] Pharmacy:  Reason for call:  Spouse requesting a refill methadone (DOLOPHINE) 5 MG tablet

## 2015-12-06 NOTE — Telephone Encounter (Signed)
Last refill on 10/19/2015  #45 with 0 refills Last office visit 11/09/2015 Contract 03/26/2015, no UDS

## 2015-12-07 NOTE — Telephone Encounter (Signed)
Patients wife Corinna GabFaye Sifuentes informed to pickup hardcopy at the front desk.

## 2015-12-09 DIAGNOSIS — N319 Neuromuscular dysfunction of bladder, unspecified: Secondary | ICD-10-CM | POA: Diagnosis not present

## 2016-01-05 ENCOUNTER — Other Ambulatory Visit: Payer: Self-pay | Admitting: Family Medicine

## 2016-01-05 MED ORDER — LEVOTHYROXINE SODIUM 25 MCG PO TABS: 25.0000 ug | ORAL_TABLET | Freq: Every day | ORAL | Status: DC

## 2016-01-05 NOTE — Telephone Encounter (Signed)
Rx sent 

## 2016-01-05 NOTE — Telephone Encounter (Signed)
Relation to ZH:YQMVpt:self Call back [phone] [facility] [place], [place] - [address] [phone] (Phone) [phone])         Reason for call:  Patient requesting a refill levothyroxine (SYNTHROID, LEVOTHROID) 25 MCG tablet

## 2016-01-07 DIAGNOSIS — N319 Neuromuscular dysfunction of bladder, unspecified: Secondary | ICD-10-CM | POA: Diagnosis not present

## 2016-01-10 ENCOUNTER — Other Ambulatory Visit: Payer: Self-pay | Admitting: Family Medicine

## 2016-01-10 DIAGNOSIS — E538 Deficiency of other specified B group vitamins: Secondary | ICD-10-CM

## 2016-01-10 MED ORDER — METHADONE HCL 5 MG PO TABS: 2.5000 mg | ORAL_TABLET | Freq: Three times a day (TID) | ORAL | Status: DC

## 2016-02-11 ENCOUNTER — Telehealth: Payer: Self-pay | Admitting: Family Medicine

## 2016-02-11 DIAGNOSIS — E538 Deficiency of other specified B group vitamins: Secondary | ICD-10-CM

## 2016-02-11 MED ORDER — METHADONE HCL 5 MG PO TABS: 2.5000 mg | ORAL_TABLET | Freq: Three times a day (TID) | ORAL | 0 refills | Status: DC

## 2016-02-11 NOTE — Telephone Encounter (Signed)
Caller name: Lucendia HerrlichFaye Relationship to patient: Wife Can be reached: [phone]    Reason for call: Request refill on methadone (DOLOPHINE) 5 MG tablet [[id number]

## 2016-02-11 NOTE — Telephone Encounter (Signed)
OK to refill his Metadone as prescribed.

## 2016-02-11 NOTE — Telephone Encounter (Signed)
Last refill on 01/10/2016  #45 with 0 refills Last office visit on 11/09/2015 Contract done on 03/26/2015

## 2016-02-11 NOTE — Telephone Encounter (Signed)
Printed and wife informed to pickup at the front desk.

## 2016-02-14 DIAGNOSIS — R8271 Bacteriuria: Secondary | ICD-10-CM | POA: Diagnosis not present

## 2016-02-14 DIAGNOSIS — N319 Neuromuscular dysfunction of bladder, unspecified: Secondary | ICD-10-CM | POA: Diagnosis not present

## 2016-02-15 ENCOUNTER — Other Ambulatory Visit: Payer: Self-pay | Admitting: *Deleted

## 2016-02-15 ENCOUNTER — Encounter: Payer: Self-pay | Admitting: *Deleted

## 2016-02-15 NOTE — Patient Outreach (Signed)
[facility]) Care Management   02/15/2016  Brandon Franklin 11-30-1925 [id number]  Brandon Franklin is an 82 y.o. male  Subjective: Pt is doing fairly well. His R ear lobe is healed. Now having a skin problem on his scrotum. He still has urine which leaks from his penis despite having the suprapubic catheter. M6rs. Pulice reports his scrotum is red and raw and the skin just pulls off when his underpad or Depends is removed. Other wise, his pain is controlled. His weight is stable, no SOB, no edema. He goes to Dr. Willaim Bane office to have his catheter changed every 4-6 weeks. A friend is able to administer his monthly B12 injection. He will be seeing Dr. Randel Pigg in September.  Objective:   Review of Systems  Constitutional: Negative.   HENT: Negative.   Eyes: Negative.   Respiratory: Negative.   Cardiovascular: Negative.   Gastrointestinal: Negative.   Genitourinary:       Suprapubic cath in place.  Musculoskeletal: Negative.   Skin: Positive for rash.  Neurological: Negative.   Endo/Heme/Allergies: Negative.   Psychiatric/Behavioral: Negative.    BP (!) 90/56 (BP Location: Right Arm, Patient Position: Sitting, Cuff Size: Large)   Pulse 72   Resp 16   Wt 243 lb (110.2 kg)   SpO2 96%   BMI 36.95 kg/m   Physical Exam  Constitutional: He is oriented to person, place, and time. He appears well-developed and well-nourished.  HENT:  Head: Normocephalic.  Neck:  Stiff and very limited ROM.  Cardiovascular: Normal rate and regular rhythm.   Respiratory: Effort normal and breath sounds normal.  GI: Soft. Bowel sounds are normal.  Suprapubic cath in place.  Musculoskeletal:  Limited ROM.  Neurological: He is alert and oriented to person, place, and time.  Skin: Rash noted. There is erythema.  Pt is very gaulded on his testicles from urine which leaks from his penis despite having the suprapubic catheter.  Psychiatric: He has a normal mood and affect.    Encounter  Medications:   Outpatient Encounter Prescriptions as of 02/15/2016  Medication Sig Note  . bisacodyl (DULCOLAX) 5 MG EC tablet Take 5 mg by mouth daily as needed.   . cholecalciferol (VITAMIN D) 1000 UNITS tablet Take 1,000 Units by mouth daily.   . cyanocobalamin (,VITAMIN B-12,) 1000 MCG/ML injection Inject 1 mL (1,000 mcg total) into the muscle every 30 (thirty) days.   . furosemide (LASIX) 40 MG tablet Take 1 tablet (40 mg total) by mouth daily.   Marland Kitchen gabapentin (NEURONTIN) 100 MG capsule Take 3 capsules (300 mg total) by mouth 3 (three) times daily.   Marland Kitchen levothyroxine (SYNTHROID, LEVOTHROID) 25 MCG tablet Take 1 tablet (25 mcg total) by mouth daily before breakfast.   . methadone (DOLOPHINE) 5 MG tablet Take 0.5 tablets (2.5 mg total) by mouth every 8 (eight) hours. 02/15/2016: Only takes this bid.  Marland Kitchen oxybutynin (DITROPAN) 5 MG tablet Take 10 mg by mouth daily.    Marland Kitchen senna-docusate (SENOKOT-S) 8.6-50 MG per tablet Take 4 tablets by mouth 2 (two) times daily. Only takes as needed. 03/24/2015: Taking Equate currently because pharmacy did not have this   . Syringe/Needle, Disp, (SYRINGE 3CC/25GX1") 25G X 1" 3 ML MISC To use with vitamin B12   . [DISCONTINUED] cephALEXin (KEFLEX) 500 MG capsule Take 1 capsule (500 mg total) by mouth 2 (two) times daily. (Patient not taking: Reported on 02/15/2016)    No facility-administered encounter medications on file as of 02/15/2016.  Functional Status:   In your present state of health, do you have any difficulty performing the following activities: 05/17/2015 02/16/2015  Hearing? N N  Vision? N N  Difficulty concentrating or making decisions? N N  Walking or climbing stairs? Y Y  Dressing or bathing? Y Y  Doing errands, shopping? Tempie Donning  Preparing Food and eating ? Y Y  Using the Toilet? Y Y  In the past six months, have you accidently leaked urine? Y Y  Do you have problems with loss of bowel control? Y Y  Managing your Medications? Y Y  Managing your  Finances? Tempie Donning  Housekeeping or managing your Housekeeping? Tempie Donning  Some recent data might be hidden    Fall/Depression Screening:    PHQ 2/9 Scores 05/17/2015 02/16/2015 11/06/2014  PHQ - 2 Score 1 0 2    Assessment:  Cervical spondylosis                         Skin Impairment - scrotum  Plan:  Advised to continue washing R ear along with daily sponge bath.            Discussed ways to care for his scrotal skin impairment: Start doing a sitz bath with lotion in warm water once a day or twice if he likes this. Pat skin dry after 10 minutes. The lie in bed and allow fan to blow on the skin for 10 minutes, then apply vitamin A & D.   [facility] CM Care Plan Problem One   Flowsheet Row Most Recent Value  Care Plan Problem One  (P) Difficulty recognizing problematic health issues.  Role Documenting the Problem One  (P) Care Management [place] for Problem One  (P) Active  [facility] Long Term Goal (31-90 days)  (P) Wife to call me at the first sign of any abnormal symptom for quick intervention over the next 3 months.  [facility] Long Term Goal Start Date  (P) 11/24/15  Interventions for Problem One Long Term Goal  (P) Asked Mrs. Everetts to nortify me if pt has a change in his condition immediately so that I may intervene to avoid a hospitalization. Also asked her if pt does go to hospital to let me know he is there so I can see him on his returrn home.    [facility] CM Care Plan Problem Two   Flowsheet Row Most Recent Value  [facility] Long Term Goal (31-90) days  (P) Pt will not fall in the next 90 days.  [facility] Long Term Goal Start Date  (P) 05/17/15  [facility] Long Term Goal Met Date  (P) 08/17/15    [facility] CM Care Plan Problem Three   Flowsheet Row Most Recent Value  Care Plan Problem Three  (P) Cellulits of the R ear.  Role Documenting the Problem Three  (P) Care Management [place] for Problem Three  (P) Active  [facility] CM Short Term Goal #1 (0-30 days)  (P) Wife will do precribed wound treatment daily over  the next week.  [facility] CM Short Term Goal #1 Start Date  (P) 11/24/15  Interventions for Short Term Goal #1  (P) Gave instructions for hand washing, wound care to do daily. Will re-evaluate in one week.     Deloria Lair [place] [phone]

## 2016-02-17 ENCOUNTER — Encounter: Payer: Self-pay | Admitting: *Deleted

## 2016-02-24 ENCOUNTER — Other Ambulatory Visit: Payer: Self-pay | Admitting: *Deleted

## 2016-02-24 NOTE — Patient Outreach (Signed)
Called for follow up on pt's skin impairment on his scrotum. Mrs. Savage reports the sitz baths, drying gently and apply vitamin A & D and worked well and his skin is nearly healed. He reports he is much more comfortable and is steeping better too.  I made my last home visit to pt and closed his case. Mrs. Dougan knows that I am available if they have questions or need me again in the future.  Brandon Franklin [name] Reach [facility]NP-BC [facility] Care Manager [phone]

## 2016-03-13 ENCOUNTER — Other Ambulatory Visit: Payer: Self-pay | Admitting: Family Medicine

## 2016-03-13 MED ORDER — LEVOTHYROXINE SODIUM 25 MCG PO TABS: 25.0000 ug | ORAL_TABLET | Freq: Every day | ORAL | 3 refills | Status: DC

## 2016-03-13 MED ORDER — GABAPENTIN 100 MG PO CAPS: 300.0000 mg | ORAL_CAPSULE | Freq: Three times a day (TID) | ORAL | 2 refills | Status: DC

## 2016-03-13 NOTE — Telephone Encounter (Signed)
Caller name: Lucendia HerrlichFaye Relationship to patient: Wife Can be reached: [phone]  [facility] [place] [id number] [phone])     Reason for call: Request refills on levothyroxine (SYNTHROID, LEVOTHROID) 25 MCG tablet [[id number]][[id number]]   gabapentin (NEURONTIN) 100 MG capsule [[id number]

## 2016-03-16 DIAGNOSIS — N319 Neuromuscular dysfunction of bladder, unspecified: Secondary | ICD-10-CM | POA: Diagnosis not present

## 2016-03-16 DIAGNOSIS — R8271 Bacteriuria: Secondary | ICD-10-CM | POA: Diagnosis not present

## 2016-03-23 ENCOUNTER — Ambulatory Visit (INDEPENDENT_AMBULATORY_CARE_PROVIDER_SITE_OTHER): Payer: Medicare Other | Admitting: Family Medicine

## 2016-03-23 ENCOUNTER — Encounter: Payer: Self-pay | Admitting: Family Medicine

## 2016-03-23 VITALS — BP 110/62 | HR 69 | Temp 97.8°F | Ht 71.0 in | Wt 243.0 lb

## 2016-03-23 DIAGNOSIS — I509 Heart failure, unspecified: Secondary | ICD-10-CM

## 2016-03-23 DIAGNOSIS — E669 Obesity, unspecified: Secondary | ICD-10-CM

## 2016-03-23 DIAGNOSIS — Z23 Encounter for immunization: Secondary | ICD-10-CM | POA: Diagnosis not present

## 2016-03-23 DIAGNOSIS — E538 Deficiency of other specified B group vitamins: Secondary | ICD-10-CM

## 2016-03-23 DIAGNOSIS — R739 Hyperglycemia, unspecified: Secondary | ICD-10-CM | POA: Diagnosis not present

## 2016-03-23 DIAGNOSIS — S12101S Unspecified nondisplaced fracture of second cervical vertebra, sequela: Secondary | ICD-10-CM

## 2016-03-23 MED ORDER — METHADONE HCL 5 MG PO TABS: 2.5000 mg | ORAL_TABLET | Freq: Three times a day (TID) | ORAL | 0 refills | Status: DC | PRN

## 2016-03-23 NOTE — Progress Notes (Signed)
Pre visit review using our clinic review tool, if applicable. No additional management support is needed unless otherwise documented below in the visit note. 

## 2016-03-23 NOTE — Patient Instructions (Signed)

## 2016-03-24 LAB — CBC
HEMATOCRIT: 35.1 % — AB (ref 39.0–52.0)
HEMOGLOBIN: 11.9 g/dL — AB (ref 13.0–17.0)
MCHC: 34 g/dL (ref 30.0–36.0)
MCV: 88.2 fl (ref 78.0–100.0)
PLATELETS: 159 10*3/uL (ref 150.0–400.0)
RBC: 3.98 Mil/uL — AB (ref 4.22–5.81)
RDW: 14.5 % (ref 11.5–15.5)
WBC: 5.2 10*3/uL (ref 4.0–10.5)

## 2016-03-24 LAB — COMPREHENSIVE METABOLIC PANEL
ALBUMIN: 3.4 g/dL — AB (ref 3.5–5.2)
ALK PHOS: 46 U/L (ref 39–117)
ALT: 7 U/L (ref 0–53)
AST: 7 U/L (ref 0–37)
BUN: 19 mg/dL (ref 6–23)
CALCIUM: 8.7 mg/dL (ref 8.4–10.5)
CO2: 31 mEq/L (ref 19–32)
Chloride: 101 mEq/L (ref 96–112)
Creatinine, Ser: 0.92 mg/dL (ref 0.40–1.50)
GFR: 82.42 mL/min (ref >60.00)
Glucose, Bld: 118 mg/dL — ABNORMAL HIGH (ref 70–99)
POTASSIUM: 4.1 meq/L (ref 3.5–5.1)
Sodium: 137 mEq/L (ref 135–145)
TOTAL PROTEIN: 6.9 g/dL (ref 6.0–8.3)
Total Bilirubin: 0.4 mg/dL (ref 0.2–1.2)

## 2016-03-24 LAB — VITAMIN B12: VITAMIN B 12: 647 pg/mL (ref 211–911)

## 2016-03-24 LAB — HEMOGLOBIN A1C: Hgb A1c MFr Bld: 5.5 % (ref 4.6–6.5)

## 2016-03-24 LAB — TSH: TSH: 2.78 u[IU]/mL (ref 0.35–4.50)

## 2016-04-01 NOTE — Assessment & Plan Note (Signed)
Encouraged DASH diet, decrease po intake and increase activity as tolerated.

## 2016-04-01 NOTE — Assessment & Plan Note (Signed)
minimize simple carbs. Increase exercise as tolerated.  

## 2016-04-01 NOTE — Assessment & Plan Note (Signed)
Taking low dose of Methadone. May continue for now and encouraged to minimize dosing.

## 2016-04-01 NOTE — Progress Notes (Signed)
Patient ID: Brandon Franklin, male   DOB: 1926/03/24, 82 y.o.   MRN: [id number]   Subjective:    Patient ID: Brandon Franklin, male    DOB: 1926/03/24, 82 y.o.   MRN: [id number]  Chief Complaint  Patient presents with  . Follow-up    HPI Patient is in today for follow up accompanied by his wife. He is doing well at home. No recent illness or acute concerns. No recent hospitalization. No CHF exacerbation although he does struggle with deconditioning and SOB with exertion as well as pedal edema with his immobility. Denies CP/palp/HA/congestion/fevers/GI or GU c/o. Taking meds as prescribed  Past Medical History:  Diagnosis Date  . 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 Allergy    seasonal  . Arthritis   . Cellulitis 09/20/2015  . CHF (congestive heart failure) (HCC)   . Depression   . Hyperglycemia 05/09/2015  . Low back pain 04/04/2015  . Neck fracture (HCC)   . Neuromuscular disorder (HCC)   . Obesity 04/04/2015  . Urinary anomaly   . Vitamin B12 deficiency 04/04/2015    Past Surgical History:  Procedure Laterality Date  . INSERTION OF SUPRAPUBIC CATHETER    . SURGERY SCROTAL / TESTICULAR Right     Family History  Problem Relation Age of Onset  . Cancer Sister     Social History   Social History  . Marital status: Married    Spouse name: N/A  . Number of children: N/A  . Years of education: N/A   Occupational History  . retired    Social History Main Topics  . Smoking status: Never Smoker  . Smokeless tobacco: Never Used  . Alcohol use No  . Drug use: No  . Sexual activity: No   Other Topics Concern  . Not on file   Social History Narrative  . No narrative on file    Outpatient Medications Prior to Visit  Medication Sig Dispense Refill  . bisacodyl (DULCOLAX) 5 MG EC tablet Take 5 mg by mouth daily as needed.    . cholecalciferol (VITAMIN D) 1000 UNITS tablet Take 1,000 Units by mouth daily.    . cyanocobalamin (,VITAMIN B-12,) 1000 MCG/ML injection Inject 1 mL (1,000  mcg total) into the muscle every 30 (thirty) days. 10 mL 1  . furosemide (LASIX) 40 MG tablet Take 1 tablet (40 mg total) by mouth daily. 30 tablet 5  . gabapentin (NEURONTIN) 100 MG capsule Take 3 capsules (300 mg total) by mouth 3 (three) times daily. 270 capsule 2  . gabapentin (NEURONTIN) 100 MG capsule TAKE THREE CAPSULES BY MOUTH THREE TIMES DAILY 270 capsule 2  . levothyroxine (SYNTHROID, LEVOTHROID) 25 MCG tablet Take 1 tablet (25 mcg total) by mouth daily before breakfast. 30 tablet 3  . oxybutynin (DITROPAN) 5 MG tablet Take 10 mg by mouth daily.     Marland Kitchen. senna-docusate (SENOKOT-S) 8.6-50 MG per tablet Take 4 tablets by mouth 2 (two) times daily. Only takes as needed.    . Syringe/Needle, Disp, (SYRINGE 3CC/25GX1") 25G X 1" 3 ML MISC To use with vitamin B12 50 each 1  . methadone (DOLOPHINE) 5 MG tablet Take 0.5 tablets (2.5 mg total) by mouth every 8 (eight) hours. 45 tablet 0   No facility-administered medications prior to visit.     Allergies  Allergen Reactions  . Ciprofloxacin   . Levaquin [Levofloxacin In D5w] Other (See Comments)  . Doxycycline Hyclate Rash    Review of Systems  Constitutional: Positive for malaise/fatigue. Negative for  fever.  HENT: Negative for congestion.   Eyes: Negative for blurred vision.  Respiratory: Positive for shortness of breath.   Cardiovascular: Positive for leg swelling. Negative for chest pain and palpitations.  Gastrointestinal: Negative for abdominal pain, blood in stool and nausea.  Genitourinary: Negative for dysuria and frequency.  Musculoskeletal: Positive for neck pain. Negative for falls.  Skin: Negative for rash.  Neurological: Negative for dizziness, loss of consciousness and headaches.  Endo/Heme/Allergies: Negative for environmental allergies.  Psychiatric/Behavioral: Negative for depression. The patient is not nervous/anxious.        Objective:    Physical Exam  Constitutional: He is oriented to person, place, and  time. He appears well-developed and well-nourished. No distress.  HENT:  Head: Normocephalic and atraumatic.  Nose: Nose normal.  Eyes: Right eye exhibits no discharge. Left eye exhibits no discharge.  Neck: Normal range of motion. Neck supple.  Cardiovascular: Normal rate and regular rhythm.   Pulmonary/Chest: Effort normal and breath sounds normal.  Abdominal: Soft. Bowel sounds are normal. There is no tenderness.  Musculoskeletal: He exhibits edema.  Neurological: He is alert and oriented to person, place, and time.  Skin: Skin is warm and dry.  Psychiatric: He has a normal mood and affect.  Nursing note and vitals reviewed.   BP 110/62 (BP Location: Left Arm, Patient Position: Sitting, Cuff Size: Large)   Pulse 69   Temp 97.8 F (36.6 C) (Oral)   Ht 5\' 11"  (1.803 m)   Wt 243 lb (110.2 kg)   BMI 33.89 kg/m  Wt Readings from Last 3 Encounters:  03/23/16 243 lb (110.2 kg)  02/15/16 243 lb (110.2 kg)  11/16/15 232 lb (105.2 kg)     Lab Results  Component Value Date   WBC 5.2 03/23/2016   HGB 11.9 (L) 03/23/2016   HCT 35.1 (L) 03/23/2016   PLT 159.0 03/23/2016   GLUCOSE 118 (H) 03/23/2016   ALT 7 03/23/2016   AST 7 03/23/2016   NA 137 03/23/2016   K 4.1 03/23/2016   CL 101 03/23/2016   CREATININE 0.92 03/23/2016   BUN 19 03/23/2016   CO2 31 03/23/2016   TSH 2.78 03/23/2016   INR 1.1 05/17/2008   HGBA1C 5.5 03/23/2016    Lab Results  Component Value Date   TSH 2.78 03/23/2016   Lab Results  Component Value Date   WBC 5.2 03/23/2016   HGB 11.9 (L) 03/23/2016   HCT 35.1 (L) 03/23/2016   MCV 88.2 03/23/2016   PLT 159.0 03/23/2016   Lab Results  Component Value Date   NA 137 03/23/2016   K 4.1 03/23/2016   CO2 31 03/23/2016   GLUCOSE 118 (H) 03/23/2016   BUN 19 03/23/2016   CREATININE 0.92 03/23/2016   BILITOT 0.4 03/23/2016   ALKPHOS 46 03/23/2016   AST 7 03/23/2016   ALT 7 03/23/2016   PROT 6.9 03/23/2016   ALBUMIN 3.4 (L) 03/23/2016   CALCIUM  8.7 03/23/2016   GFR 82.42 03/23/2016   No results found for: CHOL No results found for: HDL No results found for: LDLCALC No results found for: TRIG No results found for: CHOLHDL Lab Results  Component Value Date   HGBA1C 5.5 03/23/2016       Assessment & Plan:   Problem List Items Addressed This Visit    CHF (congestive heart failure) (HCC)    No recent exacerbation. Encouraged to minimize sodium intake and elevate feet as possible      Relevant Orders  TSH (Completed)   CBC (Completed)   Neck fracture (HCC)    Taking low dose of Methadone. May continue for now and encouraged to minimize dosing.      Obesity    Encouraged DASH diet, decrease po intake and increase activity as tolerated.       Vitamin B12 deficiency   Relevant Orders   CBC (Completed)   Hyperglycemia - Primary    minimize simple carbs. Increase exercise as tolerated.       Relevant Orders   Hemoglobin A1c (Completed)   Comprehensive metabolic panel (Completed)    Other Visit Diagnoses    Encounter for immunization       Relevant Medications   methadone (DOLOPHINE) 5 MG tablet   Other Relevant Orders   Flu vaccine HIGH DOSE PF (Completed)   Vitamin B 12 deficiency       Relevant Medications   methadone (DOLOPHINE) 5 MG tablet   Other Relevant Orders   Vitamin B12 (Completed)      I have changed Mr. Laymon methadone. I am also having him maintain his bisacodyl, cholecalciferol, oxybutynin, senna-docusate, cyanocobalamin, SYRINGE 3CC/25GX1", furosemide, gabapentin, levothyroxine, and gabapentin.  Meds ordered this encounter  Medications  . methadone (DOLOPHINE) 5 MG tablet    Sig: Take 0.5 tablets (2.5 mg total) by mouth 3 (three) times daily as needed.    Dispense:  45 tablet    Refill:  0     Danise Edge, MD

## 2016-04-01 NOTE — Assessment & Plan Note (Signed)
No recent exacerbation. Encouraged to minimize sodium intake and elevate feet as possible

## 2016-04-13 DIAGNOSIS — N319 Neuromuscular dysfunction of bladder, unspecified: Secondary | ICD-10-CM | POA: Diagnosis not present

## 2016-04-18 ENCOUNTER — Telehealth: Payer: Self-pay | Admitting: Family Medicine

## 2016-04-18 NOTE — Telephone Encounter (Signed)
Caller name: Lucendia HerrlichFaye Relationship to patient: Wife  Can be reached: [phone]    Reason for call: Request refill on methadone (DOLOPHINE) 5 MG tablet [[id number]][[id number]]

## 2016-04-20 ENCOUNTER — Other Ambulatory Visit: Payer: Self-pay

## 2016-04-20 ENCOUNTER — Telehealth: Payer: Self-pay | Admitting: Family Medicine

## 2016-04-20 DIAGNOSIS — E538 Deficiency of other specified B group vitamins: Secondary | ICD-10-CM

## 2016-04-20 DIAGNOSIS — F411 Generalized anxiety disorder: Secondary | ICD-10-CM

## 2016-04-20 MED ORDER — METHADONE HCL 5 MG PO TABS: 2.5000 mg | ORAL_TABLET | Freq: Three times a day (TID) | ORAL | 0 refills | Status: DC | PRN

## 2016-04-20 MED ORDER — FUROSEMIDE 40 MG PO TABS: 40.0000 mg | ORAL_TABLET | Freq: Every day | ORAL | 5 refills | Status: DC

## 2016-04-20 NOTE — Telephone Encounter (Signed)
Caller name: Lucendia HerrlichFaye  Relation to pt: spouse Call back number: [phone] Pharmacy:[facility] - [place], [place] - [address]  Reason for call: Pt's spouse stated pt is needing refill on furosemide (LASIX) 40 MG tablet. Please advise.

## 2016-04-20 NOTE — Telephone Encounter (Signed)
Methadone 5MG   Last seen 03/23/16 Last filled #45-0 rf 03/23/16 Please advise PC

## 2016-04-20 NOTE — Telephone Encounter (Signed)
OK to refill Methadone

## 2016-04-20 NOTE — Telephone Encounter (Signed)
meds have been written

## 2016-05-18 DIAGNOSIS — N319 Neuromuscular dysfunction of bladder, unspecified: Secondary | ICD-10-CM | POA: Diagnosis not present

## 2016-05-29 ENCOUNTER — Other Ambulatory Visit: Payer: Self-pay | Admitting: Family Medicine

## 2016-05-29 DIAGNOSIS — F411 Generalized anxiety disorder: Secondary | ICD-10-CM

## 2016-05-29 DIAGNOSIS — E538 Deficiency of other specified B group vitamins: Secondary | ICD-10-CM

## 2016-05-29 MED ORDER — METHADONE HCL 5 MG PO TABS: 2.5000 mg | ORAL_TABLET | Freq: Three times a day (TID) | ORAL | 0 refills | Status: DC | PRN

## 2016-05-29 NOTE — Telephone Encounter (Signed)
Requesting:   methadone Contract     03/26/2015 UDS    None Last OV      03/23/2016 Last Refill    #45 no refills on 04/20/2016  Please Advise

## 2016-05-29 NOTE — Telephone Encounter (Signed)
Caller name:Mrs. Majano Relation to WU:JWJXBJpt:spouse Call back number:[phone] Pharmacy:  Reason for call: pt spouse states pt is needing rx for methadone (DOLOPHINE) 5 MG tablet, please call when available for pick up

## 2016-05-30 NOTE — Telephone Encounter (Signed)
Called the patients wife informed hardcopy is ready for pickup at the front desk.

## 2016-05-31 ENCOUNTER — Other Ambulatory Visit: Payer: Self-pay | Admitting: Family Medicine

## 2016-05-31 NOTE — Telephone Encounter (Signed)
Refill request for Methadone 5 mg Last filled by MD on - 05/29/16, #45x0 Rx has been p/u and signed for by spouse, Lucendia HerrlichFaye, 05/31/16

## 2016-05-31 NOTE — Telephone Encounter (Signed)
Caller name:Printy,[name] Relation to ZO:XWRUEApt:spouse  Call back number:[phone]   Reason for call:  spouse requesting a refill methadone (DOLOPHINE) 5 MG tablet

## 2016-06-05 ENCOUNTER — Other Ambulatory Visit: Payer: Self-pay | Admitting: Family Medicine

## 2016-06-05 MED ORDER — LEVOTHYROXINE SODIUM 25 MCG PO TABS: 25.0000 ug | ORAL_TABLET | Freq: Every day | ORAL | 6 refills | Status: DC

## 2016-06-05 MED FILL — LEVOTHYROXINE 25 MCG TABLET: 25 | 30 days supply | Qty: 30 | Fill #0

## 2016-06-05 NOTE — Telephone Encounter (Signed)
Can send it--But pharmacy wants to know if ok to change brands from [employer] to [employer].

## 2016-06-15 DIAGNOSIS — R32 Unspecified urinary incontinence: Secondary | ICD-10-CM | POA: Diagnosis not present

## 2016-07-13 ENCOUNTER — Other Ambulatory Visit: Payer: Self-pay | Admitting: Family Medicine

## 2016-07-13 DIAGNOSIS — F411 Generalized anxiety disorder: Secondary | ICD-10-CM

## 2016-07-13 DIAGNOSIS — E538 Deficiency of other specified B group vitamins: Secondary | ICD-10-CM

## 2016-07-13 DIAGNOSIS — N319 Neuromuscular dysfunction of bladder, unspecified: Secondary | ICD-10-CM | POA: Diagnosis not present

## 2016-07-13 MED ORDER — GABAPENTIN 100 MG PO CAPS: 300.0000 mg | ORAL_CAPSULE | Freq: Three times a day (TID) | ORAL | 2 refills | Status: DC

## 2016-07-13 MED FILL — GABAPENTIN 100 MG CAPSULE: 100 | 30 days supply | Qty: 270 | Fill #0 | Status: TO

## 2016-07-13 NOTE — Telephone Encounter (Signed)
Caller name: Lucendia HerrlichFaye Relationship to patient: Wife Can be reached: [phone]  Pharmacy:  Reason for call: Refill on methadone (DOLOPHINE) 5 MG tablet   gabapentin (NEURONTIN) 100 MG capsule

## 2016-07-13 NOTE — Telephone Encounter (Signed)
Last seen 03/23/16  Last filled 05/29/16  Sig: take .5 tablets po tid D prn  Please advise  PC

## 2016-07-13 NOTE — Telephone Encounter (Signed)
I think patient is taking Gabapentin 3x100 mg tabs po tid? Is that correct? OK to refill that and Tramadol. Anything different let me know

## 2016-07-17 MED ORDER — METHADONE HCL 5 MG PO TABS
2.5000 mg | ORAL_TABLET | Freq: Three times a day (TID) | ORAL | 0 refills | Status: DC | PRN
Start: 2016-07-17 — End: 2016-08-14

## 2016-07-17 MED FILL — LEVOTHYROXINE 25 MCG TABLET: 25 | 30 days supply | Qty: 30 | Fill #1

## 2016-07-17 NOTE — Telephone Encounter (Signed)
Called Dr. Abner GreenspanBlyth-  I will refill this once for him with her approval  Meds ordered this encounter  Medications  . methadone (DOLOPHINE) 5 MG tablet    Sig: Take 0.5 tablets (2.5 mg total) by mouth 3 (three) times daily as needed.    Dispense:  45 tablet    Refill:  0  . gabapentin (NEURONTIN) 100 MG capsule    Sig: Take 3 capsules (300 mg total) by mouth 3 (three) times daily.    Dispense:  270 capsule    Refill:  2

## 2016-07-17 NOTE — Telephone Encounter (Signed)
This request was originally requested on 07/13/2016-request was to be for gabapentin and Methadone (not tramadol) Patients last refill was on 05/29/2016 Last office visit 03/23/2016 Contract signed on 03/26/2015 #45 with 0 refills. Patients wife came in this am stating he has only one methadone left

## 2016-07-31 DIAGNOSIS — I959 Hypotension, unspecified: Secondary | ICD-10-CM | POA: Diagnosis not present

## 2016-07-31 DIAGNOSIS — A419 Sepsis, unspecified organism: Secondary | ICD-10-CM | POA: Diagnosis not present

## 2016-07-31 DIAGNOSIS — G894 Chronic pain syndrome: Secondary | ICD-10-CM | POA: Diagnosis not present

## 2016-07-31 DIAGNOSIS — T83518A Infection and inflammatory reaction due to other urinary catheter, initial encounter: Secondary | ICD-10-CM | POA: Diagnosis not present

## 2016-07-31 DIAGNOSIS — R8279 Other abnormal findings on microbiological examination of urine: Secondary | ICD-10-CM | POA: Diagnosis present

## 2016-07-31 DIAGNOSIS — D696 Thrombocytopenia, unspecified: Secondary | ICD-10-CM | POA: Diagnosis not present

## 2016-07-31 DIAGNOSIS — R531 Weakness: Secondary | ICD-10-CM | POA: Diagnosis not present

## 2016-07-31 DIAGNOSIS — N319 Neuromuscular dysfunction of bladder, unspecified: Secondary | ICD-10-CM | POA: Diagnosis present

## 2016-07-31 DIAGNOSIS — R0902 Hypoxemia: Secondary | ICD-10-CM | POA: Diagnosis present

## 2016-07-31 DIAGNOSIS — D649 Anemia, unspecified: Secondary | ICD-10-CM | POA: Diagnosis present

## 2016-07-31 DIAGNOSIS — R32 Unspecified urinary incontinence: Secondary | ICD-10-CM | POA: Diagnosis present

## 2016-07-31 DIAGNOSIS — R410 Disorientation, unspecified: Secondary | ICD-10-CM | POA: Diagnosis not present

## 2016-07-31 DIAGNOSIS — R600 Localized edema: Secondary | ICD-10-CM | POA: Diagnosis not present

## 2016-07-31 DIAGNOSIS — R509 Fever, unspecified: Secondary | ICD-10-CM | POA: Diagnosis not present

## 2016-07-31 DIAGNOSIS — N390 Urinary tract infection, site not specified: Secondary | ICD-10-CM | POA: Diagnosis present

## 2016-07-31 DIAGNOSIS — Z993 Dependence on wheelchair: Secondary | ICD-10-CM | POA: Diagnosis not present

## 2016-07-31 DIAGNOSIS — E876 Hypokalemia: Secondary | ICD-10-CM | POA: Diagnosis not present

## 2016-07-31 DIAGNOSIS — E039 Hypothyroidism, unspecified: Secondary | ICD-10-CM | POA: Diagnosis not present

## 2016-08-01 ENCOUNTER — Telehealth: Payer: Self-pay | Admitting: Family Medicine

## 2016-08-01 NOTE — Telephone Encounter (Signed)
[employer] fax states that Gabapentin is on their formulary, but is subject to quantity limit which permits  #180 capsules every 30 days.  Advise

## 2016-08-01 NOTE — Telephone Encounter (Signed)
He has gabapentin 100 mg caps and he takes 3 tid tell him insurance is requiring him to change to the 300 mg caps and take 1 daily they will not pay for that many a month. D/c the 100 mg cap and start the 300, disp #90 with 2 rf

## 2016-08-02 NOTE — Telephone Encounter (Signed)
Called the patients wife informed of PCP instructions on Gabapentin. The wife agreed to this change. She did prefer us not to send in to the pharmacy for now as patient has [facility] since Monday so he does not need any yet.  She will call back later when refill is needed.

## 2016-08-06 DIAGNOSIS — N319 Neuromuscular dysfunction of bladder, unspecified: Secondary | ICD-10-CM | POA: Diagnosis not present

## 2016-08-06 DIAGNOSIS — A419 Sepsis, unspecified organism: Secondary | ICD-10-CM | POA: Diagnosis not present

## 2016-08-06 DIAGNOSIS — Z9350 Unspecified cystostomy status: Secondary | ICD-10-CM | POA: Diagnosis not present

## 2016-08-06 DIAGNOSIS — R339 Retention of urine, unspecified: Secondary | ICD-10-CM | POA: Diagnosis not present

## 2016-08-06 DIAGNOSIS — N390 Urinary tract infection, site not specified: Secondary | ICD-10-CM | POA: Diagnosis not present

## 2016-08-06 DIAGNOSIS — G894 Chronic pain syndrome: Secondary | ICD-10-CM | POA: Diagnosis not present

## 2016-08-06 DIAGNOSIS — E039 Hypothyroidism, unspecified: Secondary | ICD-10-CM | POA: Diagnosis not present

## 2016-08-07 ENCOUNTER — Telehealth: Payer: Self-pay | Admitting: Family Medicine

## 2016-08-07 DIAGNOSIS — R339 Retention of urine, unspecified: Secondary | ICD-10-CM | POA: Diagnosis not present

## 2016-08-07 DIAGNOSIS — N319 Neuromuscular dysfunction of bladder, unspecified: Secondary | ICD-10-CM | POA: Diagnosis not present

## 2016-08-07 DIAGNOSIS — A419 Sepsis, unspecified organism: Secondary | ICD-10-CM | POA: Diagnosis not present

## 2016-08-07 DIAGNOSIS — G894 Chronic pain syndrome: Secondary | ICD-10-CM | POA: Diagnosis not present

## 2016-08-07 DIAGNOSIS — E039 Hypothyroidism, unspecified: Secondary | ICD-10-CM | POA: Diagnosis not present

## 2016-08-07 DIAGNOSIS — N390 Urinary tract infection, site not specified: Secondary | ICD-10-CM | POA: Diagnosis not present

## 2016-08-07 MED ORDER — CYANOCOBALAMIN 1000 MCG/ML IJ SOLN: 1000.0000 ug | INTRAMUSCULAR | 1 refills | Status: DC

## 2016-08-07 NOTE — Telephone Encounter (Signed)
Sent in as instructed and wife informed

## 2016-08-07 NOTE — Telephone Encounter (Signed)
Caller name: Corinna GabFaye Schul Relationship to patient: Wife Can be reached: [phone] [facility] [place] [id number] [phone])     Reason for call: Wife states patient needs a new Rx for B12. Patient has been in the hospital and the injection he had has expired

## 2016-08-08 DIAGNOSIS — N390 Urinary tract infection, site not specified: Secondary | ICD-10-CM | POA: Diagnosis not present

## 2016-08-08 DIAGNOSIS — N319 Neuromuscular dysfunction of bladder, unspecified: Secondary | ICD-10-CM | POA: Diagnosis not present

## 2016-08-08 DIAGNOSIS — R339 Retention of urine, unspecified: Secondary | ICD-10-CM | POA: Diagnosis not present

## 2016-08-08 DIAGNOSIS — G894 Chronic pain syndrome: Secondary | ICD-10-CM | POA: Diagnosis not present

## 2016-08-08 DIAGNOSIS — E039 Hypothyroidism, unspecified: Secondary | ICD-10-CM | POA: Diagnosis not present

## 2016-08-08 DIAGNOSIS — A419 Sepsis, unspecified organism: Secondary | ICD-10-CM | POA: Diagnosis not present

## 2016-08-09 ENCOUNTER — Telehealth: Payer: Self-pay | Admitting: Family Medicine

## 2016-08-09 NOTE — Telephone Encounter (Signed)
Pt's spouse Lucendia HerrlichFaye called in because she said that pt was recently in the hospital and they now have him on a special medication list. She says that pt has been taking furosemide for years but it's not currently on his list to take. She would like to be advised on if she should still give spouse medication or not?   Please advise.   CB: [phone]

## 2016-08-09 NOTE — Telephone Encounter (Signed)
Please see if they feel they need a hospital follow up. She can give the Furosemide only when he is swelling. Can monitor and give the medicine as needed.

## 2016-08-10 DIAGNOSIS — A419 Sepsis, unspecified organism: Secondary | ICD-10-CM | POA: Diagnosis not present

## 2016-08-10 DIAGNOSIS — N390 Urinary tract infection, site not specified: Secondary | ICD-10-CM | POA: Diagnosis not present

## 2016-08-10 DIAGNOSIS — E039 Hypothyroidism, unspecified: Secondary | ICD-10-CM | POA: Diagnosis not present

## 2016-08-10 DIAGNOSIS — G894 Chronic pain syndrome: Secondary | ICD-10-CM | POA: Diagnosis not present

## 2016-08-10 DIAGNOSIS — R339 Retention of urine, unspecified: Secondary | ICD-10-CM | POA: Diagnosis not present

## 2016-08-10 DIAGNOSIS — N319 Neuromuscular dysfunction of bladder, unspecified: Secondary | ICD-10-CM | POA: Diagnosis not present

## 2016-08-10 NOTE — Telephone Encounter (Signed)
Spoke with patients spouse Lucendia HerrlichFaye, she voiced her understanding. Patient has appointment on Monday with PCP  PC

## 2016-08-11 DIAGNOSIS — G894 Chronic pain syndrome: Secondary | ICD-10-CM | POA: Diagnosis not present

## 2016-08-11 DIAGNOSIS — N319 Neuromuscular dysfunction of bladder, unspecified: Secondary | ICD-10-CM | POA: Diagnosis not present

## 2016-08-11 DIAGNOSIS — A419 Sepsis, unspecified organism: Secondary | ICD-10-CM | POA: Diagnosis not present

## 2016-08-11 DIAGNOSIS — R339 Retention of urine, unspecified: Secondary | ICD-10-CM | POA: Diagnosis not present

## 2016-08-11 DIAGNOSIS — N390 Urinary tract infection, site not specified: Secondary | ICD-10-CM | POA: Diagnosis not present

## 2016-08-11 DIAGNOSIS — E039 Hypothyroidism, unspecified: Secondary | ICD-10-CM | POA: Diagnosis not present

## 2016-08-14 ENCOUNTER — Ambulatory Visit (INDEPENDENT_AMBULATORY_CARE_PROVIDER_SITE_OTHER): Payer: Medicare Other | Admitting: Family Medicine

## 2016-08-14 ENCOUNTER — Encounter: Payer: Self-pay | Admitting: Family Medicine

## 2016-08-14 DIAGNOSIS — M545 Low back pain: Secondary | ICD-10-CM | POA: Diagnosis not present

## 2016-08-14 DIAGNOSIS — E538 Deficiency of other specified B group vitamins: Secondary | ICD-10-CM | POA: Diagnosis not present

## 2016-08-14 DIAGNOSIS — N319 Neuromuscular dysfunction of bladder, unspecified: Secondary | ICD-10-CM | POA: Diagnosis not present

## 2016-08-14 DIAGNOSIS — A419 Sepsis, unspecified organism: Secondary | ICD-10-CM | POA: Diagnosis not present

## 2016-08-14 DIAGNOSIS — R739 Hyperglycemia, unspecified: Secondary | ICD-10-CM

## 2016-08-14 DIAGNOSIS — G8929 Other chronic pain: Secondary | ICD-10-CM | POA: Diagnosis not present

## 2016-08-14 DIAGNOSIS — T83511D Infection and inflammatory reaction due to indwelling urethral catheter, subsequent encounter: Secondary | ICD-10-CM

## 2016-08-14 DIAGNOSIS — I509 Heart failure, unspecified: Secondary | ICD-10-CM | POA: Diagnosis not present

## 2016-08-14 DIAGNOSIS — S12101S Unspecified nondisplaced fracture of second cervical vertebra, sequela: Secondary | ICD-10-CM

## 2016-08-14 DIAGNOSIS — G894 Chronic pain syndrome: Secondary | ICD-10-CM | POA: Diagnosis not present

## 2016-08-14 DIAGNOSIS — F411 Generalized anxiety disorder: Secondary | ICD-10-CM

## 2016-08-14 DIAGNOSIS — R339 Retention of urine, unspecified: Secondary | ICD-10-CM | POA: Diagnosis not present

## 2016-08-14 DIAGNOSIS — E039 Hypothyroidism, unspecified: Secondary | ICD-10-CM | POA: Diagnosis not present

## 2016-08-14 DIAGNOSIS — N390 Urinary tract infection, site not specified: Secondary | ICD-10-CM | POA: Diagnosis not present

## 2016-08-14 MED ORDER — METHADONE HCL 5 MG PO TABS: 2.5000 mg | ORAL_TABLET | Freq: Three times a day (TID) | ORAL | 0 refills | Status: DC | PRN

## 2016-08-14 MED ORDER — LEVOTHYROXINE SODIUM 25 MCG PO TABS: 25.0000 ug | ORAL_TABLET | Freq: Every day | ORAL | 1 refills | Status: DC

## 2016-08-14 NOTE — Progress Notes (Signed)
Patient ID: Brandon Franklin Michie, male   DOB: 1925-10-10, 83 y.o.   MRN: [id number]   Subjective:    Patient ID: Brandon Franklin Cieslinski, male    DOB: 1925-10-10, 83 y.o.   MRN: [id number]  Chief Complaint  Patient presents with  . Hospitalization Follow-up   I acted as a Neurosurgeonscribe for Dr. Abner GreenspanBlyth. [name], [place]MA  Urinary Tract Infection   This is a new problem. The current episode started 1 to 4 weeks ago. The problem has been resolved. The patient is experiencing no pain. There has been no fever. Pertinent negatives include no vomiting. His past medical history is significant for catheterization.    Patient is in today for a hospital follow up. Patient was seen in the  hospital for an acute febrile illness, also sepsis due to a urinary tract infection.  Today patient states he is doing well, has no acute concerns. He has a long standing indwelling foley catheter and he had sudden onset systemic illness and fever that took him to ER and he was ultimately hospitalizations. He feels well today. He is accompanied by his wife. They offer no acute concerns and report he is acting and eating normally once more. Denies CP/palp/HA/congestion/fevers/GI c/o. Taking meds as prescribed  Past Medical History:  Diagnosis Date  . 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 Allergy    seasonal  . Arthritis   . Cellulitis 09/20/2015  . CHF (congestive heart failure) (HCC)   . Complication of indwelling urinary catheter (HCC)    Suprapubic catheter in place s/p urologic surgery   . Depression   . Hyperglycemia 05/09/2015  . Low back pain 04/04/2015  . Neck fracture (HCC)   . Neuromuscular disorder (HCC)   . Obesity 04/04/2015  . Urinary anomaly   . Vitamin B12 deficiency 04/04/2015    Past Surgical History:  Procedure Laterality Date  . INSERTION OF SUPRAPUBIC CATHETER    . SURGERY SCROTAL / TESTICULAR Right     Family History  Problem Relation Age of Onset  . Cancer Sister     Social History   Social History  . Marital status:  Married    Spouse name: N/A  . Number of children: N/A  . Years of education: N/A   Occupational History  . retired    Social History Main Topics  . Smoking status: Never Smoker  . Smokeless tobacco: Never Used  . Alcohol use No  . Drug use: No  . Sexual activity: No   Other Topics Concern  . Not on file   Social History Narrative  . No narrative on file    Outpatient Medications Prior to Visit  Medication Sig Dispense Refill  . bisacodyl (DULCOLAX) 5 MG EC tablet Take 5 mg by mouth daily as needed.    . cholecalciferol (VITAMIN D) 1000 UNITS tablet Take 1,000 Units by mouth daily.    . cyanocobalamin (,VITAMIN B-12,) 1000 MCG/ML injection Inject 1 mL (1,000 mcg total) into the muscle every 30 (thirty) days. 10 mL 1  . furosemide (LASIX) 40 MG tablet Take 1 tablet (40 mg total) by mouth daily. 30 tablet 5  . gabapentin (NEURONTIN) 100 MG capsule Take 3 capsules (300 mg total) by mouth 3 (three) times daily. 270 capsule 2  . oxybutynin (DITROPAN) 5 MG tablet Take 10 mg by mouth daily.     Marland Kitchen. senna-docusate (SENOKOT-S) 8.6-50 MG per tablet Take 4 tablets by mouth 2 (two) times daily. Only takes as needed.    . Syringe/Needle, Disp, (SYRINGE  3CC/25GX1") 25G X 1" 3 ML MISC To use with vitamin B12 50 each 1  . gabapentin (NEURONTIN) 300 MG capsule Take 300 mg by mouth daily.    Marland Kitchen levothyroxine (SYNTHROID, LEVOTHROID) 25 MCG tablet Take 1 tablet (25 mcg total) by mouth daily before breakfast. Ok to change to [employer] 30 tablet 6  . methadone (DOLOPHINE) 5 MG tablet Take 0.5 tablets (2.5 mg total) by mouth 3 (three) times daily as needed. 45 tablet 0   No facility-administered medications prior to visit.     Allergies  Allergen Reactions  . Ciprofloxacin   . Levaquin [Levofloxacin In D5w] Other (See Comments)  . Doxycycline Hyclate Rash    Review of Systems  Constitutional: Positive for malaise/fatigue. Negative for fever.  HENT: Negative for congestion.   Eyes: Negative  for blurred vision.  Respiratory: Negative for cough and shortness of breath.   Cardiovascular: Negative for chest pain, palpitations and leg swelling.  Gastrointestinal: Negative for vomiting.  Musculoskeletal: Positive for back pain and neck pain.  Skin: Negative for rash.  Neurological: Negative for loss of consciousness and headaches.       Objective:    Physical Exam  Constitutional: He is oriented to person, place, and time. He appears well-developed and well-nourished. No distress.  In wheelchair  HENT:  Head: Normocephalic and atraumatic.  Eyes: Conjunctivae are normal.  Neck: Normal range of motion. No thyromegaly present.  Cardiovascular: Normal rate and regular rhythm.   Murmur heard. Pulmonary/Chest: Effort normal and breath sounds normal. He has no wheezes.  Abdominal: Soft. Bowel sounds are normal. There is no tenderness.  Musculoskeletal: Normal range of motion. He exhibits no edema or deformity.  Neurological: He is alert and oriented to person, place, and time.  Skin: Skin is warm and dry. He is not diaphoretic.  Psychiatric: He has a normal mood and affect.    BP 132/76 (BP Location: Left Arm, Patient Position: Sitting, Cuff Size: Normal)   Pulse 71   Temp 98.1 F (36.7 C) (Oral)   Wt 246 lb (111.6 kg)   SpO2 96%   BMI 34.31 kg/m  Wt Readings from Last 3 Encounters:  08/14/16 246 lb (111.6 kg)  03/23/16 243 lb (110.2 kg)  02/15/16 243 lb (110.2 kg)     Lab Results  Component Value Date   WBC 5.2 03/23/2016   HGB 11.9 (L) 03/23/2016   HCT 35.1 (L) 03/23/2016   PLT 159.0 03/23/2016   GLUCOSE 118 (H) 03/23/2016   ALT 7 03/23/2016   AST 7 03/23/2016   NA 137 03/23/2016   K 4.1 03/23/2016   CL 101 03/23/2016   CREATININE 0.92 03/23/2016   BUN 19 03/23/2016   CO2 31 03/23/2016   TSH 2.78 03/23/2016   INR 1.1 05/17/2008   HGBA1C 5.5 03/23/2016    Lab Results  Component Value Date   TSH 2.78 03/23/2016   Lab Results  Component Value Date     WBC 5.2 03/23/2016   HGB 11.9 (L) 03/23/2016   HCT 35.1 (L) 03/23/2016   MCV 88.2 03/23/2016   PLT 159.0 03/23/2016   Lab Results  Component Value Date   NA 137 03/23/2016   K 4.1 03/23/2016   CO2 31 03/23/2016   GLUCOSE 118 (H) 03/23/2016   BUN 19 03/23/2016   CREATININE 0.92 03/23/2016   BILITOT 0.4 03/23/2016   ALKPHOS 46 03/23/2016   AST 7 03/23/2016   ALT 7 03/23/2016   PROT 6.9 03/23/2016   ALBUMIN  3.4 (L) 03/23/2016   CALCIUM 8.7 03/23/2016   GFR 82.42 03/23/2016   No results found for: CHOL No results found for: HDL No results found for: LDLCALC No results found for: TRIG No results found for: CHOLHDL Lab Results  Component Value Date   HGBA1C 5.5 03/23/2016       Assessment & Plan:   Problem List Items Addressed This Visit    CHF (congestive heart failure) (HCC)    No recent exacerbations. No changes in therapy      Complication of indwelling urinary catheter (HCC)    Recent UTI, secondary to chronic indwelling cath. Asymptomatic today, repeat culture as needed secondary to symptoms.       Neck fracture (HCC)    Pain managed with low dose metadone      Low back pain    Has been maintained on very low dose methadone since his care with Hospice, may continue use      Relevant Medications   methadone (DOLOPHINE) 5 MG tablet   Vitamin B 12 deficiency   Relevant Medications   methadone (DOLOPHINE) 5 MG tablet   Hyperglycemia     minimize simple carbs. Increase exercise as tolerated.        Anxiety state   Relevant Medications   methadone (DOLOPHINE) 5 MG tablet      I am having Mr. Kissoon maintain his bisacodyl, cholecalciferol, oxybutynin, senna-docusate, SYRINGE 3CC/25GX1", furosemide, gabapentin, cyanocobalamin, methadone, and levothyroxine.  Meds ordered this encounter  Medications  . methadone (DOLOPHINE) 5 MG tablet    Sig: Take 0.5 tablets (2.5 mg total) by mouth 3 (three) times daily as needed.    Dispense:  45 tablet    Refill:   0  . levothyroxine (SYNTHROID, LEVOTHROID) 25 MCG tablet    Sig: Take 1 tablet (25 mcg total) by mouth daily before breakfast. Ok to change to [employer]    Dispense:  90 tablet    Refill:  1    CMA served as Neurosurgeon during this visit. History, Physical and Plan performed by medical provider. Documentation and orders reviewed and attested to.  Danise Edge, MD

## 2016-08-14 NOTE — Progress Notes (Signed)
Pre visit review using our clinic review tool, if applicable. No additional management support is needed unless otherwise documented below in the visit note. 

## 2016-08-15 DIAGNOSIS — N390 Urinary tract infection, site not specified: Secondary | ICD-10-CM | POA: Diagnosis not present

## 2016-08-15 DIAGNOSIS — R339 Retention of urine, unspecified: Secondary | ICD-10-CM | POA: Diagnosis not present

## 2016-08-15 DIAGNOSIS — N319 Neuromuscular dysfunction of bladder, unspecified: Secondary | ICD-10-CM | POA: Diagnosis not present

## 2016-08-15 DIAGNOSIS — E039 Hypothyroidism, unspecified: Secondary | ICD-10-CM | POA: Diagnosis not present

## 2016-08-15 DIAGNOSIS — A419 Sepsis, unspecified organism: Secondary | ICD-10-CM | POA: Diagnosis not present

## 2016-08-15 DIAGNOSIS — G894 Chronic pain syndrome: Secondary | ICD-10-CM | POA: Diagnosis not present

## 2016-08-16 DIAGNOSIS — E039 Hypothyroidism, unspecified: Secondary | ICD-10-CM | POA: Diagnosis not present

## 2016-08-16 DIAGNOSIS — N319 Neuromuscular dysfunction of bladder, unspecified: Secondary | ICD-10-CM | POA: Diagnosis not present

## 2016-08-16 DIAGNOSIS — G894 Chronic pain syndrome: Secondary | ICD-10-CM | POA: Diagnosis not present

## 2016-08-16 DIAGNOSIS — N390 Urinary tract infection, site not specified: Secondary | ICD-10-CM | POA: Diagnosis not present

## 2016-08-16 DIAGNOSIS — R339 Retention of urine, unspecified: Secondary | ICD-10-CM | POA: Diagnosis not present

## 2016-08-16 DIAGNOSIS — A419 Sepsis, unspecified organism: Secondary | ICD-10-CM | POA: Diagnosis not present

## 2016-08-17 DIAGNOSIS — G894 Chronic pain syndrome: Secondary | ICD-10-CM | POA: Diagnosis not present

## 2016-08-17 DIAGNOSIS — A419 Sepsis, unspecified organism: Secondary | ICD-10-CM | POA: Diagnosis not present

## 2016-08-17 DIAGNOSIS — N390 Urinary tract infection, site not specified: Secondary | ICD-10-CM | POA: Diagnosis not present

## 2016-08-17 DIAGNOSIS — R339 Retention of urine, unspecified: Secondary | ICD-10-CM | POA: Diagnosis not present

## 2016-08-17 DIAGNOSIS — N319 Neuromuscular dysfunction of bladder, unspecified: Secondary | ICD-10-CM | POA: Diagnosis not present

## 2016-08-17 DIAGNOSIS — E039 Hypothyroidism, unspecified: Secondary | ICD-10-CM | POA: Diagnosis not present

## 2016-08-18 ENCOUNTER — Telehealth: Payer: Self-pay | Admitting: *Deleted

## 2016-08-18 NOTE — Telephone Encounter (Signed)
Received completed and signed Home Health Certification and Plan of Care from Dr. Abner GreenspanBlyth.  Faxed paperwork to [facility] @ [phone]).  Confirmation received.//AB/CMA

## 2016-08-21 ENCOUNTER — Encounter: Payer: Self-pay | Admitting: Family Medicine

## 2016-08-21 DIAGNOSIS — F411 Generalized anxiety disorder: Secondary | ICD-10-CM | POA: Insufficient documentation

## 2016-08-21 NOTE — Assessment & Plan Note (Signed)
No recent exacerbations. No changes in therapy

## 2016-08-21 NOTE — Assessment & Plan Note (Signed)
minimize simple carbs. Increase exercise as tolerated.  

## 2016-08-21 NOTE — Assessment & Plan Note (Addendum)
Recent UTI, secondary to chronic indwelling cath. Asymptomatic today, repeat culture as needed secondary to symptoms.

## 2016-08-21 NOTE — Assessment & Plan Note (Signed)
Pain managed with low dose metadone

## 2016-08-21 NOTE — Assessment & Plan Note (Signed)
Has been maintained on very low dose methadone since his care with Hospice, may continue use

## 2016-08-22 DIAGNOSIS — R339 Retention of urine, unspecified: Secondary | ICD-10-CM | POA: Diagnosis not present

## 2016-08-22 DIAGNOSIS — E039 Hypothyroidism, unspecified: Secondary | ICD-10-CM | POA: Diagnosis not present

## 2016-08-22 DIAGNOSIS — N319 Neuromuscular dysfunction of bladder, unspecified: Secondary | ICD-10-CM | POA: Diagnosis not present

## 2016-08-22 DIAGNOSIS — G894 Chronic pain syndrome: Secondary | ICD-10-CM | POA: Diagnosis not present

## 2016-08-22 DIAGNOSIS — A419 Sepsis, unspecified organism: Secondary | ICD-10-CM | POA: Diagnosis not present

## 2016-08-22 DIAGNOSIS — N390 Urinary tract infection, site not specified: Secondary | ICD-10-CM | POA: Diagnosis not present

## 2016-08-23 DIAGNOSIS — E039 Hypothyroidism, unspecified: Secondary | ICD-10-CM | POA: Diagnosis not present

## 2016-08-23 DIAGNOSIS — R339 Retention of urine, unspecified: Secondary | ICD-10-CM | POA: Diagnosis not present

## 2016-08-23 DIAGNOSIS — N319 Neuromuscular dysfunction of bladder, unspecified: Secondary | ICD-10-CM | POA: Diagnosis not present

## 2016-08-23 DIAGNOSIS — A419 Sepsis, unspecified organism: Secondary | ICD-10-CM | POA: Diagnosis not present

## 2016-08-23 DIAGNOSIS — G894 Chronic pain syndrome: Secondary | ICD-10-CM | POA: Diagnosis not present

## 2016-08-23 DIAGNOSIS — N390 Urinary tract infection, site not specified: Secondary | ICD-10-CM | POA: Diagnosis not present

## 2016-08-24 DIAGNOSIS — G894 Chronic pain syndrome: Secondary | ICD-10-CM | POA: Diagnosis not present

## 2016-08-24 DIAGNOSIS — A419 Sepsis, unspecified organism: Secondary | ICD-10-CM | POA: Diagnosis not present

## 2016-08-24 DIAGNOSIS — N390 Urinary tract infection, site not specified: Secondary | ICD-10-CM | POA: Diagnosis not present

## 2016-08-24 DIAGNOSIS — E039 Hypothyroidism, unspecified: Secondary | ICD-10-CM | POA: Diagnosis not present

## 2016-08-24 DIAGNOSIS — R339 Retention of urine, unspecified: Secondary | ICD-10-CM | POA: Diagnosis not present

## 2016-08-24 DIAGNOSIS — N319 Neuromuscular dysfunction of bladder, unspecified: Secondary | ICD-10-CM | POA: Diagnosis not present

## 2016-08-25 DIAGNOSIS — R339 Retention of urine, unspecified: Secondary | ICD-10-CM | POA: Diagnosis not present

## 2016-08-25 DIAGNOSIS — N319 Neuromuscular dysfunction of bladder, unspecified: Secondary | ICD-10-CM | POA: Diagnosis not present

## 2016-08-25 DIAGNOSIS — G894 Chronic pain syndrome: Secondary | ICD-10-CM | POA: Diagnosis not present

## 2016-08-25 DIAGNOSIS — A419 Sepsis, unspecified organism: Secondary | ICD-10-CM | POA: Diagnosis not present

## 2016-08-25 DIAGNOSIS — N390 Urinary tract infection, site not specified: Secondary | ICD-10-CM | POA: Diagnosis not present

## 2016-08-25 DIAGNOSIS — E039 Hypothyroidism, unspecified: Secondary | ICD-10-CM | POA: Diagnosis not present

## 2016-08-28 DIAGNOSIS — R339 Retention of urine, unspecified: Secondary | ICD-10-CM | POA: Diagnosis not present

## 2016-08-28 DIAGNOSIS — G894 Chronic pain syndrome: Secondary | ICD-10-CM | POA: Diagnosis not present

## 2016-08-28 DIAGNOSIS — N390 Urinary tract infection, site not specified: Secondary | ICD-10-CM | POA: Diagnosis not present

## 2016-08-28 DIAGNOSIS — A419 Sepsis, unspecified organism: Secondary | ICD-10-CM | POA: Diagnosis not present

## 2016-08-28 DIAGNOSIS — E039 Hypothyroidism, unspecified: Secondary | ICD-10-CM | POA: Diagnosis not present

## 2016-08-28 DIAGNOSIS — N319 Neuromuscular dysfunction of bladder, unspecified: Secondary | ICD-10-CM | POA: Diagnosis not present

## 2016-08-29 ENCOUNTER — Telehealth: Payer: Self-pay | Admitting: Family Medicine

## 2016-08-29 DIAGNOSIS — N319 Neuromuscular dysfunction of bladder, unspecified: Secondary | ICD-10-CM | POA: Diagnosis not present

## 2016-08-29 DIAGNOSIS — R339 Retention of urine, unspecified: Secondary | ICD-10-CM | POA: Diagnosis not present

## 2016-08-29 DIAGNOSIS — N390 Urinary tract infection, site not specified: Secondary | ICD-10-CM | POA: Diagnosis not present

## 2016-08-29 DIAGNOSIS — A419 Sepsis, unspecified organism: Secondary | ICD-10-CM | POA: Diagnosis not present

## 2016-08-29 DIAGNOSIS — G894 Chronic pain syndrome: Secondary | ICD-10-CM | POA: Diagnosis not present

## 2016-08-29 DIAGNOSIS — E039 Hypothyroidism, unspecified: Secondary | ICD-10-CM | POA: Diagnosis not present

## 2016-08-29 NOTE — Telephone Encounter (Signed)
Spoke with patient's wife regarding awv. Wife stated that Mr. Brandon Franklin is not feeling well at this time and the she will give office a call back if Mr. Brandon Franklin decides to schedule an appt for his annual wellness.

## 2016-08-31 DIAGNOSIS — G894 Chronic pain syndrome: Secondary | ICD-10-CM | POA: Diagnosis not present

## 2016-08-31 DIAGNOSIS — N390 Urinary tract infection, site not specified: Secondary | ICD-10-CM | POA: Diagnosis not present

## 2016-08-31 DIAGNOSIS — N319 Neuromuscular dysfunction of bladder, unspecified: Secondary | ICD-10-CM | POA: Diagnosis not present

## 2016-08-31 DIAGNOSIS — E039 Hypothyroidism, unspecified: Secondary | ICD-10-CM | POA: Diagnosis not present

## 2016-08-31 DIAGNOSIS — R339 Retention of urine, unspecified: Secondary | ICD-10-CM | POA: Diagnosis not present

## 2016-08-31 DIAGNOSIS — A419 Sepsis, unspecified organism: Secondary | ICD-10-CM | POA: Diagnosis not present

## 2016-09-01 DIAGNOSIS — N390 Urinary tract infection, site not specified: Secondary | ICD-10-CM | POA: Diagnosis not present

## 2016-09-01 DIAGNOSIS — A419 Sepsis, unspecified organism: Secondary | ICD-10-CM | POA: Diagnosis not present

## 2016-09-01 DIAGNOSIS — N319 Neuromuscular dysfunction of bladder, unspecified: Secondary | ICD-10-CM | POA: Diagnosis not present

## 2016-09-01 DIAGNOSIS — E039 Hypothyroidism, unspecified: Secondary | ICD-10-CM | POA: Diagnosis not present

## 2016-09-01 DIAGNOSIS — G894 Chronic pain syndrome: Secondary | ICD-10-CM | POA: Diagnosis not present

## 2016-09-01 DIAGNOSIS — R339 Retention of urine, unspecified: Secondary | ICD-10-CM | POA: Diagnosis not present

## 2016-09-05 ENCOUNTER — Telehealth: Payer: Self-pay | Admitting: Family Medicine

## 2016-09-05 DIAGNOSIS — R339 Retention of urine, unspecified: Secondary | ICD-10-CM | POA: Diagnosis not present

## 2016-09-05 DIAGNOSIS — E039 Hypothyroidism, unspecified: Secondary | ICD-10-CM | POA: Diagnosis not present

## 2016-09-05 DIAGNOSIS — G894 Chronic pain syndrome: Secondary | ICD-10-CM | POA: Diagnosis not present

## 2016-09-05 DIAGNOSIS — A419 Sepsis, unspecified organism: Secondary | ICD-10-CM | POA: Diagnosis not present

## 2016-09-05 DIAGNOSIS — N319 Neuromuscular dysfunction of bladder, unspecified: Secondary | ICD-10-CM | POA: Diagnosis not present

## 2016-09-05 DIAGNOSIS — N390 Urinary tract infection, site not specified: Secondary | ICD-10-CM | POA: Diagnosis not present

## 2016-09-05 MED ORDER — GABAPENTIN 300 MG PO CAPS: 300.0000 mg | ORAL_CAPSULE | Freq: Three times a day (TID) | ORAL | 1 refills | Status: DC

## 2016-09-05 NOTE — Telephone Encounter (Signed)
Insurance will no longer cover 9 per day of gabapentin.  They need him to stay on this, but if you can reduce the amount per day insurance will cover up 6 per day. [facility] [address] [place] Call the wife once responds to message/request

## 2016-09-05 NOTE — Telephone Encounter (Signed)
Just D/C the 100 mg caps and change him to the Gabapentin 300 mg caps, 1 cap po tid, disp #90 with 1 rf

## 2016-09-05 NOTE — Telephone Encounter (Signed)
Updated medication list/sent in new strength/called the patients wife to inform script sent in.

## 2016-09-07 DIAGNOSIS — N390 Urinary tract infection, site not specified: Secondary | ICD-10-CM | POA: Diagnosis not present

## 2016-09-07 DIAGNOSIS — A419 Sepsis, unspecified organism: Secondary | ICD-10-CM | POA: Diagnosis not present

## 2016-09-07 DIAGNOSIS — E039 Hypothyroidism, unspecified: Secondary | ICD-10-CM | POA: Diagnosis not present

## 2016-09-07 DIAGNOSIS — G894 Chronic pain syndrome: Secondary | ICD-10-CM | POA: Diagnosis not present

## 2016-09-07 DIAGNOSIS — N319 Neuromuscular dysfunction of bladder, unspecified: Secondary | ICD-10-CM | POA: Diagnosis not present

## 2016-09-07 DIAGNOSIS — R339 Retention of urine, unspecified: Secondary | ICD-10-CM | POA: Diagnosis not present

## 2016-09-12 DIAGNOSIS — G894 Chronic pain syndrome: Secondary | ICD-10-CM | POA: Diagnosis not present

## 2016-09-12 DIAGNOSIS — N390 Urinary tract infection, site not specified: Secondary | ICD-10-CM | POA: Diagnosis not present

## 2016-09-12 DIAGNOSIS — R339 Retention of urine, unspecified: Secondary | ICD-10-CM | POA: Diagnosis not present

## 2016-09-12 DIAGNOSIS — A419 Sepsis, unspecified organism: Secondary | ICD-10-CM | POA: Diagnosis not present

## 2016-09-12 DIAGNOSIS — N319 Neuromuscular dysfunction of bladder, unspecified: Secondary | ICD-10-CM | POA: Diagnosis not present

## 2016-09-12 DIAGNOSIS — E039 Hypothyroidism, unspecified: Secondary | ICD-10-CM | POA: Diagnosis not present

## 2016-09-13 DIAGNOSIS — R339 Retention of urine, unspecified: Secondary | ICD-10-CM | POA: Diagnosis not present

## 2016-09-13 DIAGNOSIS — E039 Hypothyroidism, unspecified: Secondary | ICD-10-CM | POA: Diagnosis not present

## 2016-09-13 DIAGNOSIS — N390 Urinary tract infection, site not specified: Secondary | ICD-10-CM | POA: Diagnosis not present

## 2016-09-13 DIAGNOSIS — G894 Chronic pain syndrome: Secondary | ICD-10-CM | POA: Diagnosis not present

## 2016-09-13 DIAGNOSIS — N319 Neuromuscular dysfunction of bladder, unspecified: Secondary | ICD-10-CM | POA: Diagnosis not present

## 2016-09-13 DIAGNOSIS — A419 Sepsis, unspecified organism: Secondary | ICD-10-CM | POA: Diagnosis not present

## 2016-09-14 DIAGNOSIS — E039 Hypothyroidism, unspecified: Secondary | ICD-10-CM | POA: Diagnosis not present

## 2016-09-14 DIAGNOSIS — R339 Retention of urine, unspecified: Secondary | ICD-10-CM | POA: Diagnosis not present

## 2016-09-14 DIAGNOSIS — N390 Urinary tract infection, site not specified: Secondary | ICD-10-CM | POA: Diagnosis not present

## 2016-09-14 DIAGNOSIS — A419 Sepsis, unspecified organism: Secondary | ICD-10-CM | POA: Diagnosis not present

## 2016-09-14 DIAGNOSIS — N319 Neuromuscular dysfunction of bladder, unspecified: Secondary | ICD-10-CM | POA: Diagnosis not present

## 2016-09-14 DIAGNOSIS — G894 Chronic pain syndrome: Secondary | ICD-10-CM | POA: Diagnosis not present

## 2016-09-19 DIAGNOSIS — A419 Sepsis, unspecified organism: Secondary | ICD-10-CM | POA: Diagnosis not present

## 2016-09-19 DIAGNOSIS — N319 Neuromuscular dysfunction of bladder, unspecified: Secondary | ICD-10-CM | POA: Diagnosis not present

## 2016-09-19 DIAGNOSIS — G894 Chronic pain syndrome: Secondary | ICD-10-CM | POA: Diagnosis not present

## 2016-09-19 DIAGNOSIS — N390 Urinary tract infection, site not specified: Secondary | ICD-10-CM | POA: Diagnosis not present

## 2016-09-19 DIAGNOSIS — E039 Hypothyroidism, unspecified: Secondary | ICD-10-CM | POA: Diagnosis not present

## 2016-09-19 DIAGNOSIS — R339 Retention of urine, unspecified: Secondary | ICD-10-CM | POA: Diagnosis not present

## 2016-09-21 ENCOUNTER — Ambulatory Visit (INDEPENDENT_AMBULATORY_CARE_PROVIDER_SITE_OTHER): Payer: Medicare Other | Admitting: Family Medicine

## 2016-09-21 ENCOUNTER — Encounter: Payer: Self-pay | Admitting: Family Medicine

## 2016-09-21 DIAGNOSIS — M545 Low back pain: Secondary | ICD-10-CM

## 2016-09-21 DIAGNOSIS — R339 Retention of urine, unspecified: Secondary | ICD-10-CM | POA: Diagnosis not present

## 2016-09-21 DIAGNOSIS — G8929 Other chronic pain: Secondary | ICD-10-CM | POA: Diagnosis not present

## 2016-09-21 DIAGNOSIS — E039 Hypothyroidism, unspecified: Secondary | ICD-10-CM | POA: Diagnosis not present

## 2016-09-21 DIAGNOSIS — E538 Deficiency of other specified B group vitamins: Secondary | ICD-10-CM | POA: Diagnosis not present

## 2016-09-21 DIAGNOSIS — E6609 Other obesity due to excess calories: Secondary | ICD-10-CM | POA: Diagnosis not present

## 2016-09-21 DIAGNOSIS — I509 Heart failure, unspecified: Secondary | ICD-10-CM

## 2016-09-21 DIAGNOSIS — T83511D Infection and inflammatory reaction due to indwelling urethral catheter, subsequent encounter: Secondary | ICD-10-CM

## 2016-09-21 DIAGNOSIS — F411 Generalized anxiety disorder: Secondary | ICD-10-CM

## 2016-09-21 DIAGNOSIS — A419 Sepsis, unspecified organism: Secondary | ICD-10-CM | POA: Diagnosis not present

## 2016-09-21 DIAGNOSIS — N319 Neuromuscular dysfunction of bladder, unspecified: Secondary | ICD-10-CM | POA: Diagnosis not present

## 2016-09-21 DIAGNOSIS — N390 Urinary tract infection, site not specified: Secondary | ICD-10-CM | POA: Diagnosis not present

## 2016-09-21 DIAGNOSIS — G894 Chronic pain syndrome: Secondary | ICD-10-CM | POA: Diagnosis not present

## 2016-09-21 HISTORY — DX: Retention of urine, unspecified: R33.9

## 2016-09-21 MED ORDER — METHADONE HCL 5 MG PO TABS: 2.5000 mg | ORAL_TABLET | Freq: Three times a day (TID) | ORAL | 0 refills | Status: DC | PRN

## 2016-09-21 NOTE — Patient Instructions (Signed)

## 2016-09-21 NOTE — Progress Notes (Signed)
Pre visit review using our clinic review tool, if applicable. No additional management support is needed unless otherwise documented below in the visit note. 

## 2016-09-21 NOTE — Progress Notes (Signed)
Subjective:  I acted as a Neurosurgeon for Dr. Abner Greenspan. [name], [place]   Patient ID: Brandon Franklin, male    DOB: 04/24/1926, 83 y.o.   MRN: [id number]  Chief Complaint  Patient presents with  . Follow-up    HPI  Patient is in today for 6 month follow up on CHF, obesity, neck and back pain. He feels well today. No recent febrile illness or hospitalization. He denies any recent change in his dyspnea although he is very debilitated and essentially wheelchair-bound. His wife is concerned that the increased sediment in his Foley catheter. He denies fevers or abdominal pain. Denies CP/palp/SOB/HA/congestion/fevers/GI c/o. Taking meds as prescribed  Patient Care Team: Bradd Canary, MD as PCP - General (Family Medicine) Bebe Shaggy, MD (Urology)   Past Medical History:  Diagnosis Date  . 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 Allergy    seasonal  . Arthritis   . Cellulitis 09/20/2015  . CHF (congestive heart failure) (HCC)   . Complication of indwelling urinary catheter (HCC)    Suprapubic catheter in place s/p urologic surgery   . Depression   . Hyperglycemia 05/09/2015  . Low back pain 04/04/2015  . Neck fracture (HCC)   . Neuromuscular disorder (HCC)   . Obesity 04/04/2015  . Urinary anomaly   . Urinary retention 09/21/2016  . Vitamin B12 deficiency 04/04/2015    Past Surgical History:  Procedure Laterality Date  . INSERTION OF SUPRAPUBIC CATHETER    . SURGERY SCROTAL / TESTICULAR Right     Family History  Problem Relation Age of Onset  . Cancer Sister     Social History   Social History  . Marital status: Married    Spouse name: N/A  . Number of children: N/A  . Years of education: N/A   Occupational History  . retired    Social History Main Topics  . Smoking status: Never Smoker  . Smokeless tobacco: Never Used  . Alcohol use No  . Drug use: No  . Sexual activity: No   Other Topics Concern  . Not on file   Social History Narrative  . No narrative on file     Outpatient Medications Prior to Visit  Medication Sig Dispense Refill  . bisacodyl (DULCOLAX) 5 MG EC tablet Take 5 mg by mouth daily as needed.    . cholecalciferol (VITAMIN D) 1000 UNITS tablet Take 1,000 Units by mouth daily.    . cyanocobalamin (,VITAMIN B-12,) 1000 MCG/ML injection Inject 1 mL (1,000 mcg total) into the muscle every 30 (thirty) days. 10 mL 1  . furosemide (LASIX) 40 MG tablet Take 1 tablet (40 mg total) by mouth daily. 30 tablet 5  . gabapentin (NEURONTIN) 300 MG capsule Take 1 capsule (300 mg total) by mouth 3 (three) times daily. 90 capsule 1  . levothyroxine (SYNTHROID, LEVOTHROID) 25 MCG tablet Take 1 tablet (25 mcg total) by mouth daily before breakfast. Ok to change to [employer] 90 tablet 1  . oxybutynin (DITROPAN) 5 MG tablet Take 10 mg by mouth daily.     Marland Kitchen senna-docusate (SENOKOT-S) 8.6-50 MG per tablet Take 4 tablets by mouth 2 (two) times daily. Only takes as needed.    . Syringe/Needle, Disp, (SYRINGE 3CC/25GX1") 25G X 1" 3 ML MISC To use with vitamin B12 50 each 1  . methadone (DOLOPHINE) 5 MG tablet Take 0.5 tablets (2.5 mg total) by mouth 3 (three) times daily as needed. 45 tablet 0   No facility-administered medications prior  to visit.     Allergies  Allergen Reactions  . Ciprofloxacin   . Levaquin [Levofloxacin In D5w] Other (See Comments)  . Doxycycline Hyclate Rash    Review of Systems  Constitutional: Negative for fever and malaise/fatigue.  HENT: Negative for congestion.   Eyes: Negative for blurred vision.  Respiratory: Negative for shortness of breath.   Cardiovascular: Positive for leg swelling. Negative for chest pain and palpitations.  Gastrointestinal: Negative for abdominal pain, blood in stool and nausea.  Genitourinary: Negative for dysuria, flank pain and frequency.  Musculoskeletal: Positive for back pain and neck pain. Negative for falls.  Skin: Negative for rash.  Neurological: Negative for dizziness, loss of  consciousness and headaches.  Endo/Heme/Allergies: Negative for environmental allergies.  Psychiatric/Behavioral: Negative for depression. The patient is not nervous/anxious.        Objective:    Physical Exam  Constitutional: He is oriented to person, place, and time. He appears well-developed and well-nourished. No distress.  HENT:  Head: Normocephalic and atraumatic.  Nose: Nose normal.  Eyes: Right eye exhibits no discharge. Left eye exhibits no discharge.  Neck: Normal range of motion. Neck supple.  Cardiovascular: Normal rate and regular rhythm.   No murmur heard. Pulmonary/Chest: Effort normal and breath sounds normal.  Abdominal: Soft. Bowel sounds are normal. There is no tenderness.  Musculoskeletal: He exhibits no edema.  Neurological: He is alert and oriented to person, place, and time.  Skin: Skin is warm and dry.  Psychiatric: He has a normal mood and affect.  Nursing note and vitals reviewed.   BP 126/65 (BP Location: Right Arm, Patient Position: Sitting, Cuff Size: Normal)   Pulse 66   Temp 97.6 F (36.4 C) (Oral)   Resp 18   Wt 246 lb (111.6 kg)   SpO2 100%   BMI 34.31 kg/m  Wt Readings from Last 3 Encounters:  09/21/16 246 lb (111.6 kg)  08/14/16 246 lb (111.6 kg)  03/23/16 243 lb (110.2 kg)   BP Readings from Last 3 Encounters:  09/21/16 126/65  08/14/16 132/76  03/23/16 110/62     Immunization History  Administered Date(s) Administered  . Influenza, High Dose Seasonal PF 03/23/2016  . Influenza,inj,Quad PF,36+ Mos 04/27/2015  . Pneumococcal Conjugate-13 09/13/2015    Health Maintenance  Topic Date Due  . TETANUS/TDAP  08/30/1946  . PNA vac Low Risk Adult (2 of 2 - PPSV23) 09/12/2016  . INFLUENZA VACCINE  Completed    Lab Results  Component Value Date   WBC 5.2 03/23/2016   HGB 11.9 (L) 03/23/2016   HCT 35.1 (L) 03/23/2016   PLT 159.0 03/23/2016   GLUCOSE 118 (H) 03/23/2016   ALT 7 03/23/2016   AST 7 03/23/2016   NA 137 03/23/2016    K 4.1 03/23/2016   CL 101 03/23/2016   CREATININE 0.92 03/23/2016   BUN 19 03/23/2016   CO2 31 03/23/2016   TSH 2.78 03/23/2016   INR 1.1 05/17/2008   HGBA1C 5.5 03/23/2016    Lab Results  Component Value Date   TSH 2.78 03/23/2016   Lab Results  Component Value Date   WBC 5.2 03/23/2016   HGB 11.9 (L) 03/23/2016   HCT 35.1 (L) 03/23/2016   MCV 88.2 03/23/2016   PLT 159.0 03/23/2016   Lab Results  Component Value Date   NA 137 03/23/2016   K 4.1 03/23/2016   CO2 31 03/23/2016   GLUCOSE 118 (H) 03/23/2016   BUN 19 03/23/2016   CREATININE 0.92 03/23/2016  BILITOT 0.4 03/23/2016   ALKPHOS 46 03/23/2016   AST 7 03/23/2016   ALT 7 03/23/2016   PROT 6.9 03/23/2016   ALBUMIN 3.4 (L) 03/23/2016   CALCIUM 8.7 03/23/2016   GFR 82.42 03/23/2016   No results found for: CHOL No results found for: HDL No results found for: LDLCALC No results found for: TRIG No results found for: CHOLHDL Lab Results  Component Value Date   HGBA1C 5.5 03/23/2016         Assessment & Plan:   Problem List Items Addressed This Visit    CHF (congestive heart failure) (HCC)    No recent exacerbation. No changes to meds today      Complication of indwelling urinary catheter (HCC)    Has indwelling catheter managed by urology but he has an increased amount of sediment in the tubing and his wife asks us to change the bag while she is here. It is changed without difficulty.      Obesity    Encouraged DASH diet, decrease po intake and increase exercise as tolerated. Needs 7-8 hours of sleep nightly. Avoid trans fats, eat small, frequent meals every 4-5 hours with lean proteins, complex carbs and healthy fats, avoid simple carbs      Low back pain    Continues to use Methadone in low doses, allowed to continue refills      Relevant Medications   methadone (DOLOPHINE) 5 MG tablet   Vitamin B 12 deficiency   Relevant Medications   methadone (DOLOPHINE) 5 MG tablet   Anxiety state    Relevant Medications   methadone (DOLOPHINE) 5 MG tablet   Urinary retention      I am having Mr. Elyn PeersYork maintain his bisacodyl, cholecalciferol, oxybutynin, senna-docusate, SYRINGE 3CC/25GX1", furosemide, cyanocobalamin, levothyroxine, gabapentin, gabapentin, and methadone.  Meds ordered this encounter  Medications  . gabapentin (NEURONTIN) 100 MG capsule    Sig: Take 300 mg by mouth daily.    Refill:  2  . methadone (DOLOPHINE) 5 MG tablet    Sig: Take 0.5 tablets (2.5 mg total) by mouth 3 (three) times daily as needed.    Dispense:  45 tablet    Refill:  0    CMA served as scribe during this visit. History, Physical and Plan performed by medical provider. Documentation and orders reviewed and attested to.  Danise EdgeStacey Darren Caldron, MD

## 2016-09-21 NOTE — Assessment & Plan Note (Addendum)
Continues to use Methadone in low doses, allowed to continue refills

## 2016-09-22 DIAGNOSIS — E039 Hypothyroidism, unspecified: Secondary | ICD-10-CM | POA: Diagnosis not present

## 2016-09-22 DIAGNOSIS — G894 Chronic pain syndrome: Secondary | ICD-10-CM | POA: Diagnosis not present

## 2016-09-22 DIAGNOSIS — A419 Sepsis, unspecified organism: Secondary | ICD-10-CM | POA: Diagnosis not present

## 2016-09-22 DIAGNOSIS — R339 Retention of urine, unspecified: Secondary | ICD-10-CM | POA: Diagnosis not present

## 2016-09-22 DIAGNOSIS — N319 Neuromuscular dysfunction of bladder, unspecified: Secondary | ICD-10-CM | POA: Diagnosis not present

## 2016-09-22 DIAGNOSIS — N390 Urinary tract infection, site not specified: Secondary | ICD-10-CM | POA: Diagnosis not present

## 2016-09-24 NOTE — Assessment & Plan Note (Deleted)
Has indwelling catheter managed by urology but he has an increased amount of sediment in the tubing and his wife asks us to change the bag while she is here. It is changed without difficulty.

## 2016-09-24 NOTE — Assessment & Plan Note (Signed)
Encouraged DASH diet, decrease po intake and increase exercise as tolerated. Needs 7-8 hours of sleep nightly. Avoid trans fats, eat small, frequent meals every 4-5 hours with lean proteins, complex carbs and healthy fats, avoid simple carbs

## 2016-09-24 NOTE — Assessment & Plan Note (Signed)
No recent exacerbation. No changes to meds today

## 2016-09-24 NOTE — Assessment & Plan Note (Signed)
Has indwelling catheter managed by urology but he has an increased amount of sediment in the tubing and his wife asks us to change the bag while she is here. It is changed without difficulty. 

## 2016-09-25 ENCOUNTER — Telehealth: Payer: Self-pay | Admitting: Family Medicine

## 2016-09-25 NOTE — Telephone Encounter (Signed)
Brandon Franklin - [facility] called in to make provider aware that pt is being discharged from home health care today.

## 2016-10-05 DIAGNOSIS — R319 Hematuria, unspecified: Secondary | ICD-10-CM | POA: Diagnosis not present

## 2016-11-09 DIAGNOSIS — N319 Neuromuscular dysfunction of bladder, unspecified: Secondary | ICD-10-CM | POA: Diagnosis not present

## 2016-11-20 ENCOUNTER — Telehealth: Payer: Self-pay | Admitting: Family Medicine

## 2016-11-20 DIAGNOSIS — E538 Deficiency of other specified B group vitamins: Secondary | ICD-10-CM

## 2016-11-20 DIAGNOSIS — F411 Generalized anxiety disorder: Secondary | ICD-10-CM

## 2016-11-20 NOTE — Telephone Encounter (Signed)
Requesting:   methadone Contract    03/26/15 UDS  none Last OV     09/21/16 Last Refill    #45 no refills on 09/21/16  Please Advise

## 2016-11-20 NOTE — Telephone Encounter (Signed)
Pt wife came by to request methodone and gabapentin refills. Pt ph [phone]. cb

## 2016-11-20 NOTE — Telephone Encounter (Signed)
OK to refill Methadone but needs updated contract and UDS

## 2016-11-21 ENCOUNTER — Other Ambulatory Visit: Payer: Self-pay | Admitting: Family Medicine

## 2016-11-21 MED ORDER — GABAPENTIN 300 MG PO CAPS: 300.0000 mg | ORAL_CAPSULE | Freq: Three times a day (TID) | ORAL | 1 refills | Status: DC

## 2016-11-21 MED ORDER — METHADONE HCL 5 MG PO TABS: 2.5000 mg | ORAL_TABLET | Freq: Three times a day (TID) | ORAL | 0 refills | Status: DC | PRN

## 2016-11-21 NOTE — Telephone Encounter (Signed)
Contract was signed on 08/03/16 Patient does need UDS.   Called patients wife and let her know that her husband needs a UDS she has agreed and will be in this Thursday.     PC

## 2016-11-23 ENCOUNTER — Encounter: Payer: Self-pay | Admitting: Family Medicine

## 2016-11-23 DIAGNOSIS — Z79891 Long term (current) use of opiate analgesic: Secondary | ICD-10-CM | POA: Diagnosis not present

## 2016-12-06 ENCOUNTER — Telehealth: Payer: Self-pay | Admitting: Family Medicine

## 2016-12-06 NOTE — Telephone Encounter (Signed)
I suspect he received automated [facility] call regarding AWV, as I do not see where anyone from this office has called him recently. AWV is recommended but not required. They can certainly decline this service.  He is due for follow-up w/ Dr. Abner GreenspanBlyth in September (per last AVS) and can schedule that appointment at his convenience.  Jason-[facility] you [facility]?

## 2016-12-06 NOTE — Telephone Encounter (Signed)
Called cindy left message to call back and schedule f/u visit with dr Abner Greenspanblyth in September. cb

## 2016-12-06 NOTE — Telephone Encounter (Signed)
Daughter Marcello Fennelcynthia mcpherson called says pt and his wife upset about ph calls for AWV for mcr. Can someone check with dr blyth to be see if we can do minimal office visit for this 83 yo male instead.

## 2016-12-07 DIAGNOSIS — N319 Neuromuscular dysfunction of bladder, unspecified: Secondary | ICD-10-CM | POA: Diagnosis not present

## 2017-01-04 DIAGNOSIS — N319 Neuromuscular dysfunction of bladder, unspecified: Secondary | ICD-10-CM | POA: Diagnosis not present

## 2017-01-17 ENCOUNTER — Telehealth: Payer: Self-pay | Admitting: Family Medicine

## 2017-01-17 DIAGNOSIS — E538 Deficiency of other specified B group vitamins: Secondary | ICD-10-CM

## 2017-01-17 DIAGNOSIS — F411 Generalized anxiety disorder: Secondary | ICD-10-CM

## 2017-01-17 NOTE — Telephone Encounter (Signed)
Caller name: Mrs Elyn PeersYork  Relation to IO:NGEXBMpt:spouse  Call back [phone]   Reason for call:  Patient requesting a refill methadone (DOLOPHINE) 5 MG tablet

## 2017-01-18 MED ORDER — METHADONE HCL 5 MG PO TABS: 2.5000 mg | ORAL_TABLET | Freq: Three times a day (TID) | ORAL | 0 refills | Status: DC | PRN

## 2017-01-18 NOTE — Telephone Encounter (Signed)
OK to refill needs contract updated next time he is able. They have a hard time getting him in here

## 2017-01-18 NOTE — Telephone Encounter (Signed)
Called the wife informed to pickup hardcopy/informed to do contract when able to do.

## 2017-01-18 NOTE — Telephone Encounter (Signed)
Requesting:    methadone Contract     03/26/2015 UDS   Low risk next is due on 05/26/2017 Last OV    09/21/2016----next appt is on 03/08/2017 Last Refill     #45 on 11/21/2016  Please Advise

## 2017-02-01 DIAGNOSIS — N319 Neuromuscular dysfunction of bladder, unspecified: Secondary | ICD-10-CM | POA: Diagnosis not present

## 2017-02-19 ENCOUNTER — Telehealth: Payer: Self-pay | Admitting: Family Medicine

## 2017-02-19 NOTE — Telephone Encounter (Signed)
Ok to refill methadone with same sig, same strength, same number

## 2017-02-19 NOTE — Telephone Encounter (Signed)
Relation to FP:ULGS Call back number:[phone]  Reason for call:  Spouse requesting methadone (DOLOPHINE) 5 MG tablet refill

## 2017-02-19 NOTE — Telephone Encounter (Signed)
Requesting:   Methadone Contract   Signed on 08/14/2016 UDS   Low risk next is due on 05/26/2017 Last OV    09/21/2016---future appt is on 03/08/2017 Last Refill    #45 no refills on 01/18/2017  Please Advise

## 2017-02-20 NOTE — Telephone Encounter (Signed)
Patient calling to check the status of this rx

## 2017-02-21 ENCOUNTER — Other Ambulatory Visit: Payer: Self-pay

## 2017-02-21 DIAGNOSIS — F411 Generalized anxiety disorder: Secondary | ICD-10-CM

## 2017-02-21 DIAGNOSIS — E538 Deficiency of other specified B group vitamins: Secondary | ICD-10-CM

## 2017-02-21 MED ORDER — METHADONE HCL 5 MG PO TABS: 2.5000 mg | ORAL_TABLET | Freq: Three times a day (TID) | ORAL | 0 refills | Status: DC | PRN

## 2017-02-21 NOTE — Telephone Encounter (Signed)
Dr. Patsy Lager, Dr. Abner Greenspan originally gave the ok yesterday for this patient to have a refill for methadone with same sig, same strength, same number. Please advise. His wife is in the lobby wait for Rx. LB

## 2017-02-21 NOTE — Telephone Encounter (Signed)
Rx printed and signed by Dr Patsy Lager and given to front office.

## 2017-02-22 ENCOUNTER — Telehealth: Payer: Self-pay | Admitting: Family Medicine

## 2017-02-22 MED ORDER — GABAPENTIN 300 MG PO CAPS: 300.0000 mg | ORAL_CAPSULE | Freq: Three times a day (TID) | ORAL | 1 refills | Status: DC

## 2017-02-22 NOTE — Telephone Encounter (Signed)
Caller name: Avyan Pirri Relationship to patient: self Can be reached: [phone] home or  Pharmacy: [id number] - [place], [place] - [address]  Reason for call: pt is needing refill on gabapentin. Pt will be out tomorrow. Please call to notify when sent in.

## 2017-02-22 NOTE — Telephone Encounter (Signed)
rx sent   pc 

## 2017-03-08 ENCOUNTER — Encounter: Payer: Medicare Other | Admitting: Family Medicine

## 2017-03-08 DIAGNOSIS — N319 Neuromuscular dysfunction of bladder, unspecified: Secondary | ICD-10-CM | POA: Diagnosis not present

## 2017-03-13 ENCOUNTER — Telehealth: Payer: Self-pay | Admitting: Family Medicine

## 2017-03-13 MED ORDER — LEVOTHYROXINE SODIUM 25 MCG PO TABS: 25.0000 ug | ORAL_TABLET | Freq: Every day | ORAL | 1 refills | Status: DC

## 2017-03-13 NOTE — Telephone Encounter (Signed)
°  Relation to ZO:XWRUEApt:spouse   Call back number:[phone] [facility] [place] [id number] [phone] (Phone) [phone])     Reason for call:  Patient requesting a 90 day supply levothyroxine (SYNTHROID, LEVOTHROID) 25 MCG tablet, please advise

## 2017-03-13 NOTE — Telephone Encounter (Signed)
rx sent to pharmacy.  PC 

## 2017-03-21 ENCOUNTER — Telehealth: Payer: Self-pay | Admitting: Family Medicine

## 2017-03-21 NOTE — Telephone Encounter (Signed)
NO REFILLS remain for Levothyroxin. Verified synthroid script 90 days sent to [facility] on [address] on 03/13/17. Pt spouse will call there to conf they have it.

## 2017-03-22 ENCOUNTER — Ambulatory Visit: Payer: Medicare Other | Admitting: Family Medicine

## 2017-04-02 ENCOUNTER — Other Ambulatory Visit: Payer: Self-pay | Admitting: Family Medicine

## 2017-04-02 DIAGNOSIS — F411 Generalized anxiety disorder: Secondary | ICD-10-CM

## 2017-04-02 DIAGNOSIS — E538 Deficiency of other specified B group vitamins: Secondary | ICD-10-CM

## 2017-04-02 NOTE — Telephone Encounter (Signed)
Caller name: Mrs. Waren Relation to WU:[id number] Call back number: [phone] Pharmacy:[facility]  Reason for call: pt is needing rx methadone (DOLOPHINE) 5 MG tablet

## 2017-04-03 ENCOUNTER — Other Ambulatory Visit: Payer: Self-pay | Admitting: Family Medicine

## 2017-04-03 DIAGNOSIS — E538 Deficiency of other specified B group vitamins: Secondary | ICD-10-CM

## 2017-04-03 DIAGNOSIS — F411 Generalized anxiety disorder: Secondary | ICD-10-CM

## 2017-04-03 MED ORDER — METHADONE HCL 5 MG PO TABS
2.5000 mg | ORAL_TABLET | Freq: Three times a day (TID) | ORAL | 0 refills | Status: DC | PRN
Start: 2017-04-03 — End: 2017-05-10

## 2017-04-03 NOTE — Progress Notes (Signed)
Wife given refill

## 2017-04-03 NOTE — Telephone Encounter (Signed)
Patients wife is here in the office Dr. Abner Greenspan made aware she states she is coming to pick up her Husbands medication. No one has called her to pick the medication up she came on her own. Gwen up front stated she seems a little disoriented, and confused.

## 2017-04-03 NOTE — Telephone Encounter (Signed)
I signed and gave to his wife

## 2017-04-03 NOTE — Telephone Encounter (Signed)
Requesting:methadone Contract:yes UDS:low risk nxt scrn  05/26/17 Last OV/:09/21/16 Next OV:05/28/17 Last Refill:02/21/17  #[id number]   Please advise

## 2017-04-05 DIAGNOSIS — N319 Neuromuscular dysfunction of bladder, unspecified: Secondary | ICD-10-CM | POA: Diagnosis not present

## 2017-04-18 DIAGNOSIS — R300 Dysuria: Secondary | ICD-10-CM | POA: Diagnosis not present

## 2017-04-18 DIAGNOSIS — N390 Urinary tract infection, site not specified: Secondary | ICD-10-CM | POA: Diagnosis not present

## 2017-04-18 DIAGNOSIS — N319 Neuromuscular dysfunction of bladder, unspecified: Secondary | ICD-10-CM | POA: Diagnosis not present

## 2017-04-18 DIAGNOSIS — N3942 Incontinence without sensory awareness: Secondary | ICD-10-CM | POA: Diagnosis not present

## 2017-04-18 DIAGNOSIS — R319 Hematuria, unspecified: Secondary | ICD-10-CM | POA: Diagnosis not present

## 2017-04-30 ENCOUNTER — Telehealth: Payer: Self-pay | Admitting: Family Medicine

## 2017-04-30 NOTE — Telephone Encounter (Signed)
Pt daughter Brandon Franklin called says she checks in with parents daily and pt seems more confused and not doing well. Requesting Abner GreenspanBlyth order Home Health Eval.  Daughter states pt has supra pubic tube and develops sepsis quickly. Pt currently taking antibiotics for UTI and yet  He still has confusion and not doing well. Please call Brandon Franklin [phone].

## 2017-05-01 NOTE — Telephone Encounter (Signed)
We might be able to get home health out for foley catheter management. Check with daughter and see if they want a particular company and then we can call the company and see if they can justify home health for management of indwelling foley cath.

## 2017-05-01 NOTE — Telephone Encounter (Signed)
Spoke with pt's daughter. She states PCP responded to this concern via pt's spouse chart. Reviewed response. She states her mom doesn't want home health out for medication management at this time. Daughter wonders if home health would come out due to the indwelling cath that pt has? Reports the is getting it changed monthly at urology office? But spouse is having difficulty with her mobility and memory as well. Please advise?

## 2017-05-03 DIAGNOSIS — Z960 Presence of urogenital implants: Secondary | ICD-10-CM | POA: Diagnosis not present

## 2017-05-03 DIAGNOSIS — B356 Tinea cruris: Secondary | ICD-10-CM | POA: Diagnosis not present

## 2017-05-03 DIAGNOSIS — N319 Neuromuscular dysfunction of bladder, unspecified: Secondary | ICD-10-CM | POA: Diagnosis not present

## 2017-05-03 DIAGNOSIS — N390 Urinary tract infection, site not specified: Secondary | ICD-10-CM | POA: Diagnosis not present

## 2017-05-04 NOTE — Telephone Encounter (Signed)
Called patient left message for Arline AspCindy to give me a call back about referral location

## 2017-05-04 NOTE — Telephone Encounter (Signed)
You can try [facility], [facility] or [facility]. I usually use the DME order and free type what is needed and the Dx code and fax it to one of these agencies.

## 2017-05-04 NOTE — Telephone Encounter (Signed)
Can you advise me on home health info that we use

## 2017-05-10 ENCOUNTER — Other Ambulatory Visit: Payer: Self-pay | Admitting: Family Medicine

## 2017-05-10 DIAGNOSIS — F411 Generalized anxiety disorder: Secondary | ICD-10-CM

## 2017-05-10 DIAGNOSIS — E538 Deficiency of other specified B group vitamins: Secondary | ICD-10-CM

## 2017-05-10 MED ORDER — METHADONE HCL 5 MG PO TABS: 2.5000 mg | ORAL_TABLET | Freq: Three times a day (TID) | ORAL | 0 refills | Status: DC | PRN

## 2017-05-10 MED ORDER — GABAPENTIN 300 MG PO CAPS: 300.0000 mg | ORAL_CAPSULE | Freq: Three times a day (TID) | ORAL | 1 refills | Status: DC

## 2017-05-10 NOTE — Telephone Encounter (Signed)
Caller name:Mrs. Rorrer Relation to ZO:XWRUEApt:spouse Call back number:[phone] Pharmacy:  Reason for call: pt is needing rx methadone (DOLOPHINE) 5 MG tablet. Please call when available and ready for pick up

## 2017-05-10 NOTE — Telephone Encounter (Signed)
Requesting:methadone Contract:yes UDS:no Last OV:3 22 18  Next OV:11 26 18  Last Refill:04/03/17    #[id number]   Please advise

## 2017-05-10 NOTE — Telephone Encounter (Signed)
Pt also needs rxgabapentin (NEURONTIN) 300 MG

## 2017-05-11 NOTE — Telephone Encounter (Signed)
rx printed  Awaiting pcp sig 

## 2017-05-14 ENCOUNTER — Telehealth: Payer: Self-pay | Admitting: Family Medicine

## 2017-05-14 NOTE — Telephone Encounter (Signed)
methadone refill needed.  Call daughter Starleen Bluecindy mcpherson [phone].

## 2017-05-15 NOTE — Telephone Encounter (Signed)
rx ready for pick up patient notified  

## 2017-05-17 NOTE — Telephone Encounter (Signed)
Patient notified of rx pick up  

## 2017-05-28 ENCOUNTER — Ambulatory Visit (INDEPENDENT_AMBULATORY_CARE_PROVIDER_SITE_OTHER): Payer: Medicare Other | Admitting: Family Medicine

## 2017-05-28 ENCOUNTER — Encounter: Payer: Medicare Other | Admitting: Family Medicine

## 2017-05-28 VITALS — BP 94/56 | HR 76 | Temp 97.7°F | Resp 14 | Ht 70.87 in | Wt 246.0 lb

## 2017-05-28 DIAGNOSIS — E039 Hypothyroidism, unspecified: Secondary | ICD-10-CM | POA: Diagnosis not present

## 2017-05-28 DIAGNOSIS — R339 Retention of urine, unspecified: Secondary | ICD-10-CM | POA: Diagnosis not present

## 2017-05-28 DIAGNOSIS — B379 Candidiasis, unspecified: Secondary | ICD-10-CM | POA: Diagnosis not present

## 2017-05-28 DIAGNOSIS — Z0000 Encounter for general adult medical examination without abnormal findings: Secondary | ICD-10-CM | POA: Diagnosis not present

## 2017-05-28 DIAGNOSIS — R739 Hyperglycemia, unspecified: Secondary | ICD-10-CM | POA: Diagnosis not present

## 2017-05-28 DIAGNOSIS — E538 Deficiency of other specified B group vitamins: Secondary | ICD-10-CM | POA: Diagnosis not present

## 2017-05-28 DIAGNOSIS — Z23 Encounter for immunization: Secondary | ICD-10-CM | POA: Diagnosis not present

## 2017-05-28 DIAGNOSIS — I509 Heart failure, unspecified: Secondary | ICD-10-CM

## 2017-05-28 MED ORDER — NYSTATIN 100000 UNIT/GM EX CREA: 1.0000 "application " | TOPICAL_CREAM | Freq: Two times a day (BID) | CUTANEOUS | 0 refills | Status: DC

## 2017-05-28 MED ORDER — FLUCONAZOLE 150 MG PO TABS: ORAL_TABLET | ORAL | 3 refills | Status: DC

## 2017-05-28 NOTE — Assessment & Plan Note (Signed)
On Levothyroxine, continue to monitor 

## 2017-05-28 NOTE — Assessment & Plan Note (Signed)
hgba1c acceptable, minimize simple carbs. Increase exercise as tolerated.  

## 2017-05-28 NOTE — Assessment & Plan Note (Signed)
Check labs, no recent exacerbation

## 2017-05-28 NOTE — Progress Notes (Signed)
Subjective:  I acted as a Neurosurgeon for [employer] Fuller Song, RMA   Patient ID: Brandon Franklin, male    DOB: 1926-02-02, 83 y.o.   MRN: [id number]  Chief Complaint  Patient presents with  . Annual Exam    HPI  Patient is in today for annual exam and follow-up on chronic medical conditions.  He is accompanied by his wife and daughters.  Overall is doing well at home at this time.  Is eating well and sleeping well.  Pain is well controlled on very low doses of methadone most days.  Their biggest concern is he has significant skin breakdown in his intertriginous regions and on his glands recently.  They have tried over-the-counter antifungals with no great relief.  No fevers or chills but he has significant itching and discomfort.  The skin continues to breakdown further.  No recent febrile illness or hospitalizations. Denies CP/palp/SOB/HA/congestion/fevers/GI or GU c/o. Taking meds as prescribed  Patient Care Team: Bradd Canary, MD as PCP - General (Family Medicine) Bebe Shaggy., MD (Urology)   Past Medical History:  Diagnosis Date  . 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 Allergy    seasonal  . Arthritis   . Cellulitis 09/20/2015  . CHF (congestive heart failure) (HCC)   . Complication of indwelling urinary catheter (HCC)    Suprapubic catheter in place s/p urologic surgery   . Depression   . Hyperglycemia 05/09/2015  . Low back pain 04/04/2015  . Neck fracture (HCC)   . Neuromuscular disorder (HCC)   . Obesity 04/04/2015  . Preventative health care 05/30/2017  . Urinary anomaly   . Urinary retention 09/21/2016  . Vitamin B12 deficiency 04/04/2015    Past Surgical History:  Procedure Laterality Date  . INSERTION OF SUPRAPUBIC CATHETER    . SURGERY SCROTAL / TESTICULAR Right     Family History  Problem Relation Age of Onset  . Cancer Sister     Social History   Socioeconomic History  . Marital status: Married    Spouse name: Not on file  . Number of children: Not on file   . Years of education: Not on file  . Highest education level: Not on file  Social Needs  . Financial resource strain: Not on file  . Food insecurity - worry: Not on file  . Food insecurity - inability: Not on file  . Transportation needs - medical: Not on file  . Transportation needs - non-medical: Not on file  Occupational History  . Occupation: retired  Tobacco Use  . Smoking status: Never Smoker  . Smokeless tobacco: Never Used  Substance and Sexual Activity  . Alcohol use: No    Alcohol/week: 0.0 oz  . Drug use: No  . Sexual activity: No  Other Topics Concern  . Not on file  Social History Narrative  . Not on file    Outpatient Medications Prior to Visit  Medication Sig Dispense Refill  . bisacodyl (DULCOLAX) 5 MG EC tablet Take 5 mg by mouth daily as needed.    . cholecalciferol (VITAMIN D) 1000 UNITS tablet Take 1,000 Units by mouth daily.    . cyanocobalamin (,VITAMIN B-12,) 1000 MCG/ML injection Inject 1 mL (1,000 mcg total) into the muscle every 30 (thirty) days. 10 mL 1  . furosemide (LASIX) 40 MG tablet TAKE ONE TABLET BY MOUTH ONCE DAILY 90 tablet 1  . gabapentin (NEURONTIN) 300 MG capsule Take 1 capsule (300 mg total) 3 (three) times daily by mouth. 90 capsule  1  . levothyroxine (SYNTHROID, LEVOTHROID) 25 MCG tablet Take 1 tablet (25 mcg total) by mouth daily before breakfast. Ok to change to [employer] 90 tablet 1  . methadone (DOLOPHINE) 5 MG tablet Take 0.5 tablets (2.5 mg total) 3 (three) times daily as needed by mouth. 45 tablet 0  . oxybutynin (DITROPAN) 5 MG tablet Take 10 mg by mouth daily.     Marland Kitchen. senna-docusate (SENOKOT-S) 8.6-50 MG per tablet Take 4 tablets by mouth 2 (two) times daily. Only takes as needed.    . Syringe/Needle, Disp, (SYRINGE 3CC/25GX1") 25G X 1" 3 ML MISC To use with vitamin B12 50 each 1   No facility-administered medications prior to visit.     Allergies  Allergen Reactions  . Ciprofloxacin   . Levaquin [Levofloxacin In D5w]  Other (See Comments)  . Doxycycline Hyclate Rash    Review of Systems  Constitutional: Negative for fever and malaise/fatigue.  HENT: Negative for congestion.   Eyes: Negative for blurred vision.  Respiratory: Negative for shortness of breath.   Cardiovascular: Negative for chest pain, palpitations and leg swelling.  Gastrointestinal: Negative for abdominal pain, blood in stool and nausea.  Genitourinary: Negative for dysuria and frequency.       Noted erythema and skin breakdown in intertriginal areas.  Musculoskeletal: Negative for falls.  Skin: Positive for itching and rash.  Neurological: Negative for dizziness, loss of consciousness and headaches.  Endo/Heme/Allergies: Negative for environmental allergies.  Psychiatric/Behavioral: Negative for depression. The patient is not nervous/anxious.        Objective:    Physical Exam  Constitutional: He is oriented to person, place, and time. He appears well-developed and well-nourished. No distress.  HENT:  Head: Normocephalic and atraumatic.  Eyes: Conjunctivae are normal.  Neck: Neck supple. No thyromegaly present.  Cardiovascular: Normal rate, regular rhythm and normal heart sounds.  No murmur heard. Pulmonary/Chest: Effort normal and breath sounds normal. No respiratory distress. He has no wheezes.  Abdominal: Soft. Bowel sounds are normal. He exhibits no mass. There is no tenderness.  Genitourinary:  Genitourinary Comments: Penile tip and b/l intertriginal regions erythematous and macerated  Musculoskeletal: He exhibits no edema.  Lymphadenopathy:    He has no cervical adenopathy.  Neurological: He is alert and oriented to person, place, and time.  Skin: Skin is warm and dry.  Psychiatric: He has a normal mood and affect. His behavior is normal.    BP (!) 94/56 (BP Location: Left Arm, Patient Position: Sitting, Cuff Size: Large)   Pulse 76   Temp 97.7 F (36.5 C) (Oral)   Resp 14   Ht 5' 10.87" (1.8 m)   Wt 246 lb  (111.6 kg)   BMI 34.44 kg/m  Wt Readings from Last 3 Encounters:  05/28/17 246 lb (111.6 kg)  09/21/16 246 lb (111.6 kg)  08/14/16 246 lb (111.6 kg)   BP Readings from Last 3 Encounters:  05/28/17 (!) 94/56  09/21/16 126/65  08/14/16 132/76     Immunization History  Administered Date(s) Administered  . Influenza, High Dose Seasonal PF 03/23/2016, 05/28/2017  . Influenza,inj,Quad PF,6+ Mos 04/27/2015  . Pneumococcal Conjugate-13 09/13/2015    Health Maintenance  Topic Date Due  . TETANUS/TDAP  08/30/1946  . PNA vac Low Risk Adult (2 of 2 - PPSV23) 09/12/2016  . INFLUENZA VACCINE  Completed    Lab Results  Component Value Date   WBC 6.4 05/28/2017   HGB 11.8 (L) 05/28/2017   HCT 36.0 (L) 05/28/2017  PLT 181.0 05/28/2017   GLUCOSE 118 (H) 05/28/2017   CHOL 133 05/28/2017   TRIG 56.0 05/28/2017   HDL 39.60 05/28/2017   LDLCALC 82 05/28/2017   ALT 5 05/28/2017   AST 9 05/28/2017   NA 138 05/28/2017   K 4.1 05/28/2017   CL 101 05/28/2017   CREATININE 0.91 05/28/2017   BUN 17 05/28/2017   CO2 30 05/28/2017   TSH 3.70 05/28/2017   INR 1.1 05/17/2008   HGBA1C 5.8 05/28/2017    Lab Results  Component Value Date   TSH 3.70 05/28/2017   Lab Results  Component Value Date   WBC 6.4 05/28/2017   HGB 11.8 (L) 05/28/2017   HCT 36.0 (L) 05/28/2017   MCV 89.0 05/28/2017   PLT 181.0 05/28/2017   Lab Results  Component Value Date   NA 138 05/28/2017   K 4.1 05/28/2017   CO2 30 05/28/2017   GLUCOSE 118 (H) 05/28/2017   BUN 17 05/28/2017   CREATININE 0.91 05/28/2017   BILITOT 0.5 05/28/2017   ALKPHOS 49 05/28/2017   AST 9 05/28/2017   ALT 5 05/28/2017   PROT 6.9 05/28/2017   ALBUMIN 3.6 05/28/2017   CALCIUM 9.1 05/28/2017   GFR 83.24 05/28/2017   Lab Results  Component Value Date   CHOL 133 05/28/2017   Lab Results  Component Value Date   HDL 39.60 05/28/2017   Lab Results  Component Value Date   LDLCALC 82 05/28/2017   Lab Results  Component  Value Date   TRIG 56.0 05/28/2017   Lab Results  Component Value Date   CHOLHDL 3 05/28/2017   Lab Results  Component Value Date   HGBA1C 5.8 05/28/2017         Assessment & Plan:   Problem List Items Addressed This Visit    CHF (congestive heart failure) (HCC)    Check labs, no recent exacerbation      Relevant Orders   CBC (Completed)   Lipid panel (Completed)   Vitamin B 12 deficiency    Check level today      Relevant Orders   Vitamin B12 (Completed)   Hyperglycemia    hgba1c acceptable, minimize simple carbs. Increase exercise as tolerated.       Relevant Orders   Hemoglobin A1c (Completed)   Comprehensive metabolic panel (Completed)   Lipid panel (Completed)   Urinary retention    Has a permanent indwelling foley cath and gets irritation of skin frequently. Will have home health monitor for a time and provide education to family      Hypothyroid    On Levothyroxine, continue to monitor      Relevant Orders   Lipid panel (Completed)   TSH (Completed)   Candidiasis    Has significant maceration and skin break down in groin and needs help with wound care from home health to monitor and adjust treatments. Is prescribed Diflucan to use weekly and Nystatin cream to use bid prn      Relevant Medications   nystatin cream (MYCOSTATIN)   fluconazole (DIFLUCAN) 150 MG tablet   Other Relevant Orders   Ambulatory referral to Home Health   Preventative health care    Patient encouraged to maintain heart healthy diet, regular exercise, adequate sleep. Consider daily probiotics. Take medications as prescribed       Other Visit Diagnoses    Needs flu shot    -  Primary   Relevant Orders   Flu vaccine HIGH DOSE PF (Fluzone High  dose) (Completed)      I am having Nathanial RancherVernon Gagliardo start on nystatin cream and fluconazole. I am also having him maintain his bisacodyl, cholecalciferol, oxybutynin, senna-docusate, SYRINGE 3CC/25GX1", cyanocobalamin, furosemide,  levothyroxine, gabapentin, and methadone.  Meds ordered this encounter  Medications  . nystatin cream (MYCOSTATIN)    Sig: Apply 1 application topically 2 (two) times daily.    Dispense:  30 g    Refill:  0  . fluconazole (DIFLUCAN) 150 MG tablet    Sig: Weekly prn rash    Dispense:  4 tablet    Refill:  3    CMA served as scribe during this visit. History, Physical and Plan performed by medical provider. Documentation and orders reviewed and attested to.  Danise EdgeStacey [name], MD

## 2017-05-28 NOTE — Assessment & Plan Note (Signed)
Check level today 

## 2017-05-28 NOTE — Patient Instructions (Signed)
Encouraged increased hydration and fiber in diet. Daily probiotics. If bowels not moving can use MOM 2 tbls po in 4 oz of warm prune juice by mouth every 2-3 days. If no results then repeat in 4 hours with  Dulcolax suppository pr, may repeat again in 4 more hours as needed. Seek care if symptoms worsen. Consider daily Miralax and/or Dulcolax if symptoms persist.   Miralax and Benefiber once or twice daily Preventive Care 65 Years and Older, Male Preventive care refers to lifestyle choices and visits with your health care provider that can promote health and wellness. What does preventive care include?  A yearly physical exam. This is also called an annual well check.  Dental exams once or twice a year.  Routine eye exams. Ask your health care provider how often you should have your eyes checked.  Personal lifestyle choices, including: ? Daily care of your teeth and gums. ? Regular physical activity. ? Eating a healthy diet. ? Avoiding tobacco and drug use. ? Limiting alcohol use. ? Practicing safe sex. ? Taking low doses of aspirin every day. ? Taking vitamin and mineral supplements as recommended by your health care provider. What happens during an annual well check? The services and screenings done by your health care provider during your annual well check will depend on your age, overall health, lifestyle risk factors, and family history of disease. Counseling Your health care provider may ask you questions about your:  Alcohol use.  Tobacco use.  Drug use.  Emotional well-being.  Home and relationship well-being.  Sexual activity.  Eating habits.  History of falls.  Memory and ability to understand (cognition).  Work and work Statistician.  Screening You may have the following tests or measurements:  Height, weight, and BMI.  Blood pressure.  Lipid and cholesterol levels. These may be checked every 5 years, or more frequently if you are over 81 years  old.  Skin check.  Lung cancer screening. You may have this screening every year starting at age 81 if you have a 30-pack-year history of smoking and currently smoke or have quit within the past 15 years.  Fecal occult blood test (FOBT) of the stool. You may have this test every year starting at age 81.  Flexible sigmoidoscopy or colonoscopy. You may have a sigmoidoscopy every 5 years or a colonoscopy every 10 years starting at age 81.  Prostate cancer screening. Recommendations will vary depending on your family history and other risks.  Hepatitis C blood test.  Hepatitis B blood test.  Sexually transmitted disease (STD) testing.  Diabetes screening. This is done by checking your blood sugar (glucose) after you have not eaten for a while (fasting). You may have this done every 1-3 years.  Abdominal aortic aneurysm (AAA) screening. You may need this if you are a current or former smoker.  Osteoporosis. You may be screened starting at age 81 if you are at high risk.  Talk with your health care provider about your test results, treatment options, and if necessary, the need for more tests. Vaccines Your health care provider may recommend certain vaccines, such as:  Influenza vaccine. This is recommended every year.  Tetanus, diphtheria, and acellular pertussis (Tdap, Td) vaccine. You may need a Td booster every 10 years.  Varicella vaccine. You may need this if you have not been vaccinated.  Zoster vaccine. You may need this after age 28.  Measles, mumps, and rubella (MMR) vaccine. You may need at least one dose of MMR  if you were born in 1957 or later. You may also need a second dose.  Pneumococcal 13-valent conjugate (PCV13) vaccine. One dose is recommended after age 24.  Pneumococcal polysaccharide (PPSV23) vaccine. One dose is recommended after age 100.  Meningococcal vaccine. You may need this if you have certain conditions.  Hepatitis A vaccine. You may need this if you  have certain conditions or if you travel or work in places where you may be exposed to hepatitis A.  Hepatitis B vaccine. You may need this if you have certain conditions or if you travel or work in places where you may be exposed to hepatitis B.  Haemophilus influenzae type b (Hib) vaccine. You may need this if you have certain risk factors.  Talk to your health care provider about which screenings and vaccines you need and how often you need them. This information is not intended to replace advice given to you by your health care provider. Make sure you discuss any questions you have with your health care provider. Document Released: 07/16/2015 Document Revised: 03/08/2016 Document Reviewed: 04/20/2015 Elsevier Interactive Patient Education  2017 [employer].

## 2017-05-29 LAB — TSH: TSH: 3.7 u[IU]/mL (ref 0.35–4.50)

## 2017-05-29 LAB — HEMOGLOBIN A1C: Hgb A1c MFr Bld: 5.8 % (ref 4.6–6.5)

## 2017-05-29 LAB — COMPREHENSIVE METABOLIC PANEL
ALT: 5 U/L (ref 0–53)
AST: 9 U/L (ref 0–37)
Albumin: 3.6 g/dL (ref 3.5–5.2)
Alkaline Phosphatase: 49 U/L (ref 39–117)
BILIRUBIN TOTAL: 0.5 mg/dL (ref 0.2–1.2)
BUN: 17 mg/dL (ref 6–23)
CO2: 30 meq/L (ref 19–32)
Calcium: 9.1 mg/dL (ref 8.4–10.5)
Chloride: 101 mEq/L (ref 96–112)
Creatinine, Ser: 0.91 mg/dL (ref 0.40–1.50)
GFR: 83.24 mL/min (ref >60.00)
GLUCOSE: 118 mg/dL — AB (ref 70–99)
Potassium: 4.1 mEq/L (ref 3.5–5.1)
SODIUM: 138 meq/L (ref 135–145)
TOTAL PROTEIN: 6.9 g/dL (ref 6.0–8.3)

## 2017-05-29 LAB — LIPID PANEL
Cholesterol: 133 mg/dL (ref 0–200)
HDL: 39.6 mg/dL (ref >39.00)
LDL Cholesterol: 82 mg/dL (ref 0–99)
NonHDL: 93.2
Total CHOL/HDL Ratio: 3
Triglycerides: 56 mg/dL (ref 0.0–149.0)
VLDL: 11.2 mg/dL (ref 0.0–40.0)

## 2017-05-29 LAB — VITAMIN B12: VITAMIN B 12: 780 pg/mL (ref 211–911)

## 2017-05-29 LAB — CBC
HCT: 36 % — ABNORMAL LOW (ref 39.0–52.0)
HEMOGLOBIN: 11.8 g/dL — AB (ref 13.0–17.0)
MCHC: 32.9 g/dL (ref 30.0–36.0)
MCV: 89 fl (ref 78.0–100.0)
Platelets: 181 10*3/uL (ref 150.0–400.0)
RBC: 4.04 Mil/uL — ABNORMAL LOW (ref 4.22–5.81)
RDW: 14.5 % (ref 11.5–15.5)
WBC: 6.4 10*3/uL (ref 4.0–10.5)

## 2017-05-30 ENCOUNTER — Encounter: Payer: Self-pay | Admitting: Family Medicine

## 2017-05-30 DIAGNOSIS — Z0000 Encounter for general adult medical examination without abnormal findings: Secondary | ICD-10-CM

## 2017-05-30 DIAGNOSIS — B379 Candidiasis, unspecified: Secondary | ICD-10-CM | POA: Insufficient documentation

## 2017-05-30 HISTORY — DX: Encounter for general adult medical examination without abnormal findings: Z00.00

## 2017-05-30 NOTE — Assessment & Plan Note (Signed)
Patient encouraged to maintain heart healthy diet, regular exercise, adequate sleep. Consider daily probiotics. Take medications as prescribed 

## 2017-05-30 NOTE — Assessment & Plan Note (Signed)
Has a permanent indwelling foley cath and gets irritation of skin frequently. Will have home health monitor for a time and provide education to family

## 2017-05-30 NOTE — Assessment & Plan Note (Signed)
Has significant maceration and skin break down in groin and needs help with wound care from home health to monitor and adjust treatments. Is prescribed Diflucan to use weekly and Nystatin cream to use bid prn

## 2017-06-03 DIAGNOSIS — M1990 Unspecified osteoarthritis, unspecified site: Secondary | ICD-10-CM | POA: Diagnosis not present

## 2017-06-03 DIAGNOSIS — F329 Major depressive disorder, single episode, unspecified: Secondary | ICD-10-CM | POA: Diagnosis not present

## 2017-06-03 DIAGNOSIS — Z9350 Unspecified cystostomy status: Secondary | ICD-10-CM | POA: Diagnosis not present

## 2017-06-03 DIAGNOSIS — E538 Deficiency of other specified B group vitamins: Secondary | ICD-10-CM | POA: Diagnosis not present

## 2017-06-03 DIAGNOSIS — R739 Hyperglycemia, unspecified: Secondary | ICD-10-CM | POA: Diagnosis not present

## 2017-06-03 DIAGNOSIS — I509 Heart failure, unspecified: Secondary | ICD-10-CM | POA: Diagnosis not present

## 2017-06-03 DIAGNOSIS — E669 Obesity, unspecified: Secondary | ICD-10-CM | POA: Diagnosis not present

## 2017-06-03 DIAGNOSIS — E039 Hypothyroidism, unspecified: Secondary | ICD-10-CM | POA: Diagnosis not present

## 2017-06-03 DIAGNOSIS — B3749 Other urogenital candidiasis: Secondary | ICD-10-CM | POA: Diagnosis not present

## 2017-06-03 DIAGNOSIS — R339 Retention of urine, unspecified: Secondary | ICD-10-CM | POA: Diagnosis not present

## 2017-06-07 ENCOUNTER — Other Ambulatory Visit: Payer: Self-pay

## 2017-06-07 DIAGNOSIS — R339 Retention of urine, unspecified: Secondary | ICD-10-CM | POA: Diagnosis not present

## 2017-06-07 DIAGNOSIS — B3749 Other urogenital candidiasis: Secondary | ICD-10-CM | POA: Diagnosis not present

## 2017-06-07 DIAGNOSIS — F329 Major depressive disorder, single episode, unspecified: Secondary | ICD-10-CM | POA: Diagnosis not present

## 2017-06-07 DIAGNOSIS — M1990 Unspecified osteoarthritis, unspecified site: Secondary | ICD-10-CM | POA: Diagnosis not present

## 2017-06-07 DIAGNOSIS — N319 Neuromuscular dysfunction of bladder, unspecified: Secondary | ICD-10-CM | POA: Diagnosis not present

## 2017-06-07 DIAGNOSIS — E039 Hypothyroidism, unspecified: Secondary | ICD-10-CM | POA: Diagnosis not present

## 2017-06-07 DIAGNOSIS — I509 Heart failure, unspecified: Secondary | ICD-10-CM | POA: Diagnosis not present

## 2017-06-12 DIAGNOSIS — F329 Major depressive disorder, single episode, unspecified: Secondary | ICD-10-CM | POA: Diagnosis not present

## 2017-06-12 DIAGNOSIS — R339 Retention of urine, unspecified: Secondary | ICD-10-CM | POA: Diagnosis not present

## 2017-06-12 DIAGNOSIS — I509 Heart failure, unspecified: Secondary | ICD-10-CM | POA: Diagnosis not present

## 2017-06-12 DIAGNOSIS — M1990 Unspecified osteoarthritis, unspecified site: Secondary | ICD-10-CM | POA: Diagnosis not present

## 2017-06-12 DIAGNOSIS — B3749 Other urogenital candidiasis: Secondary | ICD-10-CM | POA: Diagnosis not present

## 2017-06-12 DIAGNOSIS — E039 Hypothyroidism, unspecified: Secondary | ICD-10-CM | POA: Diagnosis not present

## 2017-06-14 DIAGNOSIS — R339 Retention of urine, unspecified: Secondary | ICD-10-CM | POA: Diagnosis not present

## 2017-06-14 DIAGNOSIS — I509 Heart failure, unspecified: Secondary | ICD-10-CM | POA: Diagnosis not present

## 2017-06-14 DIAGNOSIS — B3749 Other urogenital candidiasis: Secondary | ICD-10-CM | POA: Diagnosis not present

## 2017-06-14 DIAGNOSIS — E039 Hypothyroidism, unspecified: Secondary | ICD-10-CM | POA: Diagnosis not present

## 2017-06-14 DIAGNOSIS — M1990 Unspecified osteoarthritis, unspecified site: Secondary | ICD-10-CM | POA: Diagnosis not present

## 2017-06-14 DIAGNOSIS — F329 Major depressive disorder, single episode, unspecified: Secondary | ICD-10-CM | POA: Diagnosis not present

## 2017-06-19 ENCOUNTER — Telehealth: Payer: Self-pay | Admitting: *Deleted

## 2017-06-19 NOTE — Telephone Encounter (Signed)
Received Home Health Certification and Plan of Care/Addendum to Plan of Tx; forwarded to provider/SLS 12/18

## 2017-06-20 DIAGNOSIS — M1990 Unspecified osteoarthritis, unspecified site: Secondary | ICD-10-CM | POA: Diagnosis not present

## 2017-06-20 DIAGNOSIS — R339 Retention of urine, unspecified: Secondary | ICD-10-CM | POA: Diagnosis not present

## 2017-06-20 DIAGNOSIS — B3749 Other urogenital candidiasis: Secondary | ICD-10-CM | POA: Diagnosis not present

## 2017-06-20 DIAGNOSIS — I509 Heart failure, unspecified: Secondary | ICD-10-CM | POA: Diagnosis not present

## 2017-06-20 DIAGNOSIS — F329 Major depressive disorder, single episode, unspecified: Secondary | ICD-10-CM | POA: Diagnosis not present

## 2017-06-20 DIAGNOSIS — E039 Hypothyroidism, unspecified: Secondary | ICD-10-CM | POA: Diagnosis not present

## 2017-06-22 ENCOUNTER — Telehealth: Payer: Self-pay | Admitting: Family Medicine

## 2017-06-22 NOTE — Telephone Encounter (Signed)
Copied from CRM [id number]. Topic: Quick Communication - Rx Refill/Question >> Jun 22, 2017  4:28 PM Clack, Princella PellegriniJessica D wrote: Has the patient contacted their pharmacy? No.   (Agent: If no, request that the patient contact the pharmacy for the refill.)   Preferred Pharmacy (with phone number or street name): [id number] - [place], [place] - [id number] [phone] (Phone) [phone] (Fax)  Pt wife Lucendia HerrlichFaye request a med refill on the pt methadone (DOLOPHINE) 5 MG tablet [[id number]][[id number]]    Agent: Please be advised that RX refills may take up to 3 business days. We ask that you follow-up with your pharmacy.

## 2017-06-22 NOTE — Telephone Encounter (Signed)
LOV 05/28/17 with Dr. Abner GreenspanBlyth / Medication refill for Methadone /

## 2017-06-25 ENCOUNTER — Other Ambulatory Visit: Payer: Self-pay | Admitting: Family Medicine

## 2017-06-25 DIAGNOSIS — E538 Deficiency of other specified B group vitamins: Secondary | ICD-10-CM

## 2017-06-25 DIAGNOSIS — F411 Generalized anxiety disorder: Secondary | ICD-10-CM

## 2017-06-25 MED ORDER — METHADONE HCL 5 MG PO TABS: 2.5000 mg | ORAL_TABLET | Freq: Three times a day (TID) | ORAL | 0 refills | Status: DC | PRN

## 2017-06-25 NOTE — Telephone Encounter (Signed)
DOD:  Requesting:methadone  Contract:yes UDS:low risk next screen 05/26/17 Last OV:05/28/17 Next OV:09/03/17 Last Refill:05/10/17   Please advise

## 2017-06-25 NOTE — Progress Notes (Signed)
Will refill methadone- used for pain control- in PCP absence.  Dr. Abner GreenspanBlyth was not able to Erx from home, although she did print the rx.  I sent rx via Er[facility] to [facility].  Called pt and alerted his wife   N[facility]: last filled on 11/13- 45 day supply.  No concerning entries.  Ok to refill

## 2017-06-25 NOTE — Telephone Encounter (Signed)
Contacted his PCP Dr. Abner GreenspanBlyth about this refill.  She will take care of this- if she is not able to erx from home she will let me know and I can take care of it

## 2017-06-26 DIAGNOSIS — R1312 Dysphagia, oropharyngeal phase: Secondary | ICD-10-CM | POA: Diagnosis not present

## 2017-06-26 DIAGNOSIS — G9340 Encephalopathy, unspecified: Secondary | ICD-10-CM | POA: Diagnosis not present

## 2017-06-26 DIAGNOSIS — R9402 Abnormal brain scan: Secondary | ICD-10-CM | POA: Diagnosis not present

## 2017-06-26 DIAGNOSIS — F0390 Unspecified dementia without behavioral disturbance: Secondary | ICD-10-CM | POA: Diagnosis present

## 2017-06-26 DIAGNOSIS — R509 Fever, unspecified: Secondary | ICD-10-CM | POA: Diagnosis not present

## 2017-06-26 DIAGNOSIS — Z8673 Personal history of transient ischemic attack (TIA), and cerebral infarction without residual deficits: Secondary | ICD-10-CM | POA: Diagnosis not present

## 2017-06-26 DIAGNOSIS — N319 Neuromuscular dysfunction of bladder, unspecified: Secondary | ICD-10-CM | POA: Diagnosis present

## 2017-06-26 DIAGNOSIS — Z87440 Personal history of urinary (tract) infections: Secondary | ICD-10-CM | POA: Diagnosis not present

## 2017-06-26 DIAGNOSIS — Z66 Do not resuscitate: Secondary | ICD-10-CM | POA: Diagnosis not present

## 2017-06-26 DIAGNOSIS — R918 Other nonspecific abnormal finding of lung field: Secondary | ICD-10-CM | POA: Diagnosis not present

## 2017-06-26 DIAGNOSIS — J181 Lobar pneumonia, unspecified organism: Secondary | ICD-10-CM | POA: Diagnosis not present

## 2017-06-26 DIAGNOSIS — T403X5A Adverse effect of methadone, initial encounter: Secondary | ICD-10-CM | POA: Diagnosis present

## 2017-06-26 DIAGNOSIS — J9811 Atelectasis: Secondary | ICD-10-CM | POA: Diagnosis not present

## 2017-06-26 DIAGNOSIS — Z2239 Carrier of other specified bacterial diseases: Secondary | ICD-10-CM | POA: Diagnosis not present

## 2017-06-26 DIAGNOSIS — Z79891 Long term (current) use of opiate analgesic: Secondary | ICD-10-CM | POA: Diagnosis not present

## 2017-06-26 DIAGNOSIS — Z960 Presence of urogenital implants: Secondary | ICD-10-CM | POA: Diagnosis not present

## 2017-06-26 DIAGNOSIS — Z7409 Other reduced mobility: Secondary | ICD-10-CM | POA: Diagnosis not present

## 2017-06-26 DIAGNOSIS — G92 Toxic encephalopathy: Secondary | ICD-10-CM | POA: Diagnosis not present

## 2017-06-26 DIAGNOSIS — E039 Hypothyroidism, unspecified: Secondary | ICD-10-CM | POA: Diagnosis present

## 2017-06-26 DIAGNOSIS — R5081 Fever presenting with conditions classified elsewhere: Secondary | ICD-10-CM | POA: Diagnosis not present

## 2017-06-26 DIAGNOSIS — B964 Proteus (mirabilis) (morganii) as the cause of diseases classified elsewhere: Secondary | ICD-10-CM | POA: Diagnosis not present

## 2017-06-26 DIAGNOSIS — J189 Pneumonia, unspecified organism: Secondary | ICD-10-CM | POA: Diagnosis not present

## 2017-06-26 DIAGNOSIS — R410 Disorientation, unspecified: Secondary | ICD-10-CM | POA: Diagnosis not present

## 2017-06-26 DIAGNOSIS — R4182 Altered mental status, unspecified: Secondary | ICD-10-CM | POA: Diagnosis not present

## 2017-06-26 DIAGNOSIS — T426X5A Adverse effect of other antiepileptic and sedative-hypnotic drugs, initial encounter: Secondary | ICD-10-CM | POA: Diagnosis present

## 2017-06-26 DIAGNOSIS — A4150 Gram-negative sepsis, unspecified: Secondary | ICD-10-CM | POA: Diagnosis not present

## 2017-06-26 DIAGNOSIS — N390 Urinary tract infection, site not specified: Secondary | ICD-10-CM | POA: Diagnosis not present

## 2017-06-26 DIAGNOSIS — T443X5A Adverse effect of other parasympatholytics [anticholinergics and antimuscarinics] and spasmolytics, initial encounter: Secondary | ICD-10-CM | POA: Diagnosis present

## 2017-06-26 DIAGNOSIS — R8271 Bacteriuria: Secondary | ICD-10-CM | POA: Diagnosis not present

## 2017-06-26 DIAGNOSIS — J690 Pneumonitis due to inhalation of food and vomit: Secondary | ICD-10-CM | POA: Diagnosis present

## 2017-06-30 DIAGNOSIS — B3749 Other urogenital candidiasis: Secondary | ICD-10-CM | POA: Diagnosis not present

## 2017-06-30 DIAGNOSIS — E039 Hypothyroidism, unspecified: Secondary | ICD-10-CM | POA: Diagnosis not present

## 2017-06-30 DIAGNOSIS — R339 Retention of urine, unspecified: Secondary | ICD-10-CM | POA: Diagnosis not present

## 2017-06-30 DIAGNOSIS — I509 Heart failure, unspecified: Secondary | ICD-10-CM | POA: Diagnosis not present

## 2017-06-30 DIAGNOSIS — F329 Major depressive disorder, single episode, unspecified: Secondary | ICD-10-CM | POA: Diagnosis not present

## 2017-06-30 DIAGNOSIS — M1990 Unspecified osteoarthritis, unspecified site: Secondary | ICD-10-CM | POA: Diagnosis not present

## 2017-07-02 DIAGNOSIS — E039 Hypothyroidism, unspecified: Secondary | ICD-10-CM | POA: Diagnosis not present

## 2017-07-02 DIAGNOSIS — I509 Heart failure, unspecified: Secondary | ICD-10-CM | POA: Diagnosis not present

## 2017-07-02 DIAGNOSIS — M1990 Unspecified osteoarthritis, unspecified site: Secondary | ICD-10-CM | POA: Diagnosis not present

## 2017-07-02 DIAGNOSIS — B3749 Other urogenital candidiasis: Secondary | ICD-10-CM | POA: Diagnosis not present

## 2017-07-02 DIAGNOSIS — R339 Retention of urine, unspecified: Secondary | ICD-10-CM | POA: Diagnosis not present

## 2017-07-02 DIAGNOSIS — F329 Major depressive disorder, single episode, unspecified: Secondary | ICD-10-CM | POA: Diagnosis not present

## 2017-07-05 ENCOUNTER — Telehealth: Payer: Self-pay | Admitting: *Deleted

## 2017-07-05 DIAGNOSIS — N319 Neuromuscular dysfunction of bladder, unspecified: Secondary | ICD-10-CM | POA: Diagnosis not present

## 2017-07-05 NOTE — Telephone Encounter (Signed)
Received Physician Orders from [facility]; forwarded to provider/SLS 01/03  

## 2017-07-06 DIAGNOSIS — E039 Hypothyroidism, unspecified: Secondary | ICD-10-CM | POA: Diagnosis not present

## 2017-07-06 DIAGNOSIS — J189 Pneumonia, unspecified organism: Secondary | ICD-10-CM | POA: Diagnosis not present

## 2017-07-06 DIAGNOSIS — R339 Retention of urine, unspecified: Secondary | ICD-10-CM | POA: Diagnosis not present

## 2017-07-06 DIAGNOSIS — M1990 Unspecified osteoarthritis, unspecified site: Secondary | ICD-10-CM | POA: Diagnosis not present

## 2017-07-06 DIAGNOSIS — F329 Major depressive disorder, single episode, unspecified: Secondary | ICD-10-CM | POA: Diagnosis not present

## 2017-07-06 DIAGNOSIS — E538 Deficiency of other specified B group vitamins: Secondary | ICD-10-CM | POA: Diagnosis not present

## 2017-07-06 DIAGNOSIS — I509 Heart failure, unspecified: Secondary | ICD-10-CM | POA: Diagnosis not present

## 2017-07-06 DIAGNOSIS — F0390 Unspecified dementia without behavioral disturbance: Secondary | ICD-10-CM | POA: Diagnosis not present

## 2017-07-06 DIAGNOSIS — E669 Obesity, unspecified: Secondary | ICD-10-CM | POA: Diagnosis not present

## 2017-07-09 DIAGNOSIS — R339 Retention of urine, unspecified: Secondary | ICD-10-CM | POA: Diagnosis not present

## 2017-07-09 DIAGNOSIS — F0390 Unspecified dementia without behavioral disturbance: Secondary | ICD-10-CM | POA: Diagnosis not present

## 2017-07-09 DIAGNOSIS — F329 Major depressive disorder, single episode, unspecified: Secondary | ICD-10-CM | POA: Diagnosis not present

## 2017-07-09 DIAGNOSIS — E039 Hypothyroidism, unspecified: Secondary | ICD-10-CM | POA: Diagnosis not present

## 2017-07-09 DIAGNOSIS — I509 Heart failure, unspecified: Secondary | ICD-10-CM | POA: Diagnosis not present

## 2017-07-09 DIAGNOSIS — J189 Pneumonia, unspecified organism: Secondary | ICD-10-CM | POA: Diagnosis not present

## 2017-07-09 DIAGNOSIS — E538 Deficiency of other specified B group vitamins: Secondary | ICD-10-CM | POA: Diagnosis not present

## 2017-07-09 DIAGNOSIS — E669 Obesity, unspecified: Secondary | ICD-10-CM | POA: Diagnosis not present

## 2017-07-09 DIAGNOSIS — M1990 Unspecified osteoarthritis, unspecified site: Secondary | ICD-10-CM | POA: Diagnosis not present

## 2017-07-12 ENCOUNTER — Encounter: Payer: Self-pay | Admitting: Family Medicine

## 2017-07-16 ENCOUNTER — Inpatient Hospital Stay: Payer: Medicare Other | Admitting: Family Medicine

## 2017-07-17 ENCOUNTER — Telehealth: Payer: Self-pay | Admitting: *Deleted

## 2017-07-17 DIAGNOSIS — E669 Obesity, unspecified: Secondary | ICD-10-CM | POA: Diagnosis not present

## 2017-07-17 DIAGNOSIS — E039 Hypothyroidism, unspecified: Secondary | ICD-10-CM | POA: Diagnosis not present

## 2017-07-17 DIAGNOSIS — E538 Deficiency of other specified B group vitamins: Secondary | ICD-10-CM | POA: Diagnosis not present

## 2017-07-17 DIAGNOSIS — J189 Pneumonia, unspecified organism: Secondary | ICD-10-CM | POA: Diagnosis not present

## 2017-07-17 DIAGNOSIS — I509 Heart failure, unspecified: Secondary | ICD-10-CM | POA: Diagnosis not present

## 2017-07-17 DIAGNOSIS — R339 Retention of urine, unspecified: Secondary | ICD-10-CM | POA: Diagnosis not present

## 2017-07-17 DIAGNOSIS — M1990 Unspecified osteoarthritis, unspecified site: Secondary | ICD-10-CM | POA: Diagnosis not present

## 2017-07-17 DIAGNOSIS — F329 Major depressive disorder, single episode, unspecified: Secondary | ICD-10-CM | POA: Diagnosis not present

## 2017-07-17 DIAGNOSIS — F0390 Unspecified dementia without behavioral disturbance: Secondary | ICD-10-CM | POA: Diagnosis not present

## 2017-07-17 NOTE — Telephone Encounter (Signed)
Received Physician Orders from [facility]; forwarded to provider/SLS 01/15  

## 2017-07-18 ENCOUNTER — Encounter: Payer: Self-pay | Admitting: Family Medicine

## 2017-07-19 ENCOUNTER — Encounter: Payer: Self-pay | Admitting: Family Medicine

## 2017-07-23 ENCOUNTER — Telehealth: Payer: Self-pay | Admitting: *Deleted

## 2017-07-23 NOTE — Telephone Encounter (Signed)
Received Home Health Certification and Plan of Care; forwarded to provider/SLS 01/21  

## 2017-07-23 NOTE — Telephone Encounter (Signed)
Received Outstanding Physician Orders from [facility]; forwarded to provider/SLS 01/21   

## 2017-07-27 DIAGNOSIS — R339 Retention of urine, unspecified: Secondary | ICD-10-CM | POA: Diagnosis not present

## 2017-07-27 DIAGNOSIS — E538 Deficiency of other specified B group vitamins: Secondary | ICD-10-CM | POA: Diagnosis not present

## 2017-07-27 DIAGNOSIS — M1990 Unspecified osteoarthritis, unspecified site: Secondary | ICD-10-CM | POA: Diagnosis not present

## 2017-07-27 DIAGNOSIS — F0390 Unspecified dementia without behavioral disturbance: Secondary | ICD-10-CM | POA: Diagnosis not present

## 2017-07-27 DIAGNOSIS — J189 Pneumonia, unspecified organism: Secondary | ICD-10-CM | POA: Diagnosis not present

## 2017-07-27 DIAGNOSIS — E669 Obesity, unspecified: Secondary | ICD-10-CM | POA: Diagnosis not present

## 2017-07-27 DIAGNOSIS — F329 Major depressive disorder, single episode, unspecified: Secondary | ICD-10-CM | POA: Diagnosis not present

## 2017-07-27 DIAGNOSIS — I509 Heart failure, unspecified: Secondary | ICD-10-CM | POA: Diagnosis not present

## 2017-07-27 DIAGNOSIS — E039 Hypothyroidism, unspecified: Secondary | ICD-10-CM | POA: Diagnosis not present

## 2017-07-30 DIAGNOSIS — E669 Obesity, unspecified: Secondary | ICD-10-CM | POA: Diagnosis not present

## 2017-07-30 DIAGNOSIS — F329 Major depressive disorder, single episode, unspecified: Secondary | ICD-10-CM | POA: Diagnosis not present

## 2017-07-30 DIAGNOSIS — M1990 Unspecified osteoarthritis, unspecified site: Secondary | ICD-10-CM | POA: Diagnosis not present

## 2017-07-30 DIAGNOSIS — F0390 Unspecified dementia without behavioral disturbance: Secondary | ICD-10-CM | POA: Diagnosis not present

## 2017-07-30 DIAGNOSIS — I509 Heart failure, unspecified: Secondary | ICD-10-CM | POA: Diagnosis not present

## 2017-07-30 DIAGNOSIS — J189 Pneumonia, unspecified organism: Secondary | ICD-10-CM | POA: Diagnosis not present

## 2017-07-30 DIAGNOSIS — E039 Hypothyroidism, unspecified: Secondary | ICD-10-CM | POA: Diagnosis not present

## 2017-07-30 DIAGNOSIS — E538 Deficiency of other specified B group vitamins: Secondary | ICD-10-CM | POA: Diagnosis not present

## 2017-07-30 DIAGNOSIS — R339 Retention of urine, unspecified: Secondary | ICD-10-CM | POA: Diagnosis not present

## 2017-08-02 DIAGNOSIS — N319 Neuromuscular dysfunction of bladder, unspecified: Secondary | ICD-10-CM | POA: Diagnosis not present

## 2017-08-13 ENCOUNTER — Ambulatory Visit (INDEPENDENT_AMBULATORY_CARE_PROVIDER_SITE_OTHER): Payer: Medicare HMO | Admitting: Family Medicine

## 2017-08-13 ENCOUNTER — Ambulatory Visit (HOSPITAL_BASED_OUTPATIENT_CLINIC_OR_DEPARTMENT_OTHER)
Admission: RE | Admit: 2017-08-13 | Discharge: 2017-08-13 | Disposition: A | Discharge status: Home / Self Care | Payer: Medicare HMO | Source: Ambulatory Visit | Attending: Family Medicine | Admitting: Family Medicine

## 2017-08-13 ENCOUNTER — Encounter: Payer: Self-pay | Admitting: Family Medicine

## 2017-08-13 DIAGNOSIS — J189 Pneumonia, unspecified organism: Secondary | ICD-10-CM | POA: Diagnosis not present

## 2017-08-13 DIAGNOSIS — M545 Low back pain: Secondary | ICD-10-CM | POA: Diagnosis not present

## 2017-08-13 DIAGNOSIS — G8929 Other chronic pain: Secondary | ICD-10-CM | POA: Diagnosis not present

## 2017-08-13 DIAGNOSIS — R739 Hyperglycemia, unspecified: Secondary | ICD-10-CM

## 2017-08-13 DIAGNOSIS — E039 Hypothyroidism, unspecified: Secondary | ICD-10-CM

## 2017-08-13 DIAGNOSIS — K449 Diaphragmatic hernia without obstruction or gangrene: Secondary | ICD-10-CM | POA: Insufficient documentation

## 2017-08-13 MED ORDER — NYSTATIN 100000 UNIT/GM EX CREA: 1.0000 "application " | TOPICAL_CREAM | Freq: Two times a day (BID) | CUTANEOUS | 0 refills | Status: AC

## 2017-08-13 MED ORDER — FLUCONAZOLE 150 MG PO TABS: ORAL_TABLET | ORAL | 3 refills | Status: DC

## 2017-08-13 NOTE — Assessment & Plan Note (Signed)
On Levothyroxine, continue to monitor 

## 2017-08-13 NOTE — Assessment & Plan Note (Signed)
hgba1c acceptable, minimize simple carbs. Increase exercise as tolerated.  

## 2017-08-13 NOTE — Progress Notes (Signed)
Subjective:  I acted as a Neurosurgeon for [employer] Fuller Song, RMA   Patient ID: Brandon Franklin, male    DOB: Apr 16, 1926, 84 y.o.   MRN: [id number]  Chief Complaint  Patient presents with  . Follow-up    HPI  Patient is in today for hospital follow up and he is accompanied by his wife and daughters. He feels better today. While hospitalized he was diagnosed with UTI, b/l pneumonia and sepsis but they note since returning home he has been much better. He has had some low grade 100 degree fevers a couple of times a week. Denies CP/palp/SOB/HA/congestion/GI or GU c/o. Taking meds as prescribed  Patient Care Team: Bradd Canary, MD as PCP - General (Family Medicine) Bebe Shaggy., MD (Urology)   Past Medical History:  Diagnosis Date  . 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 Allergy    seasonal  . Arthritis   . Cellulitis 09/20/2015  . CHF (congestive heart failure) (HCC)   . Complication of indwelling urinary catheter (HCC)    Suprapubic catheter in place s/p urologic surgery   . Depression   . Hyperglycemia 05/09/2015  . Low back pain 04/04/2015  . Neck fracture (HCC)   . Neuromuscular disorder (HCC)   . Obesity 04/04/2015  . Preventative health care 05/30/2017  . Urinary anomaly   . Urinary retention 09/21/2016  . Vitamin B12 deficiency 04/04/2015    Past Surgical History:  Procedure Laterality Date  . INSERTION OF SUPRAPUBIC CATHETER    . SURGERY SCROTAL / TESTICULAR Right     Family History  Problem Relation Age of Onset  . Cancer Sister     Social History   Socioeconomic History  . Marital status: Married    Spouse name: Not on file  . Number of children: Not on file  . Years of education: Not on file  . Highest education level: Not on file  Social Needs  . Financial resource strain: Not on file  . Food insecurity - worry: Not on file  . Food insecurity - inability: Not on file  . Transportation needs - medical: Not on file  . Transportation needs - non-medical:  Not on file  Occupational History  . Occupation: retired  Tobacco Use  . Smoking status: Never Smoker  . Smokeless tobacco: Never Used  Substance and Sexual Activity  . Alcohol use: No    Alcohol/week: 0.0 oz  . Drug use: No  . Sexual activity: No  Other Topics Concern  . Not on file  Social History Narrative  . Not on file    Outpatient Medications Prior to Visit  Medication Sig Dispense Refill  . bisacodyl (DULCOLAX) 5 MG EC tablet Take 5 mg by mouth daily as needed.    . cholecalciferol (VITAMIN D) 1000 UNITS tablet Take 1,000 Units by mouth daily.    . cyanocobalamin (,VITAMIN B-12,) 1000 MCG/ML injection Inject 1 mL (1,000 mcg total) into the muscle every 30 (thirty) days. 10 mL 1  . furosemide (LASIX) 40 MG tablet TAKE ONE TABLET BY MOUTH ONCE DAILY 90 tablet 1  . gabapentin (NEURONTIN) 300 MG capsule Take 1 capsule (300 mg total) 3 (three) times daily by mouth. 90 capsule 1  . levothyroxine (SYNTHROID, LEVOTHROID) 25 MCG tablet Take 1 tablet (25 mcg total) by mouth daily before breakfast. Ok to change to [employer] 90 tablet 1  . methadone (DOLOPHINE) 5 MG tablet Take 0.5 tablets (2.5 mg total) by mouth 3 (three) times daily as needed.  45 tablet 0  . oxybutynin (DITROPAN) 5 MG tablet Take 10 mg by mouth daily.     Marland Kitchen. senna-docusate (SENOKOT-S) 8.6-50 MG per tablet Take 4 tablets by mouth 2 (two) times daily. Only takes as needed.    . Syringe/Needle, Disp, (SYRINGE 3CC/25GX1") 25G X 1" 3 ML MISC To use with vitamin B12 50 each 1  . fluconazole (DIFLUCAN) 150 MG tablet Weekly prn rash 4 tablet 3  . nystatin cream (MYCOSTATIN) Apply 1 application topically 2 (two) times daily. 30 g 0   No facility-administered medications prior to visit.     Allergies  Allergen Reactions  . Ciprofloxacin   . Levaquin [Levofloxacin In D5w] Other (See Comments)  . Doxycycline Hyclate Rash    Review of Systems  Constitutional: Negative for fever and malaise/fatigue.  HENT: Negative for  congestion.   Eyes: Negative for blurred vision.  Respiratory: Negative for shortness of breath.   Cardiovascular: Negative for chest pain, palpitations and leg swelling.  Gastrointestinal: Negative for abdominal pain, blood in stool and nausea.  Genitourinary: Negative for dysuria and frequency.  Musculoskeletal: Positive for back pain. Negative for falls.  Skin: Negative for rash.  Neurological: Negative for dizziness, loss of consciousness and headaches.  Endo/Heme/Allergies: Negative for environmental allergies.  Psychiatric/Behavioral: Negative for depression. The patient is not nervous/anxious.        Objective:    Physical Exam  Constitutional: He is oriented to person, place, and time. He appears well-developed and well-nourished. No distress.  HENT:  Head: Normocephalic and atraumatic.  Nose: Nose normal.  Eyes: Right eye exhibits no discharge. Left eye exhibits no discharge.  Neck: Normal range of motion. Neck supple.  Cardiovascular: Normal rate and regular rhythm.  No murmur heard. Pulmonary/Chest: Effort normal and breath sounds normal.  Abdominal: Soft. Bowel sounds are normal. There is no tenderness.  Musculoskeletal: He exhibits no edema.  Neurological: He is alert and oriented to person, place, and time.  Skin: Skin is warm and dry.  Psychiatric: He has a normal mood and affect.  Nursing note and vitals reviewed.   BP (!) 81/52 (BP Location: Left Arm, Patient Position: Sitting, Cuff Size: Large)   Pulse 65   Temp 97.7 F (36.5 C) (Oral)   Resp 14   Ht 5' 10.87" (1.8 m)   Wt 246 lb (111.6 kg)   SpO2 (!) 83%   BMI 34.44 kg/m  Wt Readings from Last 3 Encounters:  08/13/17 246 lb (111.6 kg)  05/28/17 246 lb (111.6 kg)  09/21/16 246 lb (111.6 kg)   BP Readings from Last 3 Encounters:  08/13/17 (!) 81/52  05/28/17 (!) 94/56  09/21/16 126/65     Immunization History  Administered Date(s) Administered  . Influenza, High Dose Seasonal PF 03/23/2016,  05/28/2017  . Influenza,inj,Quad PF,6+ Mos 04/27/2015  . Pneumococcal Conjugate-13 09/13/2015    Health Maintenance  Topic Date Due  . TETANUS/TDAP  08/30/1946  . PNA vac Low Risk Adult (2 of 2 - PPSV23) 09/12/2016  . INFLUENZA VACCINE  Completed    Lab Results  Component Value Date   WBC 6.4 05/28/2017   HGB 11.8 (L) 05/28/2017   HCT 36.0 (L) 05/28/2017   PLT 181.0 05/28/2017   GLUCOSE 118 (H) 05/28/2017   CHOL 133 05/28/2017   TRIG 56.0 05/28/2017   HDL 39.60 05/28/2017   LDLCALC 82 05/28/2017   ALT 5 05/28/2017   AST 9 05/28/2017   NA 138 05/28/2017   K 4.1 05/28/2017   CL  101 05/28/2017   CREATININE 0.91 05/28/2017   BUN 17 05/28/2017   CO2 30 05/28/2017   TSH 3.70 05/28/2017   INR 1.1 05/17/2008   HGBA1C 5.8 05/28/2017    Lab Results  Component Value Date   TSH 3.70 05/28/2017   Lab Results  Component Value Date   WBC 6.4 05/28/2017   HGB 11.8 (L) 05/28/2017   HCT 36.0 (L) 05/28/2017   MCV 89.0 05/28/2017   PLT 181.0 05/28/2017   Lab Results  Component Value Date   NA 138 05/28/2017   K 4.1 05/28/2017   CO2 30 05/28/2017   GLUCOSE 118 (H) 05/28/2017   BUN 17 05/28/2017   CREATININE 0.91 05/28/2017   BILITOT 0.5 05/28/2017   ALKPHOS 49 05/28/2017   AST 9 05/28/2017   ALT 5 05/28/2017   PROT 6.9 05/28/2017   ALBUMIN 3.6 05/28/2017   CALCIUM 9.1 05/28/2017   GFR 83.24 05/28/2017   Lab Results  Component Value Date   CHOL 133 05/28/2017   Lab Results  Component Value Date   HDL 39.60 05/28/2017   Lab Results  Component Value Date   LDLCALC 82 05/28/2017   Lab Results  Component Value Date   TRIG 56.0 05/28/2017   Lab Results  Component Value Date   CHOLHDL 3 05/28/2017   Lab Results  Component Value Date   HGBA1C 5.8 05/28/2017         Assessment & Plan:   Problem List Items Addressed This Visit    Low back pain    Allowed refill on pain meds to use sparingly      Hyperglycemia    hgba1c acceptable, minimize simple  carbs. Increase exercise as tolerated.      Hypothyroid    On Levothyroxine, continue to monitor      Pneumonia    Repeat CXR today, symptomatically much better but shows some possible residual infection in right lung with low grade fevers so will try a course of Augmentin XR 875 mg tabs, 1 tab po bid      Relevant Medications   fluconazole (DIFLUCAN) 150 MG tablet   Other Relevant Orders   DG Chest 2 View (Completed)      I am having Nathanial Rancher maintain his bisacodyl, cholecalciferol, oxybutynin, senna-docusate, SYRINGE 3CC/25GX1", cyanocobalamin, furosemide, levothyroxine, gabapentin, methadone, nystatin cream, and fluconazole.  Meds ordered this encounter  Medications  . nystatin cream (MYCOSTATIN)    Sig: Apply 1 application topically 2 (two) times daily.    Dispense:  30 g    Refill:  0  . fluconazole (DIFLUCAN) 150 MG tablet    Sig: Weekly prn rash    Dispense:  4 tablet    Refill:  3    CMA served as scribe during this visit. History, Physical and Plan performed by medical provider. Documentation and orders reviewed and attested to.  Danise Edge, MD

## 2017-08-13 NOTE — Assessment & Plan Note (Signed)
Allowed refill on pain meds to use sparingly

## 2017-08-13 NOTE — Patient Instructions (Signed)

## 2017-08-13 NOTE — Assessment & Plan Note (Addendum)
Repeat CXR today, symptomatically much better but shows some possible residual infection in right lung with low grade fevers so will try a course of Augmentin XR 875 mg tabs, 1 tab po bid

## 2017-08-14 ENCOUNTER — Encounter: Payer: Self-pay | Admitting: Family Medicine

## 2017-08-14 ENCOUNTER — Other Ambulatory Visit: Payer: Self-pay

## 2017-08-14 MED ORDER — AMOXICILLIN-POT CLAVULANATE 875-125 MG PO TABS: 1.0000 | ORAL_TABLET | Freq: Two times a day (BID) | ORAL | 0 refills | Status: DC

## 2017-08-14 NOTE — Telephone Encounter (Signed)
Prescription for Augmentin has been sent in to pharmacy for patient as advised in note.

## 2017-08-15 ENCOUNTER — Encounter: Payer: Self-pay | Admitting: Family Medicine

## 2017-08-15 ENCOUNTER — Other Ambulatory Visit: Payer: Self-pay | Admitting: Family Medicine

## 2017-08-15 DIAGNOSIS — F411 Generalized anxiety disorder: Secondary | ICD-10-CM

## 2017-08-15 DIAGNOSIS — E538 Deficiency of other specified B group vitamins: Secondary | ICD-10-CM

## 2017-08-15 MED ORDER — METHADONE HCL 5 MG PO TABS: 2.5000 mg | ORAL_TABLET | Freq: Three times a day (TID) | ORAL | 0 refills | Status: DC | PRN

## 2017-08-17 ENCOUNTER — Other Ambulatory Visit: Payer: Self-pay | Admitting: Family Medicine

## 2017-08-28 ENCOUNTER — Other Ambulatory Visit: Payer: Self-pay | Admitting: Family Medicine

## 2017-08-28 ENCOUNTER — Telehealth: Payer: Self-pay | Admitting: Family Medicine

## 2017-08-28 NOTE — Telephone Encounter (Signed)
Copied from CRM [id number]. Topic: Quick Communication - Rx Refill/Question >> Aug 28, 2017 11:26 AM [name], [name] wrote: Medication: cyanocobalamin (,VITAMIN B-12,) 1000 MCG/ML injection Has the patient contacted their pharmacy? Yes.   (Agent: If no, request that the patient contact the pharmacy for the refill.) Preferred Pharmacy (with phone number or street name): [facility] - [place] - [id number] [phone] (Phone) [phone] (Fax)   Agent: Please be advised that RX refills may take up to 3 business days. We ask that you follow-up with your pharmacy.

## 2017-08-29 MED ORDER — CYANOCOBALAMIN 1000 MCG/ML IJ SOLN: 1000.0000 ug | INTRAMUSCULAR | 1 refills | Status: AC

## 2017-08-29 NOTE — Telephone Encounter (Signed)
Refilled Vitamin B12 injection. LOV 08/13/17 NOV 11/05/17 Provider Dr Danise EdgeStacey Franklin

## 2017-08-29 NOTE — Telephone Encounter (Signed)
Noted  

## 2017-09-03 ENCOUNTER — Ambulatory Visit: Payer: Medicare Other | Admitting: Family Medicine

## 2017-09-06 DIAGNOSIS — N319 Neuromuscular dysfunction of bladder, unspecified: Secondary | ICD-10-CM | POA: Diagnosis not present

## 2017-09-17 DIAGNOSIS — R2689 Other abnormalities of gait and mobility: Secondary | ICD-10-CM | POA: Diagnosis not present

## 2017-09-17 DIAGNOSIS — B9620 Unspecified Escherichia coli [E. coli] as the cause of diseases classified elsewhere: Secondary | ICD-10-CM | POA: Diagnosis not present

## 2017-09-17 DIAGNOSIS — F0390 Unspecified dementia without behavioral disturbance: Secondary | ICD-10-CM | POA: Diagnosis not present

## 2017-09-17 DIAGNOSIS — M5031 Other cervical disc degeneration,  high cervical region: Secondary | ICD-10-CM | POA: Diagnosis not present

## 2017-09-17 DIAGNOSIS — Y846 Urinary catheterization as the cause of abnormal reaction of the patient, or of later complication, without mention of misadventure at the time of the procedure: Secondary | ICD-10-CM | POA: Diagnosis not present

## 2017-09-17 DIAGNOSIS — T83511A Infection and inflammatory reaction due to indwelling urethral catheter, initial encounter: Secondary | ICD-10-CM | POA: Diagnosis not present

## 2017-09-17 DIAGNOSIS — G9341 Metabolic encephalopathy: Secondary | ICD-10-CM | POA: Diagnosis not present

## 2017-09-17 DIAGNOSIS — G8929 Other chronic pain: Secondary | ICD-10-CM | POA: Diagnosis not present

## 2017-09-17 DIAGNOSIS — F05 Delirium due to known physiological condition: Secondary | ICD-10-CM | POA: Diagnosis not present

## 2017-09-17 DIAGNOSIS — B964 Proteus (mirabilis) (morganii) as the cause of diseases classified elsewhere: Secondary | ICD-10-CM | POA: Diagnosis not present

## 2017-09-17 DIAGNOSIS — Z960 Presence of urogenital implants: Secondary | ICD-10-CM | POA: Diagnosis not present

## 2017-09-17 DIAGNOSIS — Z7409 Other reduced mobility: Secondary | ICD-10-CM | POA: Diagnosis not present

## 2017-09-17 DIAGNOSIS — R410 Disorientation, unspecified: Secondary | ICD-10-CM | POA: Diagnosis not present

## 2017-09-17 DIAGNOSIS — N319 Neuromuscular dysfunction of bladder, unspecified: Secondary | ICD-10-CM | POA: Diagnosis not present

## 2017-09-17 DIAGNOSIS — M542 Cervicalgia: Secondary | ICD-10-CM | POA: Diagnosis not present

## 2017-09-17 DIAGNOSIS — G9340 Encephalopathy, unspecified: Secondary | ICD-10-CM | POA: Diagnosis not present

## 2017-09-17 DIAGNOSIS — R5381 Other malaise: Secondary | ICD-10-CM | POA: Diagnosis not present

## 2017-09-17 DIAGNOSIS — R030 Elevated blood-pressure reading, without diagnosis of hypertension: Secondary | ICD-10-CM | POA: Diagnosis not present

## 2017-09-17 DIAGNOSIS — N390 Urinary tract infection, site not specified: Secondary | ICD-10-CM | POA: Diagnosis not present

## 2017-09-17 DIAGNOSIS — M50323 Other cervical disc degeneration at C6-C7 level: Secondary | ICD-10-CM | POA: Diagnosis not present

## 2017-09-24 ENCOUNTER — Telehealth: Payer: Self-pay | Admitting: Family Medicine

## 2017-09-24 DIAGNOSIS — B9620 Unspecified Escherichia coli [E. coli] as the cause of diseases classified elsewhere: Secondary | ICD-10-CM | POA: Diagnosis not present

## 2017-09-24 DIAGNOSIS — M5031 Other cervical disc degeneration,  high cervical region: Secondary | ICD-10-CM | POA: Diagnosis not present

## 2017-09-24 DIAGNOSIS — B964 Proteus (mirabilis) (morganii) as the cause of diseases classified elsewhere: Secondary | ICD-10-CM | POA: Diagnosis not present

## 2017-09-24 DIAGNOSIS — T83511D Infection and inflammatory reaction due to indwelling urethral catheter, subsequent encounter: Secondary | ICD-10-CM | POA: Diagnosis not present

## 2017-09-24 DIAGNOSIS — N390 Urinary tract infection, site not specified: Secondary | ICD-10-CM | POA: Diagnosis not present

## 2017-09-24 DIAGNOSIS — F0391 Unspecified dementia with behavioral disturbance: Secondary | ICD-10-CM | POA: Diagnosis not present

## 2017-09-24 NOTE — Telephone Encounter (Signed)
Noted  

## 2017-09-24 NOTE — Telephone Encounter (Signed)
Copied from CRM #[id number]. Topic: Inquiry >> Sep 24, 2017  4:10 PM Everardo PacificMoton, Veeda Virgo, [place] wrote: Reason for CRM: Herbert SetaHeather is calling because she would like to let Dr.[name] know that the patient has an order for  home health for nursing and OT and also has an order for PT as well. If anyone has any further questions she can be reached at [phone]

## 2017-09-25 ENCOUNTER — Telehealth: Payer: Self-pay | Admitting: Family Medicine

## 2017-09-25 ENCOUNTER — Encounter: Payer: Self-pay | Admitting: Family Medicine

## 2017-09-25 ENCOUNTER — Other Ambulatory Visit: Payer: Self-pay | Admitting: Family Medicine

## 2017-09-25 DIAGNOSIS — T83511D Infection and inflammatory reaction due to indwelling urethral catheter, subsequent encounter: Secondary | ICD-10-CM | POA: Diagnosis not present

## 2017-09-25 DIAGNOSIS — B964 Proteus (mirabilis) (morganii) as the cause of diseases classified elsewhere: Secondary | ICD-10-CM | POA: Diagnosis not present

## 2017-09-25 DIAGNOSIS — B9620 Unspecified Escherichia coli [E. coli] as the cause of diseases classified elsewhere: Secondary | ICD-10-CM | POA: Diagnosis not present

## 2017-09-25 DIAGNOSIS — F0391 Unspecified dementia with behavioral disturbance: Secondary | ICD-10-CM | POA: Diagnosis not present

## 2017-09-25 DIAGNOSIS — N390 Urinary tract infection, site not specified: Secondary | ICD-10-CM | POA: Diagnosis not present

## 2017-09-25 DIAGNOSIS — M5031 Other cervical disc degeneration,  high cervical region: Secondary | ICD-10-CM | POA: Diagnosis not present

## 2017-09-25 NOTE — Telephone Encounter (Signed)
Copied from CRM [id number]. Topic: Quick Communication - See Telephone Encounter >> Sep 25, 2017 12:40 PM Guinevere FerrariMorris, Shera Laubach E, NT wrote: CRM for notification. See Telephone encounter for: 09/25/17. Debroah Looprnold is calling to get verbal orders for PT for 2 times a week for 4 weeks and also requesting a prescription for a half length bed rail sent to [facility]. He would like a call back.

## 2017-09-26 DIAGNOSIS — T83511D Infection and inflammatory reaction due to indwelling urethral catheter, subsequent encounter: Secondary | ICD-10-CM | POA: Diagnosis not present

## 2017-09-26 DIAGNOSIS — B964 Proteus (mirabilis) (morganii) as the cause of diseases classified elsewhere: Secondary | ICD-10-CM | POA: Diagnosis not present

## 2017-09-26 DIAGNOSIS — M5031 Other cervical disc degeneration,  high cervical region: Secondary | ICD-10-CM | POA: Diagnosis not present

## 2017-09-26 DIAGNOSIS — F0391 Unspecified dementia with behavioral disturbance: Secondary | ICD-10-CM | POA: Diagnosis not present

## 2017-09-26 DIAGNOSIS — N390 Urinary tract infection, site not specified: Secondary | ICD-10-CM | POA: Diagnosis not present

## 2017-09-26 DIAGNOSIS — B9620 Unspecified Escherichia coli [E. coli] as the cause of diseases classified elsewhere: Secondary | ICD-10-CM | POA: Diagnosis not present

## 2017-09-26 NOTE — Telephone Encounter (Signed)
Called pt's daughter, Arline AspCindy, and let her know that Dr. Abner GreenspanBlyth is completely booked that day. Looked around on the schedule and noted that both husband and wife are scheduled to be seen on May 5th. Arline AspCindy agreed that that would be fine and I extended Yuan's appt to 30 for discussion about medication they received from the hospital.

## 2017-09-27 DIAGNOSIS — F0391 Unspecified dementia with behavioral disturbance: Secondary | ICD-10-CM | POA: Diagnosis not present

## 2017-09-27 DIAGNOSIS — N390 Urinary tract infection, site not specified: Secondary | ICD-10-CM | POA: Diagnosis not present

## 2017-09-27 DIAGNOSIS — T83511D Infection and inflammatory reaction due to indwelling urethral catheter, subsequent encounter: Secondary | ICD-10-CM | POA: Diagnosis not present

## 2017-09-27 DIAGNOSIS — B9620 Unspecified Escherichia coli [E. coli] as the cause of diseases classified elsewhere: Secondary | ICD-10-CM | POA: Diagnosis not present

## 2017-09-27 DIAGNOSIS — B964 Proteus (mirabilis) (morganii) as the cause of diseases classified elsewhere: Secondary | ICD-10-CM | POA: Diagnosis not present

## 2017-09-27 DIAGNOSIS — M5031 Other cervical disc degeneration,  high cervical region: Secondary | ICD-10-CM | POA: Diagnosis not present

## 2017-09-27 NOTE — Telephone Encounter (Signed)
Copied from CRM [id number]. Topic: Inquiry >> Sep 26, 2017  1:33 PM [name], [name] wrote: Reason for CRM: Victorino DikeJennifer with [facility] [phone]) called requesting verbal orders for OT. OT Orders are OT for 2 times a week for 2 weeks (starting next week), 1 time the last week. Please call Victorino DikeJennifer with the approvals.       Thank You!!!     Verbal orders given

## 2017-09-28 DIAGNOSIS — B9620 Unspecified Escherichia coli [E. coli] as the cause of diseases classified elsewhere: Secondary | ICD-10-CM | POA: Diagnosis not present

## 2017-09-28 DIAGNOSIS — N390 Urinary tract infection, site not specified: Secondary | ICD-10-CM | POA: Diagnosis not present

## 2017-09-28 DIAGNOSIS — M5031 Other cervical disc degeneration,  high cervical region: Secondary | ICD-10-CM | POA: Diagnosis not present

## 2017-09-28 DIAGNOSIS — F0391 Unspecified dementia with behavioral disturbance: Secondary | ICD-10-CM | POA: Diagnosis not present

## 2017-09-28 DIAGNOSIS — B964 Proteus (mirabilis) (morganii) as the cause of diseases classified elsewhere: Secondary | ICD-10-CM | POA: Diagnosis not present

## 2017-09-28 DIAGNOSIS — T83511D Infection and inflammatory reaction due to indwelling urethral catheter, subsequent encounter: Secondary | ICD-10-CM | POA: Diagnosis not present

## 2017-10-01 ENCOUNTER — Other Ambulatory Visit: Payer: Self-pay

## 2017-10-01 DIAGNOSIS — F0391 Unspecified dementia with behavioral disturbance: Secondary | ICD-10-CM | POA: Diagnosis not present

## 2017-10-01 DIAGNOSIS — M5031 Other cervical disc degeneration,  high cervical region: Secondary | ICD-10-CM | POA: Diagnosis not present

## 2017-10-01 DIAGNOSIS — B964 Proteus (mirabilis) (morganii) as the cause of diseases classified elsewhere: Secondary | ICD-10-CM | POA: Diagnosis not present

## 2017-10-01 DIAGNOSIS — N390 Urinary tract infection, site not specified: Secondary | ICD-10-CM | POA: Diagnosis not present

## 2017-10-01 DIAGNOSIS — T83511D Infection and inflammatory reaction due to indwelling urethral catheter, subsequent encounter: Secondary | ICD-10-CM | POA: Diagnosis not present

## 2017-10-01 DIAGNOSIS — B9620 Unspecified Escherichia coli [E. coli] as the cause of diseases classified elsewhere: Secondary | ICD-10-CM | POA: Diagnosis not present

## 2017-10-01 NOTE — Telephone Encounter (Signed)
Copied from CRM [phone]. Topic: Quick Communication - Rx Refill/Question >> Aug 28, 2017 11:26 AM [name], [name] wrote: Medication: cyanocobalamin (,VITAMIN B-12,) 1000 MCG/ML injection Has the patient contacted their pharmacy? Yes.   (Agent: If no, request that the patient contact the pharmacy for the refill.) Preferred Pharmacy (with phone number or street name): [facility] - [place], [place] - [id number] [phone]) [phone])   Agent: Please be advised that RX refills may take up to 3 business days. We ask that you follow-up with your pharmacy.

## 2017-10-03 DIAGNOSIS — T83511D Infection and inflammatory reaction due to indwelling urethral catheter, subsequent encounter: Secondary | ICD-10-CM | POA: Diagnosis not present

## 2017-10-03 DIAGNOSIS — M5031 Other cervical disc degeneration,  high cervical region: Secondary | ICD-10-CM | POA: Diagnosis not present

## 2017-10-03 DIAGNOSIS — B9620 Unspecified Escherichia coli [E. coli] as the cause of diseases classified elsewhere: Secondary | ICD-10-CM | POA: Diagnosis not present

## 2017-10-03 DIAGNOSIS — B964 Proteus (mirabilis) (morganii) as the cause of diseases classified elsewhere: Secondary | ICD-10-CM | POA: Diagnosis not present

## 2017-10-03 DIAGNOSIS — F0391 Unspecified dementia with behavioral disturbance: Secondary | ICD-10-CM | POA: Diagnosis not present

## 2017-10-03 DIAGNOSIS — N390 Urinary tract infection, site not specified: Secondary | ICD-10-CM | POA: Diagnosis not present

## 2017-10-04 ENCOUNTER — Telehealth: Payer: Self-pay | Admitting: Family Medicine

## 2017-10-04 DIAGNOSIS — T83511D Infection and inflammatory reaction due to indwelling urethral catheter, subsequent encounter: Secondary | ICD-10-CM | POA: Diagnosis not present

## 2017-10-04 DIAGNOSIS — N390 Urinary tract infection, site not specified: Secondary | ICD-10-CM | POA: Diagnosis not present

## 2017-10-04 DIAGNOSIS — N319 Neuromuscular dysfunction of bladder, unspecified: Secondary | ICD-10-CM | POA: Diagnosis not present

## 2017-10-04 DIAGNOSIS — B9620 Unspecified Escherichia coli [E. coli] as the cause of diseases classified elsewhere: Secondary | ICD-10-CM | POA: Diagnosis not present

## 2017-10-04 DIAGNOSIS — B964 Proteus (mirabilis) (morganii) as the cause of diseases classified elsewhere: Secondary | ICD-10-CM | POA: Diagnosis not present

## 2017-10-04 DIAGNOSIS — F0391 Unspecified dementia with behavioral disturbance: Secondary | ICD-10-CM | POA: Diagnosis not present

## 2017-10-04 DIAGNOSIS — M5031 Other cervical disc degeneration,  high cervical region: Secondary | ICD-10-CM | POA: Diagnosis not present

## 2017-10-04 NOTE — Telephone Encounter (Signed)
Copied from CRM [id number]. Topic: Inquiry >> Oct 04, 2017  3:06 PM Crist InfanteHarrald, Kathy J wrote: Reason for CRM: Debroah LoopArnold with [facility] calling to ask if we have received or sent the order for pt  [facility]? Debroah Looprnold states he faxed on 3/29.  [facility] states they have not gotten either. Debroah Looprnold will refax.

## 2017-10-05 DIAGNOSIS — B9620 Unspecified Escherichia coli [E. coli] as the cause of diseases classified elsewhere: Secondary | ICD-10-CM | POA: Diagnosis not present

## 2017-10-05 DIAGNOSIS — M5031 Other cervical disc degeneration,  high cervical region: Secondary | ICD-10-CM | POA: Diagnosis not present

## 2017-10-05 DIAGNOSIS — N390 Urinary tract infection, site not specified: Secondary | ICD-10-CM | POA: Diagnosis not present

## 2017-10-05 DIAGNOSIS — B964 Proteus (mirabilis) (morganii) as the cause of diseases classified elsewhere: Secondary | ICD-10-CM | POA: Diagnosis not present

## 2017-10-05 DIAGNOSIS — T83511D Infection and inflammatory reaction due to indwelling urethral catheter, subsequent encounter: Secondary | ICD-10-CM | POA: Diagnosis not present

## 2017-10-05 DIAGNOSIS — F0391 Unspecified dementia with behavioral disturbance: Secondary | ICD-10-CM | POA: Diagnosis not present

## 2017-10-08 ENCOUNTER — Encounter: Payer: Self-pay | Admitting: Family Medicine

## 2017-10-08 DIAGNOSIS — F0391 Unspecified dementia with behavioral disturbance: Secondary | ICD-10-CM | POA: Diagnosis not present

## 2017-10-08 DIAGNOSIS — M5031 Other cervical disc degeneration,  high cervical region: Secondary | ICD-10-CM | POA: Diagnosis not present

## 2017-10-08 DIAGNOSIS — T83511D Infection and inflammatory reaction due to indwelling urethral catheter, subsequent encounter: Secondary | ICD-10-CM | POA: Diagnosis not present

## 2017-10-08 DIAGNOSIS — N390 Urinary tract infection, site not specified: Secondary | ICD-10-CM | POA: Diagnosis not present

## 2017-10-08 DIAGNOSIS — B964 Proteus (mirabilis) (morganii) as the cause of diseases classified elsewhere: Secondary | ICD-10-CM | POA: Diagnosis not present

## 2017-10-08 DIAGNOSIS — B9620 Unspecified Escherichia coli [E. coli] as the cause of diseases classified elsewhere: Secondary | ICD-10-CM | POA: Diagnosis not present

## 2017-10-09 ENCOUNTER — Telehealth: Payer: Self-pay | Admitting: *Deleted

## 2017-10-09 DIAGNOSIS — G9341 Metabolic encephalopathy: Secondary | ICD-10-CM

## 2017-10-09 DIAGNOSIS — F0390 Unspecified dementia without behavioral disturbance: Secondary | ICD-10-CM

## 2017-10-09 DIAGNOSIS — F05 Delirium due to known physiological condition: Secondary | ICD-10-CM

## 2017-10-09 DIAGNOSIS — M542 Cervicalgia: Secondary | ICD-10-CM

## 2017-10-09 DIAGNOSIS — G8929 Other chronic pain: Secondary | ICD-10-CM

## 2017-10-09 DIAGNOSIS — M5030 Other cervical disc degeneration, unspecified cervical region: Secondary | ICD-10-CM

## 2017-10-09 DIAGNOSIS — N319 Neuromuscular dysfunction of bladder, unspecified: Secondary | ICD-10-CM

## 2017-10-09 DIAGNOSIS — G894 Chronic pain syndrome: Secondary | ICD-10-CM

## 2017-10-09 NOTE — Telephone Encounter (Signed)
Received request for DME Rails Bedside Half Length and to [facility]; forwarded to provider/SLS 04/09

## 2017-10-09 NOTE — Telephone Encounter (Signed)
Orders given.  

## 2017-10-09 NOTE — Telephone Encounter (Signed)
Received Home Health Certification and Plan of Care; forwarded to provider/SLS 04/09  

## 2017-10-09 NOTE — Telephone Encounter (Signed)
Order with printed Rx for bed rails [facility] at [phone], also copy to [place]/SLS 04/09

## 2017-10-10 DIAGNOSIS — B964 Proteus (mirabilis) (morganii) as the cause of diseases classified elsewhere: Secondary | ICD-10-CM | POA: Diagnosis not present

## 2017-10-10 DIAGNOSIS — N390 Urinary tract infection, site not specified: Secondary | ICD-10-CM | POA: Diagnosis not present

## 2017-10-10 DIAGNOSIS — T83511D Infection and inflammatory reaction due to indwelling urethral catheter, subsequent encounter: Secondary | ICD-10-CM | POA: Diagnosis not present

## 2017-10-10 DIAGNOSIS — B9620 Unspecified Escherichia coli [E. coli] as the cause of diseases classified elsewhere: Secondary | ICD-10-CM | POA: Diagnosis not present

## 2017-10-10 DIAGNOSIS — F0391 Unspecified dementia with behavioral disturbance: Secondary | ICD-10-CM | POA: Diagnosis not present

## 2017-10-10 DIAGNOSIS — M5031 Other cervical disc degeneration,  high cervical region: Secondary | ICD-10-CM | POA: Diagnosis not present

## 2017-10-10 MED ORDER — LEVOTHYROXINE SODIUM 25 MCG PO TABS: 25.0000 ug | ORAL_TABLET | Freq: Every day | ORAL | 1 refills | Status: DC

## 2017-10-11 DIAGNOSIS — B9620 Unspecified Escherichia coli [E. coli] as the cause of diseases classified elsewhere: Secondary | ICD-10-CM | POA: Diagnosis not present

## 2017-10-11 DIAGNOSIS — T83511D Infection and inflammatory reaction due to indwelling urethral catheter, subsequent encounter: Secondary | ICD-10-CM | POA: Diagnosis not present

## 2017-10-11 DIAGNOSIS — N390 Urinary tract infection, site not specified: Secondary | ICD-10-CM | POA: Diagnosis not present

## 2017-10-11 DIAGNOSIS — B964 Proteus (mirabilis) (morganii) as the cause of diseases classified elsewhere: Secondary | ICD-10-CM | POA: Diagnosis not present

## 2017-10-11 DIAGNOSIS — F0391 Unspecified dementia with behavioral disturbance: Secondary | ICD-10-CM | POA: Diagnosis not present

## 2017-10-11 DIAGNOSIS — M5031 Other cervical disc degeneration,  high cervical region: Secondary | ICD-10-CM | POA: Diagnosis not present

## 2017-10-12 ENCOUNTER — Telehealth: Payer: Self-pay | Admitting: *Deleted

## 2017-10-12 DIAGNOSIS — B964 Proteus (mirabilis) (morganii) as the cause of diseases classified elsewhere: Secondary | ICD-10-CM | POA: Diagnosis not present

## 2017-10-12 DIAGNOSIS — N390 Urinary tract infection, site not specified: Secondary | ICD-10-CM | POA: Diagnosis not present

## 2017-10-12 DIAGNOSIS — T83511D Infection and inflammatory reaction due to indwelling urethral catheter, subsequent encounter: Secondary | ICD-10-CM | POA: Diagnosis not present

## 2017-10-12 DIAGNOSIS — F0391 Unspecified dementia with behavioral disturbance: Secondary | ICD-10-CM | POA: Diagnosis not present

## 2017-10-12 DIAGNOSIS — M5031 Other cervical disc degeneration,  high cervical region: Secondary | ICD-10-CM | POA: Diagnosis not present

## 2017-10-12 DIAGNOSIS — B9620 Unspecified Escherichia coli [E. coli] as the cause of diseases classified elsewhere: Secondary | ICD-10-CM | POA: Diagnosis not present

## 2017-10-12 NOTE — Telephone Encounter (Signed)
Received Home Health Certification and Plan of Care; forwarded to provider/SLS 04/12  

## 2017-10-15 ENCOUNTER — Other Ambulatory Visit: Payer: Self-pay | Admitting: Family Medicine

## 2017-10-15 DIAGNOSIS — F411 Generalized anxiety disorder: Secondary | ICD-10-CM

## 2017-10-15 DIAGNOSIS — E538 Deficiency of other specified B group vitamins: Secondary | ICD-10-CM

## 2017-10-15 MED ORDER — RIVASTIGMINE 4.6 MG/24HR TD PT24: 4.6000 mg | MEDICATED_PATCH | Freq: Every day | TRANSDERMAL | 3 refills | Status: DC

## 2017-10-15 MED ORDER — MEMANTINE HCL 5 MG PO TABS: 5.0000 mg | ORAL_TABLET | Freq: Every day | ORAL | 5 refills | Status: DC

## 2017-10-15 MED ORDER — METHADONE HCL 5 MG PO TABS: 2.5000 mg | ORAL_TABLET | Freq: Three times a day (TID) | ORAL | 0 refills | Status: DC | PRN

## 2017-10-15 NOTE — Progress Notes (Signed)
His wife was in for a visit and he needed refills on his Methadone, Namenda and Exelon. Has been on the latter 2 for a month with no obvious change. Will stay on Namenda daily for now and Exelon will be decided upon by patient and family

## 2017-10-16 DIAGNOSIS — F0391 Unspecified dementia with behavioral disturbance: Secondary | ICD-10-CM | POA: Diagnosis not present

## 2017-10-16 DIAGNOSIS — N390 Urinary tract infection, site not specified: Secondary | ICD-10-CM | POA: Diagnosis not present

## 2017-10-16 DIAGNOSIS — B964 Proteus (mirabilis) (morganii) as the cause of diseases classified elsewhere: Secondary | ICD-10-CM | POA: Diagnosis not present

## 2017-10-16 DIAGNOSIS — B9620 Unspecified Escherichia coli [E. coli] as the cause of diseases classified elsewhere: Secondary | ICD-10-CM | POA: Diagnosis not present

## 2017-10-16 DIAGNOSIS — M5031 Other cervical disc degeneration,  high cervical region: Secondary | ICD-10-CM | POA: Diagnosis not present

## 2017-10-16 DIAGNOSIS — T83511D Infection and inflammatory reaction due to indwelling urethral catheter, subsequent encounter: Secondary | ICD-10-CM | POA: Diagnosis not present

## 2017-10-17 ENCOUNTER — Telehealth: Payer: Self-pay | Admitting: Family Medicine

## 2017-10-17 DIAGNOSIS — B964 Proteus (mirabilis) (morganii) as the cause of diseases classified elsewhere: Secondary | ICD-10-CM | POA: Diagnosis not present

## 2017-10-17 DIAGNOSIS — T83511D Infection and inflammatory reaction due to indwelling urethral catheter, subsequent encounter: Secondary | ICD-10-CM | POA: Diagnosis not present

## 2017-10-17 DIAGNOSIS — N390 Urinary tract infection, site not specified: Secondary | ICD-10-CM | POA: Diagnosis not present

## 2017-10-17 DIAGNOSIS — B9620 Unspecified Escherichia coli [E. coli] as the cause of diseases classified elsewhere: Secondary | ICD-10-CM | POA: Diagnosis not present

## 2017-10-17 DIAGNOSIS — F0391 Unspecified dementia with behavioral disturbance: Secondary | ICD-10-CM | POA: Diagnosis not present

## 2017-10-17 DIAGNOSIS — M5031 Other cervical disc degeneration,  high cervical region: Secondary | ICD-10-CM | POA: Diagnosis not present

## 2017-10-17 NOTE — Telephone Encounter (Signed)
Copied from [place]. Topic: Quick Communication - See Telephone Encounter >> Oct 17, 2017 11:15 AM Synthia Innocent wrote: CRM for notification. See Telephone encounter for: 10/17/17. OT is being discharged through [facility], all goals have been met.

## 2017-10-17 NOTE — Telephone Encounter (Signed)
fyi

## 2017-10-18 DIAGNOSIS — T83511D Infection and inflammatory reaction due to indwelling urethral catheter, subsequent encounter: Secondary | ICD-10-CM | POA: Diagnosis not present

## 2017-10-18 DIAGNOSIS — N390 Urinary tract infection, site not specified: Secondary | ICD-10-CM | POA: Diagnosis not present

## 2017-10-18 DIAGNOSIS — F0391 Unspecified dementia with behavioral disturbance: Secondary | ICD-10-CM | POA: Diagnosis not present

## 2017-10-18 DIAGNOSIS — M5031 Other cervical disc degeneration,  high cervical region: Secondary | ICD-10-CM | POA: Diagnosis not present

## 2017-10-18 DIAGNOSIS — B9620 Unspecified Escherichia coli [E. coli] as the cause of diseases classified elsewhere: Secondary | ICD-10-CM | POA: Diagnosis not present

## 2017-10-18 DIAGNOSIS — B964 Proteus (mirabilis) (morganii) as the cause of diseases classified elsewhere: Secondary | ICD-10-CM | POA: Diagnosis not present

## 2017-10-31 ENCOUNTER — Other Ambulatory Visit: Payer: Self-pay | Admitting: Family Medicine

## 2017-11-01 DIAGNOSIS — N319 Neuromuscular dysfunction of bladder, unspecified: Secondary | ICD-10-CM | POA: Diagnosis not present

## 2017-11-05 ENCOUNTER — Encounter: Payer: Self-pay | Admitting: Family Medicine

## 2017-11-05 ENCOUNTER — Ambulatory Visit (INDEPENDENT_AMBULATORY_CARE_PROVIDER_SITE_OTHER): Payer: Medicare HMO | Admitting: Family Medicine

## 2017-11-05 VITALS — BP 105/65 | HR 60 | Temp 98.1°F | Resp 18

## 2017-11-05 DIAGNOSIS — D649 Anemia, unspecified: Secondary | ICD-10-CM | POA: Diagnosis not present

## 2017-11-05 DIAGNOSIS — G8929 Other chronic pain: Secondary | ICD-10-CM

## 2017-11-05 DIAGNOSIS — E538 Deficiency of other specified B group vitamins: Secondary | ICD-10-CM

## 2017-11-05 DIAGNOSIS — R739 Hyperglycemia, unspecified: Secondary | ICD-10-CM | POA: Diagnosis not present

## 2017-11-05 DIAGNOSIS — F411 Generalized anxiety disorder: Secondary | ICD-10-CM

## 2017-11-05 DIAGNOSIS — E039 Hypothyroidism, unspecified: Secondary | ICD-10-CM

## 2017-11-05 DIAGNOSIS — M545 Low back pain: Secondary | ICD-10-CM

## 2017-11-05 DIAGNOSIS — S12101S Unspecified nondisplaced fracture of second cervical vertebra, sequela: Secondary | ICD-10-CM | POA: Diagnosis not present

## 2017-11-05 DIAGNOSIS — E785 Hyperlipidemia, unspecified: Secondary | ICD-10-CM | POA: Diagnosis not present

## 2017-11-05 LAB — COMPREHENSIVE METABOLIC PANEL
ALBUMIN: 3.4 g/dL — AB (ref 3.5–5.2)
ALK PHOS: 46 U/L (ref 39–117)
ALT: 5 U/L (ref 0–53)
AST: 8 U/L (ref 0–37)
BUN: 17 mg/dL (ref 6–23)
CHLORIDE: 100 meq/L (ref 96–112)
CO2: 30 mEq/L (ref 19–32)
Calcium: 8.7 mg/dL (ref 8.4–10.5)
Creatinine, Ser: 0.88 mg/dL (ref 0.40–1.50)
GFR: 86.43 mL/min (ref >60.00)
Glucose, Bld: 102 mg/dL — ABNORMAL HIGH (ref 70–99)
Potassium: 4.5 mEq/L (ref 3.5–5.1)
SODIUM: 136 meq/L (ref 135–145)
TOTAL PROTEIN: 6.6 g/dL (ref 6.0–8.3)
Total Bilirubin: 0.5 mg/dL (ref 0.2–1.2)

## 2017-11-05 LAB — CBC WITH DIFFERENTIAL/PLATELET
BASOS PCT: 0.6 % (ref 0.0–3.0)
Basophils Absolute: 0 10*3/uL (ref 0.0–0.1)
Eosinophils Absolute: 0.1 10*3/uL (ref 0.0–0.7)
Eosinophils Relative: 2 % (ref 0.0–5.0)
HCT: 33.6 % — ABNORMAL LOW (ref 39.0–52.0)
HEMOGLOBIN: 11.2 g/dL — AB (ref 13.0–17.0)
LYMPHS ABS: 0.7 10*3/uL (ref 0.7–4.0)
Lymphocytes Relative: 14.6 % (ref 12.0–46.0)
MCHC: 33.2 g/dL (ref 30.0–36.0)
MCV: 86.6 fl (ref 78.0–100.0)
MONO ABS: 0.5 10*3/uL (ref 0.1–1.0)
MONOS PCT: 10.4 % (ref 3.0–12.0)
NEUTROS PCT: 72.4 % (ref 43.0–77.0)
Neutro Abs: 3.7 10*3/uL (ref 1.4–7.7)
Platelets: 162 10*3/uL (ref 150.0–400.0)
RBC: 3.88 Mil/uL — AB (ref 4.22–5.81)
RDW: 15.5 % (ref 11.5–15.5)
WBC: 5.1 10*3/uL (ref 4.0–10.5)

## 2017-11-05 LAB — LIPID PANEL
CHOL/HDL RATIO: 3
Cholesterol: 140 mg/dL (ref 0–200)
HDL: 45.4 mg/dL (ref >39.00)
LDL CALC: 84 mg/dL (ref 0–99)
NONHDL: 94.5
Triglycerides: 53 mg/dL (ref 0.0–149.0)
VLDL: 10.6 mg/dL (ref 0.0–40.0)

## 2017-11-05 LAB — HEMOGLOBIN A1C: Hgb A1c MFr Bld: 5.8 % (ref 4.6–6.5)

## 2017-11-05 LAB — TSH: TSH: 4.14 u[IU]/mL (ref 0.35–4.50)

## 2017-11-05 NOTE — Assessment & Plan Note (Signed)
On Levothyroxine, continue to monitor 

## 2017-11-05 NOTE — Assessment & Plan Note (Signed)
hgba1c acceptable, minimize simple carbs. Increase exercise as tolerated.  

## 2017-11-05 NOTE — Progress Notes (Signed)
Subjective:  I acted as a Neurosurgeon for Dr. Abner Greenspan. [name], [place]  Patient ID: Brandon Franklin, male    DOB: 06-17-1926, 84 y.o.   MRN: [id number]  No chief complaint on file.   HPI  Patient is in today for 3 month follow up and is accompanied by one of his daughters and his wife. He denies any acute concerns. There has been no febrile illness or hospitalizations since the last visit. Pain well controlled on current meds. Saw urology last week to manage his catheter and no change in that care  Patient Care Team: Bradd Canary, MD as PCP - General (Family Medicine) Bebe Shaggy., MD (Urology)   Past Medical History:  Diagnosis Date  . 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 Allergy    seasonal  . Arthritis   . Cellulitis 09/20/2015  . CHF (congestive heart failure) (HCC)   . Complication of indwelling urinary catheter (HCC)    Suprapubic catheter in place s/p urologic surgery   . Depression   . Hyperglycemia 05/09/2015  . Low back pain 04/04/2015  . Neck fracture (HCC)   . Neuromuscular disorder (HCC)   . Obesity 04/04/2015  . Preventative health care 05/30/2017  . Urinary anomaly   . Urinary retention 09/21/2016  . Vitamin B12 deficiency 04/04/2015    Past Surgical History:  Procedure Laterality Date  . INSERTION OF SUPRAPUBIC CATHETER    . SURGERY SCROTAL / TESTICULAR Right     Family History  Problem Relation Age of Onset  . Cancer Sister     Social History   Socioeconomic History  . Marital status: Married    Spouse name: Not on file  . Number of children: Not on file  . Years of education: Not on file  . Highest education level: Not on file  Occupational History  . Occupation: retired  Engineer, production  . Financial resource strain: Not on file  . Food insecurity:    Worry: Not on file    Inability: Not on file  . Transportation needs:    Medical: Not on file    Non-medical: Not on file  Tobacco Use  . Smoking status: Never Smoker  . Smokeless tobacco: Never  Used  Substance and Sexual Activity  . Alcohol use: No    Alcohol/week: 0.0 oz  . Drug use: No  . Sexual activity: Never  Lifestyle  . Physical activity:    Days per week: Not on file    Minutes per session: Not on file  . Stress: Not on file  Relationships  . Social connections:    Talks on phone: Not on file    Gets together: Not on file    Attends religious service: Not on file    Active member of club or organization: Not on file    Attends meetings of clubs or organizations: Not on file    Relationship status: Not on file  . Intimate partner violence:    Fear of current or ex partner: Not on file    Emotionally abused: Not on file    Physically abused: Not on file    Forced sexual activity: Not on file  Other Topics Concern  . Not on file  Social History Narrative  . Not on file    Outpatient Medications Prior to Visit  Medication Sig Dispense Refill  . bisacodyl (DULCOLAX) 5 MG EC tablet Take 5 mg by mouth daily as needed.    . cholecalciferol (VITAMIN D) 1000 UNITS  tablet Take 1,000 Units by mouth daily.    . cyanocobalamin (,VITAMIN B-12,) 1000 MCG/ML injection Inject 1 mL (1,000 mcg total) into the muscle every 30 (thirty) days. 10 mL 1  . furosemide (LASIX) 40 MG tablet TAKE 1 TABLET BY MOUTH ONCE DAILY 90 tablet 1  . gabapentin (NEURONTIN) 300 MG capsule TAKE 1 CAPSULE BY MOUTH THREE TIMES DAILY 90 capsule 1  . levothyroxine (SYNTHROID, LEVOTHROID) 25 MCG tablet Take 1 tablet (25 mcg total) by mouth daily before breakfast. 90 tablet 1  . memantine (NAMENDA) 5 MG tablet Take 1 tablet (5 mg total) by mouth daily. 30 tablet 5  . methadone (DOLOPHINE) 5 MG tablet Take 0.5 tablets (2.5 mg total) by mouth 3 (three) times daily as needed. 45 tablet 0  . nystatin cream (MYCOSTATIN) Apply 1 application topically 2 (two) times daily. 30 g 0  . oxybutynin (DITROPAN) 5 MG tablet Take 10 mg by mouth daily.     Marland Kitchen senna-docusate (SENOKOT-S) 8.6-50 MG per tablet Take 4 tablets by  mouth 2 (two) times daily. Only takes as needed.    . Syringe/Needle, Disp, (SYRINGE 3CC/25GX1") 25G X 1" 3 ML MISC To use with vitamin B12 50 each 1  . rivastigmine (EXELON) 4.6 mg/24hr Place 1 patch (4.6 mg total) onto the skin daily. 30 patch 3   No facility-administered medications prior to visit.     Allergies  Allergen Reactions  . Ciprofloxacin   . Levaquin [Levofloxacin In D5w] Other (See Comments)  . Doxycycline Hyclate Rash    Review of Systems  Constitutional: Negative for fever.       Obese, in wheelchair  HENT: Negative for congestion.   Eyes: Negative for blurred vision.  Respiratory: Negative for cough.   Cardiovascular: Negative for chest pain and palpitations.  Gastrointestinal: Negative for vomiting.  Musculoskeletal: Negative for back pain.  Skin: Negative for rash.  Neurological: Negative for loss of consciousness and headaches.       Objective:    Physical Exam  Constitutional: He is oriented to person, place, and time. He appears well-developed and well-nourished. No distress.  Obese and in WC  HENT:  Head: Normocephalic and atraumatic.  Nose: Nose normal.  Eyes: Right eye exhibits no discharge. Left eye exhibits no discharge.  Neck: Normal range of motion. Neck supple.  Cardiovascular: Normal rate and regular rhythm.  No murmur heard. Pulmonary/Chest: Effort normal and breath sounds normal.  Abdominal: Soft. Bowel sounds are normal. There is no tenderness.  Genitourinary:  Genitourinary Comments: Indwelling foley cath with dark yellow urine in bag,   Musculoskeletal: He exhibits no edema.  Neurological: He is alert and oriented to person, place, and time.  Skin: Skin is warm and dry.  Psychiatric: He has a normal mood and affect.  Nursing note and vitals reviewed.   BP 105/65 (BP Location: Left Arm, Patient Position: Sitting, Cuff Size: Normal)   Pulse 60   Temp 98.1 F (36.7 C) (Oral)   Resp 18   SpO2 100%  Wt Readings from Last 3  Encounters:  08/13/17 246 lb (111.6 kg)  05/28/17 246 lb (111.6 kg)  09/21/16 246 lb (111.6 kg)   BP Readings from Last 3 Encounters:  11/05/17 105/65  08/13/17 (!) 81/52  05/28/17 (!) 94/56     Immunization History  Administered Date(s) Administered  . Influenza, High Dose Seasonal PF 03/23/2016, 05/28/2017  . Influenza,inj,Quad PF,6+ Mos 04/27/2015  . Pneumococcal Conjugate-13 09/13/2015    Health Maintenance  Topic Date Due  .  TETANUS/TDAP  08/30/1946  . PNA vac Low Risk Adult (2 of 2 - PPSV23) 09/12/2016  . INFLUENZA VACCINE  01/31/2018    Lab Results  Component Value Date   WBC 6.4 05/28/2017   HGB 11.8 (L) 05/28/2017   HCT 36.0 (L) 05/28/2017   PLT 181.0 05/28/2017   GLUCOSE 118 (H) 05/28/2017   CHOL 133 05/28/2017   TRIG 56.0 05/28/2017   HDL 39.60 05/28/2017   LDLCALC 82 05/28/2017   ALT 5 05/28/2017   AST 9 05/28/2017   NA 138 05/28/2017   K 4.1 05/28/2017   CL 101 05/28/2017   CREATININE 0.91 05/28/2017   BUN 17 05/28/2017   CO2 30 05/28/2017   TSH 3.70 05/28/2017   INR 1.1 05/17/2008   HGBA1C 5.8 05/28/2017    Lab Results  Component Value Date   TSH 3.70 05/28/2017   Lab Results  Component Value Date   WBC 6.4 05/28/2017   HGB 11.8 (L) 05/28/2017   HCT 36.0 (L) 05/28/2017   MCV 89.0 05/28/2017   PLT 181.0 05/28/2017   Lab Results  Component Value Date   NA 138 05/28/2017   K 4.1 05/28/2017   CO2 30 05/28/2017   GLUCOSE 118 (H) 05/28/2017   BUN 17 05/28/2017   CREATININE 0.91 05/28/2017   BILITOT 0.5 05/28/2017   ALKPHOS 49 05/28/2017   AST 9 05/28/2017   ALT 5 05/28/2017   PROT 6.9 05/28/2017   ALBUMIN 3.6 05/28/2017   CALCIUM 9.1 05/28/2017   GFR 83.24 05/28/2017   Lab Results  Component Value Date   CHOL 133 05/28/2017   Lab Results  Component Value Date   HDL 39.60 05/28/2017   Lab Results  Component Value Date   LDLCALC 82 05/28/2017   Lab Results  Component Value Date   TRIG 56.0 05/28/2017   Lab Results    Component Value Date   CHOLHDL 3 05/28/2017   Lab Results  Component Value Date   HGBA1C 5.8 05/28/2017         Assessment & Plan:   Problem List Items Addressed This Visit    Neck fracture (HCC)    Minimal pain with current med      Low back pain    Stable on current meds, still doing well on dose of Methadone no changes needed.       Vitamin B 12 deficiency    Check level today, takes the vitamin B 12 shots monthly      Hyperglycemia    hgba1c acceptable, minimize simple carbs. Increase exercise as tolerated.       Relevant Orders   Comprehensive metabolic panel   Hemoglobin A1c   Anxiety state   Hypothyroid    On Levothyroxine, continue to monitor      Relevant Orders   TSH   Anemia - Primary    Increase leafy greens, consider increased lean red meat and using cast iron cookware. Continue to monitor, report any concerns, check level      Relevant Orders   CBC w/Diff    Other Visit Diagnoses    Hyperlipidemia, unspecified hyperlipidemia type       Relevant Orders   Lipid panel      I have discontinued Marita [place] Amis's rivastigmine. I am also having him maintain his bisacodyl, cholecalciferol, oxybutynin, senna-docusate, SYRINGE 3CC/25GX1", nystatin cream, cyanocobalamin, furosemide, levothyroxine, methadone, memantine, and gabapentin.  No orders of the defined types were placed in this encounter.   CMA served as Neurosurgeon during  this visit. History, Physical and Plan performed by medical provider. Documentation and orders reviewed and attested to.  Penni Homans, MD

## 2017-11-05 NOTE — Assessment & Plan Note (Signed)
Increase leafy greens, consider increased lean red meat and using cast iron cookware. Continue to monitor, report any concerns, check level

## 2017-11-05 NOTE — Patient Instructions (Signed)
Anemia Anemia is a condition in which you do not have enough red blood cells or hemoglobin. Hemoglobin is a substance in red blood cells that carries oxygen. When you do not have enough red blood cells or hemoglobin (are anemic), your body cannot get enough oxygen and your organs may not work properly. As a result, you may feel very tired or have other problems. What are the causes? Common causes of anemia include:  Excessive bleeding. Anemia can be caused by excessive bleeding inside or outside the body, including bleeding from the intestine or from periods in women.  Poor nutrition.  Long-lasting (chronic) kidney, thyroid, and liver disease.  Bone marrow disorders.  Cancer and treatments for cancer.  HIV (human immunodeficiency virus) and AIDS (acquired immunodeficiency syndrome).  Treatments for HIV and AIDS.  Spleen problems.  Blood disorders.  Infections, medicines, and autoimmune disorders that destroy red blood cells.  What are the signs or symptoms? Symptoms of this condition include:  Minor weakness.  Dizziness.  Headache.  Feeling heartbeats that are irregular or faster than normal (palpitations).  Shortness of breath, especially with exercise.  Paleness.  Cold sensitivity.  Indigestion.  Nausea.  Difficulty sleeping.  Difficulty concentrating.  Symptoms may occur suddenly or develop slowly. If your anemia is mild, you may not have symptoms. How is this diagnosed? This condition is diagnosed based on:  Blood tests.  Your medical history.  A physical exam.  Bone marrow biopsy.  Your health care provider may also check your stool (feces) for blood and may do additional testing to look for the cause of your bleeding. You may also have other tests, including:  Imaging tests, such as a CT scan or MRI.  Endoscopy.  Colonoscopy.  How is this treated? Treatment for this condition depends on the cause. If you continue to lose a lot of blood,  you may need to be treated at a hospital. Treatment may include:  Taking supplements of iron, vitamin B12, or folic acid.  Taking a hormone medicine (erythropoietin) that can help to stimulate red blood cell growth.  Having a blood transfusion. This may be needed if you lose a lot of blood.  Making changes to your diet.  Having surgery to remove your spleen.  Follow these instructions at home:  Take over-the-counter and prescription medicines only as told by your health care provider.  Take supplements only as told by your health care provider.  Follow any diet instructions that you were given.  Keep all follow-up visits as told by your health care provider. This is important. Contact a health care provider if:  You develop new bleeding anywhere in the body. Get help right away if:  You are very weak.  You are short of breath.  You have pain in your abdomen or chest.  You are dizzy or feel faint.  You have trouble concentrating.  You have bloody or black, tarry stools.  You vomit repeatedly or you vomit up blood. Summary  Anemia is a condition in which you do not have enough red blood cells or enough of a substance in your red blood cells that carries oxygen (hemoglobin).  Symptoms may occur suddenly or develop slowly.  If your anemia is mild, you may not have symptoms.  This condition is diagnosed with blood tests as well as a medical history and physical exam. Other tests may be needed.  Treatment for this condition depends on the cause of the anemia. This information is not intended to replace advice   given to you by your health care provider. Make sure you discuss any questions you have with your health care provider. Document Released: 07/27/2004 Document Revised: 07/21/2016 Document Reviewed: 07/21/2016 Elsevier Interactive Patient Education  [employer].

## 2017-11-05 NOTE — Assessment & Plan Note (Signed)
Minimal pain with current med

## 2017-11-05 NOTE — Assessment & Plan Note (Addendum)
Check level today, takes the vitamin B 12 shots monthly

## 2017-11-05 NOTE — Assessment & Plan Note (Signed)
Stable on current meds, still doing well on dose of Methadone no changes needed.

## 2017-11-09 NOTE — Addendum Note (Signed)
Addended by: Crissie Sickles A on: 11/09/2017 04:27 PM   Modules accepted: Orders

## 2017-11-13 ENCOUNTER — Encounter: Payer: Self-pay | Admitting: Family Medicine

## 2017-11-13 ENCOUNTER — Other Ambulatory Visit: Payer: Self-pay | Admitting: Family Medicine

## 2017-11-13 DIAGNOSIS — E538 Deficiency of other specified B group vitamins: Secondary | ICD-10-CM

## 2017-11-13 DIAGNOSIS — F411 Generalized anxiety disorder: Secondary | ICD-10-CM

## 2017-11-13 MED ORDER — METHADONE HCL 5 MG PO TABS: 2.5000 mg | ORAL_TABLET | Freq: Three times a day (TID) | ORAL | 0 refills | Status: DC | PRN

## 2017-11-29 DIAGNOSIS — N319 Neuromuscular dysfunction of bladder, unspecified: Secondary | ICD-10-CM | POA: Diagnosis not present

## 2017-12-18 DIAGNOSIS — Z960 Presence of urogenital implants: Secondary | ICD-10-CM | POA: Diagnosis not present

## 2017-12-18 DIAGNOSIS — J849 Interstitial pulmonary disease, unspecified: Secondary | ICD-10-CM | POA: Diagnosis not present

## 2017-12-18 DIAGNOSIS — J181 Lobar pneumonia, unspecified organism: Secondary | ICD-10-CM | POA: Diagnosis not present

## 2017-12-18 DIAGNOSIS — N319 Neuromuscular dysfunction of bladder, unspecified: Secondary | ICD-10-CM | POA: Diagnosis not present

## 2017-12-18 DIAGNOSIS — R509 Fever, unspecified: Secondary | ICD-10-CM | POA: Diagnosis not present

## 2017-12-18 DIAGNOSIS — I440 Atrioventricular block, first degree: Secondary | ICD-10-CM | POA: Diagnosis not present

## 2017-12-18 DIAGNOSIS — T83511A Infection and inflammatory reaction due to indwelling urethral catheter, initial encounter: Secondary | ICD-10-CM | POA: Diagnosis not present

## 2017-12-18 DIAGNOSIS — J189 Pneumonia, unspecified organism: Secondary | ICD-10-CM | POA: Diagnosis not present

## 2017-12-18 DIAGNOSIS — Z66 Do not resuscitate: Secondary | ICD-10-CM | POA: Diagnosis not present

## 2017-12-18 DIAGNOSIS — N390 Urinary tract infection, site not specified: Secondary | ICD-10-CM | POA: Diagnosis not present

## 2017-12-18 DIAGNOSIS — B9620 Unspecified Escherichia coli [E. coli] as the cause of diseases classified elsewhere: Secondary | ICD-10-CM | POA: Diagnosis not present

## 2017-12-18 DIAGNOSIS — E039 Hypothyroidism, unspecified: Secondary | ICD-10-CM | POA: Diagnosis not present

## 2017-12-18 DIAGNOSIS — F0390 Unspecified dementia without behavioral disturbance: Secondary | ICD-10-CM | POA: Diagnosis not present

## 2017-12-18 DIAGNOSIS — T83518A Infection and inflammatory reaction due to other urinary catheter, initial encounter: Secondary | ICD-10-CM | POA: Diagnosis not present

## 2017-12-18 DIAGNOSIS — Z7401 Bed confinement status: Secondary | ICD-10-CM | POA: Diagnosis not present

## 2017-12-18 DIAGNOSIS — Z79899 Other long term (current) drug therapy: Secondary | ICD-10-CM | POA: Diagnosis not present

## 2017-12-18 DIAGNOSIS — M2550 Pain in unspecified joint: Secondary | ICD-10-CM | POA: Diagnosis not present

## 2017-12-18 DIAGNOSIS — I4510 Unspecified right bundle-branch block: Secondary | ICD-10-CM | POA: Diagnosis not present

## 2017-12-18 DIAGNOSIS — R4182 Altered mental status, unspecified: Secondary | ICD-10-CM | POA: Diagnosis not present

## 2017-12-18 DIAGNOSIS — R9431 Abnormal electrocardiogram [ECG] [EKG]: Secondary | ICD-10-CM | POA: Diagnosis not present

## 2017-12-18 DIAGNOSIS — G8929 Other chronic pain: Secondary | ICD-10-CM | POA: Diagnosis not present

## 2017-12-18 DIAGNOSIS — I491 Atrial premature depolarization: Secondary | ICD-10-CM | POA: Diagnosis not present

## 2017-12-18 DIAGNOSIS — A419 Sepsis, unspecified organism: Secondary | ICD-10-CM | POA: Diagnosis not present

## 2017-12-25 DIAGNOSIS — Z66 Do not resuscitate: Secondary | ICD-10-CM | POA: Diagnosis not present

## 2017-12-25 DIAGNOSIS — J189 Pneumonia, unspecified organism: Secondary | ICD-10-CM | POA: Diagnosis not present

## 2017-12-25 DIAGNOSIS — N319 Neuromuscular dysfunction of bladder, unspecified: Secondary | ICD-10-CM | POA: Diagnosis not present

## 2017-12-25 DIAGNOSIS — E039 Hypothyroidism, unspecified: Secondary | ICD-10-CM | POA: Diagnosis not present

## 2017-12-25 DIAGNOSIS — F0390 Unspecified dementia without behavioral disturbance: Secondary | ICD-10-CM | POA: Diagnosis not present

## 2017-12-25 DIAGNOSIS — Z993 Dependence on wheelchair: Secondary | ICD-10-CM | POA: Diagnosis not present

## 2017-12-25 DIAGNOSIS — R1310 Dysphagia, unspecified: Secondary | ICD-10-CM | POA: Diagnosis not present

## 2017-12-25 DIAGNOSIS — Z9350 Unspecified cystostomy status: Secondary | ICD-10-CM | POA: Diagnosis not present

## 2017-12-25 DIAGNOSIS — N390 Urinary tract infection, site not specified: Secondary | ICD-10-CM | POA: Diagnosis not present

## 2017-12-26 ENCOUNTER — Telehealth: Payer: Self-pay | Admitting: Family Medicine

## 2017-12-26 NOTE — Telephone Encounter (Signed)
Ok to give vo for requested services and he needs a have a follow up appointment

## 2017-12-26 NOTE — Telephone Encounter (Signed)
Requesting social work eval, need to know something regarding this and nursing order asap.

## 2017-12-26 NOTE — Telephone Encounter (Signed)
Copied from CRM [phone]. Topic: Quick Communication - See Telephone Encounter >> Dec 25, 2017  4:30 PM Herby AbrahamJohnson, Shiquita C wrote: CRM for notification. See Telephone encounter for: 12/25/17.  Patty with [facility] called in to request verbal orders for nursing.   Frequency: 2 week 2 and 1 time every other week.    She also would like to make provider aware that pt was release from hospital 12/18/17-12/21/17 for pneumonia -  BP 102/68.     CB:  [phone]

## 2017-12-27 ENCOUNTER — Telehealth: Payer: Self-pay | Admitting: Family Medicine

## 2017-12-27 DIAGNOSIS — Z66 Do not resuscitate: Secondary | ICD-10-CM | POA: Diagnosis not present

## 2017-12-27 DIAGNOSIS — J189 Pneumonia, unspecified organism: Secondary | ICD-10-CM | POA: Diagnosis not present

## 2017-12-27 DIAGNOSIS — E039 Hypothyroidism, unspecified: Secondary | ICD-10-CM | POA: Diagnosis not present

## 2017-12-27 DIAGNOSIS — R1310 Dysphagia, unspecified: Secondary | ICD-10-CM | POA: Diagnosis not present

## 2017-12-27 DIAGNOSIS — Z993 Dependence on wheelchair: Secondary | ICD-10-CM | POA: Diagnosis not present

## 2017-12-27 DIAGNOSIS — F0390 Unspecified dementia without behavioral disturbance: Secondary | ICD-10-CM | POA: Diagnosis not present

## 2017-12-27 DIAGNOSIS — N319 Neuromuscular dysfunction of bladder, unspecified: Secondary | ICD-10-CM | POA: Diagnosis not present

## 2017-12-27 DIAGNOSIS — Z9350 Unspecified cystostomy status: Secondary | ICD-10-CM | POA: Diagnosis not present

## 2017-12-27 DIAGNOSIS — N390 Urinary tract infection, site not specified: Secondary | ICD-10-CM | POA: Diagnosis not present

## 2017-12-27 NOTE — Telephone Encounter (Signed)
Copied from CRM [phone]. Topic: General - Other >> Dec 27, 2017  3:56 PM Mcneil, Ja-Kwan wrote: Reason for CRM: Melanie with [facility] requests verbal orders for physical therapy for 2 times a week for 2 weeks and 1 time a week for 1 week. Shawna OrleansMelanie also states paperwork for wheel chair cushion will be faxed.Cb# [phone]

## 2017-12-28 DIAGNOSIS — Z993 Dependence on wheelchair: Secondary | ICD-10-CM | POA: Diagnosis not present

## 2017-12-28 DIAGNOSIS — Z9350 Unspecified cystostomy status: Secondary | ICD-10-CM | POA: Diagnosis not present

## 2017-12-28 DIAGNOSIS — J189 Pneumonia, unspecified organism: Secondary | ICD-10-CM | POA: Diagnosis not present

## 2017-12-28 DIAGNOSIS — E039 Hypothyroidism, unspecified: Secondary | ICD-10-CM | POA: Diagnosis not present

## 2017-12-28 DIAGNOSIS — Z66 Do not resuscitate: Secondary | ICD-10-CM | POA: Diagnosis not present

## 2017-12-28 DIAGNOSIS — R1310 Dysphagia, unspecified: Secondary | ICD-10-CM | POA: Diagnosis not present

## 2017-12-28 DIAGNOSIS — F0390 Unspecified dementia without behavioral disturbance: Secondary | ICD-10-CM | POA: Diagnosis not present

## 2017-12-28 DIAGNOSIS — N319 Neuromuscular dysfunction of bladder, unspecified: Secondary | ICD-10-CM | POA: Diagnosis not present

## 2017-12-28 DIAGNOSIS — N390 Urinary tract infection, site not specified: Secondary | ICD-10-CM | POA: Diagnosis not present

## 2017-12-28 NOTE — Telephone Encounter (Signed)
[facility] the ordes they need are for one visit a week for 3 weeks, than one every other week. Orders given otp. They

## 2017-12-28 NOTE — Telephone Encounter (Signed)
[facility] the ordes they need are for one visit a week for 3 weeks, than one every other week. Orders given otp , she will fax paperwork next week  Copied from CRM [phone]. Topic: Quick Communication - See Telephone Encounter >> Dec 25, 2017  4:30 PM Herby AbrahamJohnson, Shiquita C wrote: CRM for notification. See Telephone encounter for: 12/25/17.  Patty with [facility] called in to request verbal orders for nursing.   Frequency: 2 week 2 and 1 time every other week.    She also would like to make provider aware that pt was release from hospital 12/18/17-12/21/17 for pneumonia -  BP 102/68.     CB:  [phone]:  [phone] >> Dec 28, 2017 12:07 PM Floria RavelingStovall, Shana A wrote: Alexia Freestoneatty called back and would like Cassidy Tashiro to call her again when she gets a chance [phone]

## 2017-12-28 NOTE — Telephone Encounter (Signed)
Verbal orders given  

## 2017-12-28 NOTE — Telephone Encounter (Signed)
They will fax paperwork to be sign.

## 2017-12-31 DIAGNOSIS — E039 Hypothyroidism, unspecified: Secondary | ICD-10-CM | POA: Diagnosis not present

## 2017-12-31 DIAGNOSIS — Z66 Do not resuscitate: Secondary | ICD-10-CM | POA: Diagnosis not present

## 2017-12-31 DIAGNOSIS — Z993 Dependence on wheelchair: Secondary | ICD-10-CM | POA: Diagnosis not present

## 2017-12-31 DIAGNOSIS — R1310 Dysphagia, unspecified: Secondary | ICD-10-CM | POA: Diagnosis not present

## 2017-12-31 DIAGNOSIS — F0390 Unspecified dementia without behavioral disturbance: Secondary | ICD-10-CM | POA: Diagnosis not present

## 2017-12-31 DIAGNOSIS — N390 Urinary tract infection, site not specified: Secondary | ICD-10-CM | POA: Diagnosis not present

## 2017-12-31 DIAGNOSIS — Z9350 Unspecified cystostomy status: Secondary | ICD-10-CM | POA: Diagnosis not present

## 2017-12-31 DIAGNOSIS — J189 Pneumonia, unspecified organism: Secondary | ICD-10-CM | POA: Diagnosis not present

## 2017-12-31 DIAGNOSIS — N319 Neuromuscular dysfunction of bladder, unspecified: Secondary | ICD-10-CM | POA: Diagnosis not present

## 2018-01-02 ENCOUNTER — Other Ambulatory Visit: Payer: Self-pay | Admitting: Family Medicine

## 2018-01-03 DIAGNOSIS — Z66 Do not resuscitate: Secondary | ICD-10-CM | POA: Diagnosis not present

## 2018-01-03 DIAGNOSIS — N390 Urinary tract infection, site not specified: Secondary | ICD-10-CM | POA: Diagnosis not present

## 2018-01-03 DIAGNOSIS — R1310 Dysphagia, unspecified: Secondary | ICD-10-CM | POA: Diagnosis not present

## 2018-01-03 DIAGNOSIS — Z993 Dependence on wheelchair: Secondary | ICD-10-CM | POA: Diagnosis not present

## 2018-01-03 DIAGNOSIS — E039 Hypothyroidism, unspecified: Secondary | ICD-10-CM | POA: Diagnosis not present

## 2018-01-03 DIAGNOSIS — F0390 Unspecified dementia without behavioral disturbance: Secondary | ICD-10-CM | POA: Diagnosis not present

## 2018-01-03 DIAGNOSIS — N319 Neuromuscular dysfunction of bladder, unspecified: Secondary | ICD-10-CM | POA: Diagnosis not present

## 2018-01-03 DIAGNOSIS — Z9350 Unspecified cystostomy status: Secondary | ICD-10-CM | POA: Diagnosis not present

## 2018-01-03 DIAGNOSIS — J189 Pneumonia, unspecified organism: Secondary | ICD-10-CM | POA: Diagnosis not present

## 2018-01-08 DIAGNOSIS — J189 Pneumonia, unspecified organism: Secondary | ICD-10-CM | POA: Diagnosis not present

## 2018-01-08 DIAGNOSIS — N319 Neuromuscular dysfunction of bladder, unspecified: Secondary | ICD-10-CM | POA: Diagnosis not present

## 2018-01-08 DIAGNOSIS — Z9350 Unspecified cystostomy status: Secondary | ICD-10-CM | POA: Diagnosis not present

## 2018-01-08 DIAGNOSIS — F0390 Unspecified dementia without behavioral disturbance: Secondary | ICD-10-CM | POA: Diagnosis not present

## 2018-01-08 DIAGNOSIS — Z993 Dependence on wheelchair: Secondary | ICD-10-CM | POA: Diagnosis not present

## 2018-01-08 DIAGNOSIS — R1310 Dysphagia, unspecified: Secondary | ICD-10-CM | POA: Diagnosis not present

## 2018-01-08 DIAGNOSIS — Z66 Do not resuscitate: Secondary | ICD-10-CM | POA: Diagnosis not present

## 2018-01-08 DIAGNOSIS — E039 Hypothyroidism, unspecified: Secondary | ICD-10-CM | POA: Diagnosis not present

## 2018-01-08 DIAGNOSIS — N390 Urinary tract infection, site not specified: Secondary | ICD-10-CM | POA: Diagnosis not present

## 2018-01-09 ENCOUNTER — Encounter: Payer: Self-pay | Admitting: Family Medicine

## 2018-01-09 DIAGNOSIS — N390 Urinary tract infection, site not specified: Secondary | ICD-10-CM | POA: Diagnosis not present

## 2018-01-09 DIAGNOSIS — F0390 Unspecified dementia without behavioral disturbance: Secondary | ICD-10-CM | POA: Diagnosis not present

## 2018-01-09 DIAGNOSIS — J189 Pneumonia, unspecified organism: Secondary | ICD-10-CM | POA: Diagnosis not present

## 2018-01-09 DIAGNOSIS — E039 Hypothyroidism, unspecified: Secondary | ICD-10-CM | POA: Diagnosis not present

## 2018-01-09 DIAGNOSIS — R1310 Dysphagia, unspecified: Secondary | ICD-10-CM | POA: Diagnosis not present

## 2018-01-09 DIAGNOSIS — Z66 Do not resuscitate: Secondary | ICD-10-CM | POA: Diagnosis not present

## 2018-01-09 DIAGNOSIS — Z9350 Unspecified cystostomy status: Secondary | ICD-10-CM | POA: Diagnosis not present

## 2018-01-09 DIAGNOSIS — N319 Neuromuscular dysfunction of bladder, unspecified: Secondary | ICD-10-CM | POA: Diagnosis not present

## 2018-01-09 DIAGNOSIS — Z993 Dependence on wheelchair: Secondary | ICD-10-CM | POA: Diagnosis not present

## 2018-01-10 ENCOUNTER — Telehealth: Payer: Self-pay | Admitting: *Deleted

## 2018-01-10 ENCOUNTER — Other Ambulatory Visit: Payer: Self-pay | Admitting: Family Medicine

## 2018-01-10 DIAGNOSIS — F411 Generalized anxiety disorder: Secondary | ICD-10-CM

## 2018-01-10 DIAGNOSIS — E538 Deficiency of other specified B group vitamins: Secondary | ICD-10-CM

## 2018-01-10 MED ORDER — METHADONE HCL 5 MG PO TABS: 2.5000 mg | ORAL_TABLET | Freq: Three times a day (TID) | ORAL | 0 refills | Status: DC | PRN

## 2018-01-10 NOTE — Telephone Encounter (Signed)
Received Home Health Certification and Plan of Care; forwarded to provider/SLS 07/11  

## 2018-01-14 ENCOUNTER — Other Ambulatory Visit: Payer: Self-pay | Admitting: Family Medicine

## 2018-01-15 ENCOUNTER — Telehealth: Payer: Self-pay | Admitting: *Deleted

## 2018-01-15 NOTE — Telephone Encounter (Signed)
Received Physician Orders from [facility]; forwarded to provider/SLS 07/16 

## 2018-01-16 DIAGNOSIS — Z9350 Unspecified cystostomy status: Secondary | ICD-10-CM | POA: Diagnosis not present

## 2018-01-16 DIAGNOSIS — E039 Hypothyroidism, unspecified: Secondary | ICD-10-CM | POA: Diagnosis not present

## 2018-01-16 DIAGNOSIS — Z993 Dependence on wheelchair: Secondary | ICD-10-CM | POA: Diagnosis not present

## 2018-01-16 DIAGNOSIS — J189 Pneumonia, unspecified organism: Secondary | ICD-10-CM | POA: Diagnosis not present

## 2018-01-16 DIAGNOSIS — Z66 Do not resuscitate: Secondary | ICD-10-CM | POA: Diagnosis not present

## 2018-01-16 DIAGNOSIS — N319 Neuromuscular dysfunction of bladder, unspecified: Secondary | ICD-10-CM | POA: Diagnosis not present

## 2018-01-16 DIAGNOSIS — R1310 Dysphagia, unspecified: Secondary | ICD-10-CM | POA: Diagnosis not present

## 2018-01-16 DIAGNOSIS — N390 Urinary tract infection, site not specified: Secondary | ICD-10-CM | POA: Diagnosis not present

## 2018-01-16 DIAGNOSIS — F0390 Unspecified dementia without behavioral disturbance: Secondary | ICD-10-CM | POA: Diagnosis not present

## 2018-01-17 DIAGNOSIS — N319 Neuromuscular dysfunction of bladder, unspecified: Secondary | ICD-10-CM | POA: Diagnosis not present

## 2018-01-17 DIAGNOSIS — Z466 Encounter for fitting and adjustment of urinary device: Secondary | ICD-10-CM | POA: Diagnosis not present

## 2018-01-21 ENCOUNTER — Other Ambulatory Visit: Payer: Self-pay

## 2018-01-21 DIAGNOSIS — Z993 Dependence on wheelchair: Secondary | ICD-10-CM | POA: Diagnosis not present

## 2018-01-21 DIAGNOSIS — N390 Urinary tract infection, site not specified: Secondary | ICD-10-CM | POA: Diagnosis not present

## 2018-01-21 DIAGNOSIS — R1310 Dysphagia, unspecified: Secondary | ICD-10-CM | POA: Diagnosis not present

## 2018-01-21 DIAGNOSIS — Z9350 Unspecified cystostomy status: Secondary | ICD-10-CM | POA: Diagnosis not present

## 2018-01-21 DIAGNOSIS — J189 Pneumonia, unspecified organism: Secondary | ICD-10-CM | POA: Diagnosis not present

## 2018-01-21 DIAGNOSIS — F0390 Unspecified dementia without behavioral disturbance: Secondary | ICD-10-CM | POA: Diagnosis not present

## 2018-01-21 DIAGNOSIS — E039 Hypothyroidism, unspecified: Secondary | ICD-10-CM | POA: Diagnosis not present

## 2018-01-21 DIAGNOSIS — Z66 Do not resuscitate: Secondary | ICD-10-CM | POA: Diagnosis not present

## 2018-01-21 DIAGNOSIS — N319 Neuromuscular dysfunction of bladder, unspecified: Secondary | ICD-10-CM | POA: Diagnosis not present

## 2018-01-21 MED ORDER — CEFDINIR 300 MG PO CAPS: 300.0000 mg | ORAL_CAPSULE | Freq: Two times a day (BID) | ORAL | 0 refills | Status: AC

## 2018-01-25 DIAGNOSIS — F0390 Unspecified dementia without behavioral disturbance: Secondary | ICD-10-CM | POA: Diagnosis not present

## 2018-01-25 DIAGNOSIS — Z66 Do not resuscitate: Secondary | ICD-10-CM | POA: Diagnosis not present

## 2018-01-25 DIAGNOSIS — J189 Pneumonia, unspecified organism: Secondary | ICD-10-CM | POA: Diagnosis not present

## 2018-01-25 DIAGNOSIS — N319 Neuromuscular dysfunction of bladder, unspecified: Secondary | ICD-10-CM | POA: Diagnosis not present

## 2018-01-25 DIAGNOSIS — N390 Urinary tract infection, site not specified: Secondary | ICD-10-CM | POA: Diagnosis not present

## 2018-01-25 DIAGNOSIS — Z9350 Unspecified cystostomy status: Secondary | ICD-10-CM | POA: Diagnosis not present

## 2018-01-25 DIAGNOSIS — R1310 Dysphagia, unspecified: Secondary | ICD-10-CM | POA: Diagnosis not present

## 2018-01-25 DIAGNOSIS — Z993 Dependence on wheelchair: Secondary | ICD-10-CM | POA: Diagnosis not present

## 2018-01-25 DIAGNOSIS — E039 Hypothyroidism, unspecified: Secondary | ICD-10-CM | POA: Diagnosis not present

## 2018-02-05 ENCOUNTER — Telehealth: Payer: Self-pay | Admitting: Family Medicine

## 2018-02-05 DIAGNOSIS — R1310 Dysphagia, unspecified: Secondary | ICD-10-CM | POA: Diagnosis not present

## 2018-02-05 DIAGNOSIS — Z66 Do not resuscitate: Secondary | ICD-10-CM | POA: Diagnosis not present

## 2018-02-05 DIAGNOSIS — Z993 Dependence on wheelchair: Secondary | ICD-10-CM | POA: Diagnosis not present

## 2018-02-05 DIAGNOSIS — E039 Hypothyroidism, unspecified: Secondary | ICD-10-CM | POA: Diagnosis not present

## 2018-02-05 DIAGNOSIS — J189 Pneumonia, unspecified organism: Secondary | ICD-10-CM | POA: Diagnosis not present

## 2018-02-05 DIAGNOSIS — F0390 Unspecified dementia without behavioral disturbance: Secondary | ICD-10-CM | POA: Diagnosis not present

## 2018-02-05 DIAGNOSIS — N319 Neuromuscular dysfunction of bladder, unspecified: Secondary | ICD-10-CM | POA: Diagnosis not present

## 2018-02-05 DIAGNOSIS — N390 Urinary tract infection, site not specified: Secondary | ICD-10-CM | POA: Diagnosis not present

## 2018-02-05 DIAGNOSIS — Z9350 Unspecified cystostomy status: Secondary | ICD-10-CM | POA: Diagnosis not present

## 2018-02-05 NOTE — Telephone Encounter (Signed)
Copied from CRM [id number]. Topic: Quick Communication - See Telephone Encounter >> Feb 05, 2018 12:54 PM Terisa Starraylor, Brittany L wrote: CRM for notification. See Telephone encounter for: 02/05/18.  Bonita QuinLinda, RN with [facility] called and said he has not had a bowel movement in 2 weeks. She said that she has tried everything  she can for him to poop. She said he looks pretty miserable. You can reach [facility] @ [phone].

## 2018-02-05 NOTE — Telephone Encounter (Signed)
Spoke with RN, Bonita QuinLinda and gave the instructions below she voiced her understanding.

## 2018-02-05 NOTE — Telephone Encounter (Signed)
Encouraged increased hydration and fiber in diet. Daily probiotics. If bowels not moving can use MOM 2 tbls po in 4 oz of warm prune juice by mouth every 2-3 days. If no results then repeat in 4 hours with  Dulcolax suppository pr, may repeat again in 4 more hours as needed. Seek care if symptoms worsen. Consider daily Miralax and/or Dulcolax if symptoms persist.  Benefiber or Metamucil

## 2018-02-06 DIAGNOSIS — Z743 Need for continuous supervision: Secondary | ICD-10-CM | POA: Diagnosis not present

## 2018-02-06 DIAGNOSIS — R279 Unspecified lack of coordination: Secondary | ICD-10-CM | POA: Diagnosis not present

## 2018-02-06 DIAGNOSIS — R52 Pain, unspecified: Secondary | ICD-10-CM | POA: Diagnosis not present

## 2018-02-06 DIAGNOSIS — R4182 Altered mental status, unspecified: Secondary | ICD-10-CM | POA: Diagnosis not present

## 2018-02-06 DIAGNOSIS — F0390 Unspecified dementia without behavioral disturbance: Secondary | ICD-10-CM | POA: Diagnosis not present

## 2018-02-06 DIAGNOSIS — I959 Hypotension, unspecified: Secondary | ICD-10-CM | POA: Diagnosis not present

## 2018-02-06 DIAGNOSIS — K5900 Constipation, unspecified: Secondary | ICD-10-CM | POA: Diagnosis not present

## 2018-02-06 DIAGNOSIS — R58 Hemorrhage, not elsewhere classified: Secondary | ICD-10-CM | POA: Diagnosis not present

## 2018-02-15 DIAGNOSIS — N319 Neuromuscular dysfunction of bladder, unspecified: Secondary | ICD-10-CM | POA: Diagnosis not present

## 2018-02-15 DIAGNOSIS — Z466 Encounter for fitting and adjustment of urinary device: Secondary | ICD-10-CM | POA: Diagnosis not present

## 2018-02-20 ENCOUNTER — Encounter: Payer: Self-pay | Admitting: Family Medicine

## 2018-02-21 ENCOUNTER — Other Ambulatory Visit: Payer: Self-pay | Admitting: Family Medicine

## 2018-02-21 DIAGNOSIS — E538 Deficiency of other specified B group vitamins: Secondary | ICD-10-CM

## 2018-02-21 DIAGNOSIS — F411 Generalized anxiety disorder: Secondary | ICD-10-CM

## 2018-02-21 MED ORDER — METHADONE HCL 5 MG PO TABS: 2.5000 mg | ORAL_TABLET | Freq: Three times a day (TID) | ORAL | 0 refills | Status: DC | PRN

## 2018-02-22 ENCOUNTER — Telehealth: Payer: Self-pay | Admitting: *Deleted

## 2018-02-22 DIAGNOSIS — R1310 Dysphagia, unspecified: Secondary | ICD-10-CM | POA: Diagnosis not present

## 2018-02-22 DIAGNOSIS — E039 Hypothyroidism, unspecified: Secondary | ICD-10-CM | POA: Diagnosis not present

## 2018-02-22 DIAGNOSIS — N319 Neuromuscular dysfunction of bladder, unspecified: Secondary | ICD-10-CM | POA: Diagnosis not present

## 2018-02-22 DIAGNOSIS — Z66 Do not resuscitate: Secondary | ICD-10-CM | POA: Diagnosis not present

## 2018-02-22 DIAGNOSIS — Z9350 Unspecified cystostomy status: Secondary | ICD-10-CM | POA: Diagnosis not present

## 2018-02-22 DIAGNOSIS — N390 Urinary tract infection, site not specified: Secondary | ICD-10-CM | POA: Diagnosis not present

## 2018-02-22 DIAGNOSIS — F0390 Unspecified dementia without behavioral disturbance: Secondary | ICD-10-CM | POA: Diagnosis not present

## 2018-02-22 DIAGNOSIS — J189 Pneumonia, unspecified organism: Secondary | ICD-10-CM | POA: Diagnosis not present

## 2018-02-22 DIAGNOSIS — Z993 Dependence on wheelchair: Secondary | ICD-10-CM | POA: Diagnosis not present

## 2018-02-22 NOTE — Telephone Encounter (Signed)
Orders given.  

## 2018-02-22 NOTE — Telephone Encounter (Signed)
Received call from RN, Lawson FiscalLori reporting that pt fell after standing to get out of his bed on Monday. States his legs gave out on him. She states pt did not sustain any injury. Reports that his primary caregiver is his wife, (which has dementia) and they are working on getting pt placed in a skilled nursing facility. She is requesting to Re-ceritfy home health nursing care once a week for 5 more weeks until pt can be placed. Reports that spouse is refusing to turn on the air condition in the home and it is very hot in the house. She did hear Ronchi in pt's left lung and is requesting order to get a mobile xray. Reports temp of 99.  Verbal order given to complete xray.  Please advise if any additional recommendations?

## 2018-02-22 NOTE — Telephone Encounter (Signed)
Please give verbal for ongoing home health services as requested

## 2018-02-25 ENCOUNTER — Telehealth: Payer: Self-pay | Admitting: Family Medicine

## 2018-02-25 DIAGNOSIS — R509 Fever, unspecified: Secondary | ICD-10-CM | POA: Diagnosis not present

## 2018-02-25 NOTE — Telephone Encounter (Signed)
Routed to Dr. Abner GreenspanBlyth to review.

## 2018-02-25 NOTE — Telephone Encounter (Signed)
Dejah from [facility] called with xray report of : possible pulmonary infiltrate and small bilateral pleural effusions consistent with CHF vs pneumonia. Irving BurtonEmily [facility] notified.

## 2018-02-25 NOTE — Telephone Encounter (Signed)
Xray shows pulmonary infiltrate vs CHF what are his current symptoms so we can decide how to treat.

## 2018-02-26 NOTE — Telephone Encounter (Signed)
Author phoned pt. to f/u on sx in light of recent CXR results. Wife was not aware of pt's CHF diagnosis. Wife placed pt. On the phone per author's request, and pt. denied having fevers, or any changes in respiratory status recently. Pt. States, "I feel fine". Routed to Dr. Abner GreenspanBlyth.

## 2018-02-26 NOTE — Telephone Encounter (Signed)
If he feels well then have him take an extra Lasix daily x 3 days then drop back to one tab daily and rechekc CXR in 4 weeks

## 2018-02-27 ENCOUNTER — Telehealth: Payer: Self-pay | Admitting: Family Medicine

## 2018-02-27 ENCOUNTER — Encounter: Payer: Self-pay | Admitting: Family Medicine

## 2018-02-27 ENCOUNTER — Other Ambulatory Visit: Payer: Self-pay | Admitting: Family Medicine

## 2018-02-27 DIAGNOSIS — E538 Deficiency of other specified B group vitamins: Secondary | ICD-10-CM

## 2018-02-27 DIAGNOSIS — F411 Generalized anxiety disorder: Secondary | ICD-10-CM

## 2018-02-27 MED ORDER — METHADONE HCL 5 MG PO TABS: 2.5000 mg | ORAL_TABLET | Freq: Three times a day (TID) | ORAL | 0 refills | Status: DC | PRN

## 2018-02-27 NOTE — Telephone Encounter (Signed)
Copied from CRM [id number]. Topic: Quick Communication - See Telephone Encounter >> Feb 27, 2018  3:40 PM Tamela OddiMartin, Don'Quashia, NT wrote: CRM for notification. See Telephone encounter for: 02/27/18. PT daughter called and states [facility] (DOLOPHINE) 5 MG tablet. She states the pharamcy is out and will not have any until next week.. Patient will be out by then can you send the RX to  [id number] - [place], [place] - [address] [phone] (Phone) [phone] (Fax)

## 2018-02-27 NOTE — Telephone Encounter (Signed)
All set!

## 2018-02-28 MED ORDER — METHADONE HCL 5 MG PO TABS: 2.5000 mg | ORAL_TABLET | Freq: Three times a day (TID) | ORAL | 0 refills | Status: DC | PRN

## 2018-03-01 ENCOUNTER — Encounter: Payer: Self-pay | Admitting: Family Medicine

## 2018-03-01 DIAGNOSIS — Z8701 Personal history of pneumonia (recurrent): Secondary | ICD-10-CM | POA: Diagnosis not present

## 2018-03-01 DIAGNOSIS — Z66 Do not resuscitate: Secondary | ICD-10-CM | POA: Diagnosis not present

## 2018-03-01 DIAGNOSIS — N319 Neuromuscular dysfunction of bladder, unspecified: Secondary | ICD-10-CM | POA: Diagnosis not present

## 2018-03-01 DIAGNOSIS — Z87440 Personal history of urinary (tract) infections: Secondary | ICD-10-CM | POA: Diagnosis not present

## 2018-03-01 DIAGNOSIS — Z993 Dependence on wheelchair: Secondary | ICD-10-CM | POA: Diagnosis not present

## 2018-03-01 DIAGNOSIS — E039 Hypothyroidism, unspecified: Secondary | ICD-10-CM | POA: Diagnosis not present

## 2018-03-01 DIAGNOSIS — R1310 Dysphagia, unspecified: Secondary | ICD-10-CM | POA: Diagnosis not present

## 2018-03-01 DIAGNOSIS — Z9350 Unspecified cystostomy status: Secondary | ICD-10-CM | POA: Diagnosis not present

## 2018-03-01 DIAGNOSIS — F0390 Unspecified dementia without behavioral disturbance: Secondary | ICD-10-CM | POA: Diagnosis not present

## 2018-03-01 NOTE — Telephone Encounter (Signed)
Can you send to pharmacy. Called the number provided

## 2018-03-04 DIAGNOSIS — N319 Neuromuscular dysfunction of bladder, unspecified: Secondary | ICD-10-CM | POA: Diagnosis not present

## 2018-03-04 DIAGNOSIS — Z66 Do not resuscitate: Secondary | ICD-10-CM | POA: Diagnosis not present

## 2018-03-04 DIAGNOSIS — Z87440 Personal history of urinary (tract) infections: Secondary | ICD-10-CM | POA: Diagnosis not present

## 2018-03-04 DIAGNOSIS — E039 Hypothyroidism, unspecified: Secondary | ICD-10-CM | POA: Diagnosis not present

## 2018-03-04 DIAGNOSIS — R1310 Dysphagia, unspecified: Secondary | ICD-10-CM | POA: Diagnosis not present

## 2018-03-04 DIAGNOSIS — Z9350 Unspecified cystostomy status: Secondary | ICD-10-CM | POA: Diagnosis not present

## 2018-03-04 DIAGNOSIS — Z8701 Personal history of pneumonia (recurrent): Secondary | ICD-10-CM | POA: Diagnosis not present

## 2018-03-04 DIAGNOSIS — Z993 Dependence on wheelchair: Secondary | ICD-10-CM | POA: Diagnosis not present

## 2018-03-04 DIAGNOSIS — F0390 Unspecified dementia without behavioral disturbance: Secondary | ICD-10-CM | POA: Diagnosis not present

## 2018-03-05 ENCOUNTER — Telehealth: Payer: Self-pay | Admitting: Family Medicine

## 2018-03-05 NOTE — Telephone Encounter (Signed)
Called spouse and let patient know

## 2018-03-05 NOTE — Telephone Encounter (Signed)
Please fax

## 2018-03-05 NOTE — Telephone Encounter (Signed)
Copied from CRM [id number]. Topic: Quick Communication - See Telephone Encounter >> Mar 05, 2018 12:43 PM Raquel Sarna wrote: Lillia Abed  w/  [facility] - [phone]  ext [phone] Checking on skilled nursing order form that was faxed on 02-27-18.  This was needing to be signed and dated.  Please call back to let her know of the status.

## 2018-03-05 NOTE — Telephone Encounter (Signed)
Yes, just got it from Dr. Abner Greenspan today to fax/SLS 09/03

## 2018-03-06 NOTE — Telephone Encounter (Signed)
These papers were received from provider upon RTO on 09/03/0/19 and returned via fax with confirmation/SLS 09/04

## 2018-03-11 ENCOUNTER — Ambulatory Visit (INDEPENDENT_AMBULATORY_CARE_PROVIDER_SITE_OTHER): Payer: Medicare HMO | Admitting: Family Medicine

## 2018-03-11 ENCOUNTER — Ambulatory Visit (HOSPITAL_BASED_OUTPATIENT_CLINIC_OR_DEPARTMENT_OTHER)
Admission: RE | Admit: 2018-03-11 | Discharge: 2018-03-11 | Disposition: A | Discharge status: Home / Self Care | Payer: Medicare HMO | Source: Ambulatory Visit | Attending: Family Medicine | Admitting: Family Medicine

## 2018-03-11 ENCOUNTER — Encounter: Payer: Self-pay | Admitting: Family Medicine

## 2018-03-11 VITALS — BP 92/54 | HR 64 | Temp 98.5°F | Resp 18 | Wt 246.0 lb

## 2018-03-11 DIAGNOSIS — R059 Cough, unspecified: Secondary | ICD-10-CM

## 2018-03-11 DIAGNOSIS — J90 Pleural effusion, not elsewhere classified: Secondary | ICD-10-CM | POA: Diagnosis not present

## 2018-03-11 DIAGNOSIS — K5900 Constipation, unspecified: Secondary | ICD-10-CM

## 2018-03-11 DIAGNOSIS — I517 Cardiomegaly: Secondary | ICD-10-CM | POA: Insufficient documentation

## 2018-03-11 DIAGNOSIS — D649 Anemia, unspecified: Secondary | ICD-10-CM | POA: Diagnosis not present

## 2018-03-11 DIAGNOSIS — J189 Pneumonia, unspecified organism: Secondary | ICD-10-CM | POA: Diagnosis not present

## 2018-03-11 DIAGNOSIS — R05 Cough: Secondary | ICD-10-CM

## 2018-03-11 DIAGNOSIS — R739 Hyperglycemia, unspecified: Secondary | ICD-10-CM

## 2018-03-11 DIAGNOSIS — I700 Atherosclerosis of aorta: Secondary | ICD-10-CM | POA: Insufficient documentation

## 2018-03-11 DIAGNOSIS — Z23 Encounter for immunization: Secondary | ICD-10-CM | POA: Diagnosis not present

## 2018-03-11 DIAGNOSIS — I509 Heart failure, unspecified: Secondary | ICD-10-CM

## 2018-03-11 DIAGNOSIS — S12101S Unspecified nondisplaced fracture of second cervical vertebra, sequela: Secondary | ICD-10-CM

## 2018-03-11 MED ORDER — METHYLNALTREXONE BROMIDE 150 MG PO TABS: 1.0000 | ORAL_TABLET | Freq: Every day | ORAL | 1 refills | Status: AC | PRN

## 2018-03-11 NOTE — Assessment & Plan Note (Signed)
Check CXR. Mild cough today

## 2018-03-11 NOTE — Assessment & Plan Note (Signed)
Using Methadone very low dose and this manages his symptoms

## 2018-03-11 NOTE — Assessment & Plan Note (Signed)
hgba1c acceptable, minimize simple carbs. Increase exercise as tolerated.  

## 2018-03-11 NOTE — Assessment & Plan Note (Signed)
Increase leafy greens, consider increased lean red meat and using cast iron cookware. Continue to monitor, report any concerns 

## 2018-03-11 NOTE — Progress Notes (Signed)
Subjective:    Patient ID: Brandon Franklin, male    DOB: 1926/01/08, 84 y.o.   MRN: [id number]  No chief complaint on file.   HPI Patient is in today for follow up and accompanied by wife and daughters. He is noting some constant sense of being cold but not a change. He is eating well. They are noting constipation now moving his bowels every 6-12 days instead of daily or qod. No bloody stool or pain. meds not working well. No fevers or chills. Denies CP/palp/SOB/HA/congestion/fevers or GU c/o. Taking meds as prescribed  Past Medical History:  Diagnosis Date  . 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 Allergy    seasonal  . Arthritis   . Cellulitis 09/20/2015  . CHF (congestive heart failure) (HCC)   . Complication of indwelling urinary catheter (HCC)    Suprapubic catheter in place s/p urologic surgery   . Depression   . Hyperglycemia 05/09/2015  . Low back pain 04/04/2015  . Neck fracture (HCC)   . Neuromuscular disorder (HCC)   . Obesity 04/04/2015  . Preventative health care 05/30/2017  . Urinary anomaly   . Urinary retention 09/21/2016  . Vitamin B12 deficiency 04/04/2015    Past Surgical History:  Procedure Laterality Date  . INSERTION OF SUPRAPUBIC CATHETER    . SURGERY SCROTAL / TESTICULAR Right     Family History  Problem Relation Age of Onset  . Cancer Sister     Social History   Socioeconomic History  . Marital status: Married    Spouse name: Not on file  . Number of children: Not on file  . Years of education: Not on file  . Highest education level: Not on file  Occupational History  . Occupation: retired  Engineer, production  . Financial resource strain: Not on file  . Food insecurity:    Worry: Not on file    Inability: Not on file  . Transportation needs:    Medical: Not on file    Non-medical: Not on file  Tobacco Use  . Smoking status: Never Smoker  . Smokeless tobacco: Never Used  Substance and Sexual Activity  . Alcohol use: No    Alcohol/week: 0.0 standard  drinks  . Drug use: No  . Sexual activity: Never  Lifestyle  . Physical activity:    Days per week: Not on file    Minutes per session: Not on file  . Stress: Not on file  Relationships  . Social connections:    Talks on phone: Not on file    Gets together: Not on file    Attends religious service: Not on file    Active member of club or organization: Not on file    Attends meetings of clubs or organizations: Not on file    Relationship status: Not on file  . Intimate partner violence:    Fear of current or ex partner: Not on file    Emotionally abused: Not on file    Physically abused: Not on file    Forced sexual activity: Not on file  Other Topics Concern  . Not on file  Social History Narrative  . Not on file    Outpatient Medications Prior to Visit  Medication Sig Dispense Refill  . bisacodyl (DULCOLAX) 5 MG EC tablet Take 5 mg by mouth daily as needed.    . cefdinir (OMNICEF) 300 MG capsule Take 1 capsule (300 mg total) by mouth 2 (two) times daily. 14 capsule 0  . cholecalciferol (VITAMIN  D) 1000 UNITS tablet Take 1,000 Units by mouth daily.    . cyanocobalamin (,VITAMIN B-12,) 1000 MCG/ML injection Inject 1 mL (1,000 mcg total) into the muscle every 30 (thirty) days. 10 mL 1  . furosemide (LASIX) 40 MG tablet TAKE 1 TABLET BY MOUTH ONCE DAILY 90 tablet 1  . gabapentin (NEURONTIN) 300 MG capsule TAKE 1 CAPSULE BY MOUTH THREE TIMES DAILY 90 capsule 1  . levothyroxine (SYNTHROID, LEVOTHROID) 25 MCG tablet Take 1 tablet (25 mcg total) by mouth daily before breakfast. 90 tablet 1  . memantine (NAMENDA) 5 MG tablet TAKE 1 TABLET BY MOUTH ONCE DAILY 90 tablet 1  . methadone (DOLOPHINE) 5 MG tablet Take 0.5 tablets (2.5 mg total) by mouth 3 (three) times daily as needed. 45 tablet 0  . nystatin cream (MYCOSTATIN) Apply 1 application topically 2 (two) times daily. 30 g 0  . oxybutynin (DITROPAN) 5 MG tablet Take 10 mg by mouth daily.     Marland Kitchen senna-docusate (SENOKOT-S) 8.6-50 MG  per tablet Take 4 tablets by mouth 2 (two) times daily. Only takes as needed.    . Syringe/Needle, Disp, (SYRINGE 3CC/25GX1") 25G X 1" 3 ML MISC To use with vitamin B12 50 each 1   No facility-administered medications prior to visit.     Allergies  Allergen Reactions  . Ciprofloxacin   . Levaquin [Levofloxacin In D5w] Other (See Comments)  . Doxycycline Hyclate Rash    Review of Systems  Constitutional: Negative for fever and malaise/fatigue.  HENT: Negative for congestion.   Eyes: Negative for blurred vision.  Respiratory: Negative for shortness of breath.   Cardiovascular: Negative for chest pain, palpitations and leg swelling.  Gastrointestinal: Negative for abdominal pain, blood in stool and nausea.  Genitourinary: Negative for dysuria and frequency.  Musculoskeletal: Negative for falls.  Skin: Negative for rash.  Neurological: Negative for dizziness, loss of consciousness and headaches.  Endo/Heme/Allergies: Negative for environmental allergies.  Psychiatric/Behavioral: Negative for depression. The patient is not nervous/anxious.        Objective:    Physical Exam  Constitutional: He is oriented to person, place, and time. He appears well-developed and well-nourished. No distress.  HENT:  Head: Normocephalic and atraumatic.  Nose: Nose normal.  Eyes: Right eye exhibits no discharge. Left eye exhibits no discharge.  Neck: Normal range of motion. Neck supple.  Cardiovascular: Normal rate and regular rhythm.  No murmur heard. Pulmonary/Chest: Effort normal and breath sounds normal.  Abdominal: Soft. Bowel sounds are normal. There is no tenderness.  Musculoskeletal: He exhibits no edema.  Neurological: He is alert and oriented to person, place, and time.  Skin: Skin is warm and dry.  Psychiatric: He has a normal mood and affect.  Nursing note and vitals reviewed.   BP (!) 92/54   Pulse 64   Temp 98.5 F (36.9 C) (Oral)   Resp 18   Wt 246 lb (111.6 kg)   SpO2  100%   BMI 34.44 kg/m  Wt Readings from Last 3 Encounters:  03/11/18 246 lb (111.6 kg)  08/13/17 246 lb (111.6 kg)  05/28/17 246 lb (111.6 kg)     Lab Results  Component Value Date   WBC 5.1 11/05/2017   HGB 11.2 (L) 11/05/2017   HCT 33.6 (L) 11/05/2017   PLT 162.0 11/05/2017   GLUCOSE 102 (H) 11/05/2017   CHOL 140 11/05/2017   TRIG 53.0 11/05/2017   HDL 45.40 11/05/2017   LDLCALC 84 11/05/2017   ALT 5 11/05/2017   AST  8 11/05/2017   NA 136 11/05/2017   K 4.5 11/05/2017   CL 100 11/05/2017   CREATININE 0.88 11/05/2017   BUN 17 11/05/2017   CO2 30 11/05/2017   TSH 4.14 11/05/2017   INR 1.1 05/17/2008   HGBA1C 5.8 11/05/2017    Lab Results  Component Value Date   TSH 4.14 11/05/2017   Lab Results  Component Value Date   WBC 5.1 11/05/2017   HGB 11.2 (L) 11/05/2017   HCT 33.6 (L) 11/05/2017   MCV 86.6 11/05/2017   PLT 162.0 11/05/2017   Lab Results  Component Value Date   NA 136 11/05/2017   K 4.5 11/05/2017   CO2 30 11/05/2017   GLUCOSE 102 (H) 11/05/2017   BUN 17 11/05/2017   CREATININE 0.88 11/05/2017   BILITOT 0.5 11/05/2017   ALKPHOS 46 11/05/2017   AST 8 11/05/2017   ALT 5 11/05/2017   PROT 6.6 11/05/2017   ALBUMIN 3.4 (L) 11/05/2017   CALCIUM 8.7 11/05/2017   GFR 86.43 11/05/2017   Lab Results  Component Value Date   CHOL 140 11/05/2017   Lab Results  Component Value Date   HDL 45.40 11/05/2017   Lab Results  Component Value Date   LDLCALC 84 11/05/2017   Lab Results  Component Value Date   TRIG 53.0 11/05/2017   Lab Results  Component Value Date   CHOLHDL 3 11/05/2017   Lab Results  Component Value Date   HGBA1C 5.8 11/05/2017       Assessment & Plan:   Problem List Items Addressed This Visit    CHF (congestive heart failure) (HCC)    No recent exacerbation      Neck fracture (HCC)    Using Methadone very low dose and this manages his symptoms      Hyperglycemia    hgba1c acceptable, minimize simple carbs.  Increase exercise as tolerated.       Pneumonia    Check CXR. Mild cough today      Anemia    Increase leafy greens, consider increased lean red meat and using cast iron cookware. Continue to monitor, report any concerns      Constipation    Miralax daily and Benefiber bid, they are requesting a prescription for Relistor will try and prescribe for PRN use start with 150 mg daily prn       Other Visit Diagnoses    Needs flu shot    -  Primary   Relevant Orders   Flu vaccine HIGH DOSE PF (Fluzone High dose) (Completed)   Cough       Relevant Orders   DG Chest 2 View      I am having Nathanial Rancher start on Methylnaltrexone Bromide. I am also having him maintain his bisacodyl, cholecalciferol, oxybutynin, senna-docusate, SYRINGE 3CC/25GX1", nystatin cream, cyanocobalamin, furosemide, levothyroxine, gabapentin, memantine, cefdinir, and methadone.  Meds ordered this encounter  Medications  . Methylnaltrexone Bromide (RELISTOR) 150 MG TABS    Sig: Take 1 tablet by mouth daily as needed.    Dispense:  30 tablet    Refill:  1     Danise Edge, MD

## 2018-03-11 NOTE — Assessment & Plan Note (Signed)
Miralax daily and Benefiber bid, they are requesting a prescription for Relistor will try and prescribe for PRN use start with 150 mg daily prn

## 2018-03-11 NOTE — Assessment & Plan Note (Signed)
No recent exacerbation 

## 2018-03-11 NOTE — Patient Instructions (Addendum)
Add a dose of Miralax daily and Benefiber twice daily   Cough, Adult Coughing is a reflex that clears your throat and your airways. Coughing helps to heal and protect your lungs. It is normal to cough occasionally, but a cough that happens with other symptoms or lasts a long time may be a sign of a condition that needs treatment. A cough may last only 2-3 weeks (acute), or it may last longer than 8 weeks (chronic). What are the causes? Coughing is commonly caused by:  Breathing in substances that irritate your lungs.  A viral or bacterial respiratory infection.  Allergies.  Asthma.  Postnasal drip.  Smoking.  Acid backing up from the stomach into the esophagus (gastroesophageal reflux).  Certain medicines.  Chronic lung problems, including COPD (or rarely, lung cancer).  Other medical conditions such as heart failure.  Follow these instructions at home: Pay attention to any changes in your symptoms. Take these actions to help with your discomfort:  Take medicines only as told by your health care provider. ? If you were prescribed an antibiotic medicine, take it as told by your health care provider. Do not stop taking the antibiotic even if you start to feel better. ? Talk with your health care provider before you take a cough suppressant medicine.  Drink enough fluid to keep your urine clear or pale yellow.  If the air is dry, use a cold steam vaporizer or humidifier in your bedroom or your home to help loosen secretions.  Avoid anything that causes you to cough at work or at home.  If your cough is worse at night, try sleeping in a semi-upright position.  Avoid cigarette smoke. If you smoke, quit smoking. If you need help quitting, ask your health care provider.  Avoid caffeine.  Avoid alcohol.  Rest as needed.  Contact a health care provider if:  You have new symptoms.  You cough up pus.  Your cough does not get better after 2-3 weeks, or your cough gets  worse.  You cannot control your cough with suppressant medicines and you are losing sleep.  You develop pain that is getting worse or pain that is not controlled with pain medicines.  You have a fever.  You have unexplained weight loss.  You have night sweats. Get help right away if:  You cough up blood.  You have difficulty breathing.  Your heartbeat is very fast. This information is not intended to replace advice given to you by your health care provider. Make sure you discuss any questions you have with your health care provider. Document Released: 12/16/2010 Document Revised: 11/25/2015 Document Reviewed: 08/26/2014 Elsevier Interactive Patient Education  [employer].

## 2018-03-12 ENCOUNTER — Other Ambulatory Visit: Payer: Self-pay | Admitting: Family Medicine

## 2018-03-12 DIAGNOSIS — F0390 Unspecified dementia without behavioral disturbance: Secondary | ICD-10-CM | POA: Diagnosis not present

## 2018-03-12 DIAGNOSIS — Z87440 Personal history of urinary (tract) infections: Secondary | ICD-10-CM | POA: Diagnosis not present

## 2018-03-12 DIAGNOSIS — Z993 Dependence on wheelchair: Secondary | ICD-10-CM | POA: Diagnosis not present

## 2018-03-12 DIAGNOSIS — N319 Neuromuscular dysfunction of bladder, unspecified: Secondary | ICD-10-CM | POA: Diagnosis not present

## 2018-03-12 DIAGNOSIS — Z8701 Personal history of pneumonia (recurrent): Secondary | ICD-10-CM | POA: Diagnosis not present

## 2018-03-12 DIAGNOSIS — R1310 Dysphagia, unspecified: Secondary | ICD-10-CM | POA: Diagnosis not present

## 2018-03-12 DIAGNOSIS — E039 Hypothyroidism, unspecified: Secondary | ICD-10-CM | POA: Diagnosis not present

## 2018-03-12 DIAGNOSIS — Z9350 Unspecified cystostomy status: Secondary | ICD-10-CM | POA: Diagnosis not present

## 2018-03-12 DIAGNOSIS — Z66 Do not resuscitate: Secondary | ICD-10-CM | POA: Diagnosis not present

## 2018-03-13 ENCOUNTER — Encounter: Payer: Self-pay | Admitting: Family Medicine

## 2018-03-21 DIAGNOSIS — Z993 Dependence on wheelchair: Secondary | ICD-10-CM | POA: Diagnosis not present

## 2018-03-21 DIAGNOSIS — N319 Neuromuscular dysfunction of bladder, unspecified: Secondary | ICD-10-CM | POA: Diagnosis not present

## 2018-03-21 DIAGNOSIS — R1310 Dysphagia, unspecified: Secondary | ICD-10-CM | POA: Diagnosis not present

## 2018-03-21 DIAGNOSIS — Z466 Encounter for fitting and adjustment of urinary device: Secondary | ICD-10-CM | POA: Diagnosis not present

## 2018-03-21 DIAGNOSIS — E039 Hypothyroidism, unspecified: Secondary | ICD-10-CM | POA: Diagnosis not present

## 2018-03-21 DIAGNOSIS — Z8701 Personal history of pneumonia (recurrent): Secondary | ICD-10-CM | POA: Diagnosis not present

## 2018-03-21 DIAGNOSIS — F0390 Unspecified dementia without behavioral disturbance: Secondary | ICD-10-CM | POA: Diagnosis not present

## 2018-03-21 DIAGNOSIS — Z66 Do not resuscitate: Secondary | ICD-10-CM | POA: Diagnosis not present

## 2018-03-21 DIAGNOSIS — Z9350 Unspecified cystostomy status: Secondary | ICD-10-CM | POA: Diagnosis not present

## 2018-03-21 DIAGNOSIS — Z87440 Personal history of urinary (tract) infections: Secondary | ICD-10-CM | POA: Diagnosis not present

## 2018-03-27 ENCOUNTER — Other Ambulatory Visit: Payer: Self-pay | Admitting: Family Medicine

## 2018-03-27 DIAGNOSIS — N319 Neuromuscular dysfunction of bladder, unspecified: Secondary | ICD-10-CM | POA: Diagnosis not present

## 2018-03-27 DIAGNOSIS — Z9350 Unspecified cystostomy status: Secondary | ICD-10-CM | POA: Diagnosis not present

## 2018-03-27 DIAGNOSIS — F0390 Unspecified dementia without behavioral disturbance: Secondary | ICD-10-CM | POA: Diagnosis not present

## 2018-03-27 DIAGNOSIS — E039 Hypothyroidism, unspecified: Secondary | ICD-10-CM | POA: Diagnosis not present

## 2018-03-27 DIAGNOSIS — Z66 Do not resuscitate: Secondary | ICD-10-CM | POA: Diagnosis not present

## 2018-03-27 DIAGNOSIS — Z87440 Personal history of urinary (tract) infections: Secondary | ICD-10-CM | POA: Diagnosis not present

## 2018-03-27 DIAGNOSIS — Z993 Dependence on wheelchair: Secondary | ICD-10-CM | POA: Diagnosis not present

## 2018-03-27 DIAGNOSIS — R1310 Dysphagia, unspecified: Secondary | ICD-10-CM | POA: Diagnosis not present

## 2018-03-27 DIAGNOSIS — Z8701 Personal history of pneumonia (recurrent): Secondary | ICD-10-CM | POA: Diagnosis not present

## 2018-04-04 ENCOUNTER — Encounter: Payer: Self-pay | Admitting: Family Medicine

## 2018-04-04 ENCOUNTER — Other Ambulatory Visit: Payer: Self-pay | Admitting: Family Medicine

## 2018-04-04 DIAGNOSIS — F411 Generalized anxiety disorder: Secondary | ICD-10-CM

## 2018-04-04 DIAGNOSIS — E538 Deficiency of other specified B group vitamins: Secondary | ICD-10-CM

## 2018-04-04 MED ORDER — METHADONE HCL 5 MG PO TABS: 2.5000 mg | ORAL_TABLET | Freq: Three times a day (TID) | ORAL | 0 refills | Status: DC | PRN

## 2018-04-12 ENCOUNTER — Telehealth: Payer: Self-pay | Admitting: *Deleted

## 2018-04-12 NOTE — Telephone Encounter (Signed)
Received Home Health Certification and Plan of Care; forwarded to provider/SLS 10/11  

## 2018-04-15 ENCOUNTER — Other Ambulatory Visit: Payer: Self-pay | Admitting: Family Medicine

## 2018-04-19 DIAGNOSIS — N319 Neuromuscular dysfunction of bladder, unspecified: Secondary | ICD-10-CM | POA: Diagnosis not present

## 2018-05-08 ENCOUNTER — Other Ambulatory Visit: Payer: Self-pay | Admitting: Family Medicine

## 2018-05-08 DIAGNOSIS — G894 Chronic pain syndrome: Secondary | ICD-10-CM

## 2018-05-15 ENCOUNTER — Other Ambulatory Visit: Payer: Self-pay | Admitting: Family Medicine

## 2018-05-15 ENCOUNTER — Encounter: Payer: Self-pay | Admitting: Family Medicine

## 2018-05-15 DIAGNOSIS — E538 Deficiency of other specified B group vitamins: Secondary | ICD-10-CM

## 2018-05-15 DIAGNOSIS — F411 Generalized anxiety disorder: Secondary | ICD-10-CM

## 2018-05-15 MED ORDER — METHADONE HCL 5 MG PO TABS: 2.5000 mg | ORAL_TABLET | Freq: Three times a day (TID) | ORAL | 0 refills | Status: AC | PRN

## 2018-05-16 DIAGNOSIS — Z466 Encounter for fitting and adjustment of urinary device: Secondary | ICD-10-CM | POA: Diagnosis not present

## 2018-05-16 DIAGNOSIS — N319 Neuromuscular dysfunction of bladder, unspecified: Secondary | ICD-10-CM | POA: Diagnosis not present

## 2018-05-23 ENCOUNTER — Telehealth: Payer: Self-pay

## 2018-05-23 NOTE — Telephone Encounter (Signed)
Completed; faxed with confirmation/SLS 11/21

## 2018-05-23 NOTE — Telephone Encounter (Signed)
Brandon calling to check on status of FL2 form, needed by tmr. Author Oliver Pilamessaged [name], but no response. Brandon provided number to which FL2 needs to be faxed: [phone] attn: [name]. Re-routed to [facility], Dr. Abner GreenspanBlyth, and [place].

## 2018-05-23 NOTE — Telephone Encounter (Signed)
Copied from CRM [phone]. Topic: General - Inquiry >> May 23, 2018 10:30 AM Crist InfanteHarrald, Brandon J wrote: Reason for CRM: Brandon Carrel[facility]illy [facility] to [facility] if you have [facility]. She states this is Urgent, as the wife has been admitted to the hospital, and she is the pt's primary care giver.  All family lives out of town, and pt is to be placed Sat morning. Brandon requesting a call back asap to advise if this can be down and they get back by tomorrow. Please call her back  [phone]  (ask for Brandon)

## 2018-05-24 NOTE — Telephone Encounter (Signed)
Completed update request, spoke with Lily on phone; faxed with confirmation/SLS 11/22

## 2018-05-24 NOTE — Telephone Encounter (Signed)
[facility], saying they need the FL2 form editted, and will be sending via fax shortly. Corrected form will need to be re-faxed to them by the end of today. Jasmine DecemberSharon and Dr. Abner GreenspanBlyth made aware.

## 2018-05-27 ENCOUNTER — Telehealth: Payer: Self-pay | Admitting: *Deleted

## 2018-05-27 NOTE — Telephone Encounter (Signed)
[facility] [place] [facility] [place]; forwarded to provider/SLS 11/25

## 2018-05-29 ENCOUNTER — Telehealth: Payer: Self-pay | Admitting: *Deleted

## 2018-05-29 DIAGNOSIS — N390 Urinary tract infection, site not specified: Secondary | ICD-10-CM | POA: Diagnosis not present

## 2018-05-29 NOTE — Telephone Encounter (Signed)
Received Physician Orders/ Individualized Service Plan Addendum from [facility]; forwarded to provider/SLS 11/27

## 2018-05-29 NOTE — Telephone Encounter (Signed)
Received Physician Orders [POS] from [facility]; forwarded to provider/SLS 11/27

## 2018-05-30 DIAGNOSIS — R52 Pain, unspecified: Secondary | ICD-10-CM | POA: Diagnosis not present

## 2018-05-30 DIAGNOSIS — J989 Respiratory disorder, unspecified: Secondary | ICD-10-CM | POA: Diagnosis not present

## 2018-05-30 DIAGNOSIS — B961 Klebsiella pneumoniae [K. pneumoniae] as the cause of diseases classified elsewhere: Secondary | ICD-10-CM | POA: Diagnosis not present

## 2018-05-30 DIAGNOSIS — I959 Hypotension, unspecified: Secondary | ICD-10-CM | POA: Diagnosis not present

## 2018-05-30 DIAGNOSIS — Y998 Other external cause status: Secondary | ICD-10-CM | POA: Diagnosis not present

## 2018-05-30 DIAGNOSIS — M2550 Pain in unspecified joint: Secondary | ICD-10-CM | POA: Diagnosis not present

## 2018-05-30 DIAGNOSIS — F0390 Unspecified dementia without behavioral disturbance: Secondary | ICD-10-CM | POA: Diagnosis not present

## 2018-05-30 DIAGNOSIS — T83510A Infection and inflammatory reaction due to cystostomy catheter, initial encounter: Secondary | ICD-10-CM | POA: Diagnosis not present

## 2018-05-30 DIAGNOSIS — X58XXXA Exposure to other specified factors, initial encounter: Secondary | ICD-10-CM | POA: Diagnosis not present

## 2018-05-30 DIAGNOSIS — Z7401 Bed confinement status: Secondary | ICD-10-CM | POA: Diagnosis not present

## 2018-05-30 DIAGNOSIS — N390 Urinary tract infection, site not specified: Secondary | ICD-10-CM | POA: Diagnosis not present

## 2018-05-30 DIAGNOSIS — K449 Diaphragmatic hernia without obstruction or gangrene: Secondary | ICD-10-CM | POA: Diagnosis not present

## 2018-05-30 DIAGNOSIS — K5900 Constipation, unspecified: Secondary | ICD-10-CM | POA: Diagnosis not present

## 2018-05-30 DIAGNOSIS — R509 Fever, unspecified: Secondary | ICD-10-CM | POA: Diagnosis not present

## 2018-05-30 DIAGNOSIS — R0902 Hypoxemia: Secondary | ICD-10-CM | POA: Diagnosis not present

## 2018-05-30 DIAGNOSIS — I10 Essential (primary) hypertension: Secondary | ICD-10-CM | POA: Diagnosis not present

## 2018-06-02 ENCOUNTER — Encounter (HOSPITAL_COMMUNITY): Payer: Self-pay

## 2018-06-02 ENCOUNTER — Emergency Department (HOSPITAL_COMMUNITY): Payer: Medicare HMO

## 2018-06-02 ENCOUNTER — Emergency Department (HOSPITAL_COMMUNITY)
Admission: EM | Admit: 2018-06-02 | Discharge: 2018-06-02 | Disposition: A | Discharge status: Home / Self Care | Payer: Medicare HMO | Attending: Emergency Medicine | Admitting: Emergency Medicine

## 2018-06-02 DIAGNOSIS — S199XXA Unspecified injury of neck, initial encounter: Secondary | ICD-10-CM | POA: Diagnosis not present

## 2018-06-02 DIAGNOSIS — W19XXXA Unspecified fall, initial encounter: Secondary | ICD-10-CM | POA: Diagnosis not present

## 2018-06-02 DIAGNOSIS — S3993XA Unspecified injury of pelvis, initial encounter: Secondary | ICD-10-CM | POA: Diagnosis not present

## 2018-06-02 DIAGNOSIS — I509 Heart failure, unspecified: Secondary | ICD-10-CM | POA: Insufficient documentation

## 2018-06-02 DIAGNOSIS — Z79899 Other long term (current) drug therapy: Secondary | ICD-10-CM | POA: Insufficient documentation

## 2018-06-02 DIAGNOSIS — Y92129 Unspecified place in nursing home as the place of occurrence of the external cause: Secondary | ICD-10-CM | POA: Insufficient documentation

## 2018-06-02 DIAGNOSIS — Y999 Unspecified external cause status: Secondary | ICD-10-CM | POA: Diagnosis not present

## 2018-06-02 DIAGNOSIS — F0390 Unspecified dementia without behavioral disturbance: Secondary | ICD-10-CM | POA: Diagnosis not present

## 2018-06-02 DIAGNOSIS — Z960 Presence of urogenital implants: Secondary | ICD-10-CM | POA: Diagnosis not present

## 2018-06-02 DIAGNOSIS — R220 Localized swelling, mass and lump, head: Secondary | ICD-10-CM | POA: Diagnosis not present

## 2018-06-02 DIAGNOSIS — E039 Hypothyroidism, unspecified: Secondary | ICD-10-CM | POA: Insufficient documentation

## 2018-06-02 DIAGNOSIS — M25551 Pain in right hip: Secondary | ICD-10-CM | POA: Diagnosis not present

## 2018-06-02 DIAGNOSIS — I10 Essential (primary) hypertension: Secondary | ICD-10-CM | POA: Diagnosis not present

## 2018-06-02 DIAGNOSIS — S299XXA Unspecified injury of thorax, initial encounter: Secondary | ICD-10-CM | POA: Diagnosis not present

## 2018-06-02 DIAGNOSIS — R29898 Other symptoms and signs involving the musculoskeletal system: Secondary | ICD-10-CM | POA: Diagnosis not present

## 2018-06-02 DIAGNOSIS — Z7401 Bed confinement status: Secondary | ICD-10-CM | POA: Diagnosis not present

## 2018-06-02 DIAGNOSIS — S0093XA Contusion of unspecified part of head, initial encounter: Secondary | ICD-10-CM | POA: Insufficient documentation

## 2018-06-02 DIAGNOSIS — Y939 Activity, unspecified: Secondary | ICD-10-CM | POA: Diagnosis not present

## 2018-06-02 DIAGNOSIS — S0990XA Unspecified injury of head, initial encounter: Secondary | ICD-10-CM | POA: Diagnosis not present

## 2018-06-02 DIAGNOSIS — M2550 Pain in unspecified joint: Secondary | ICD-10-CM | POA: Diagnosis not present

## 2018-06-02 NOTE — ED Notes (Signed)
Bed: WA04 Expected date:  Expected time:  Means of arrival:  Comments: EMS elderly fall with head injury

## 2018-06-02 NOTE — ED Triage Notes (Signed)
Patient presented to ed with c/o unwitnessed fall. Possible hit his head. Pt is currently not complaining of any thing at this time. Standing only with two person assist. Pt unable to ambulate and this is baseline. Pt have previous cervical fracture. Pt have hx of dementia and confused at baseline. Pt not on blood thinners.

## 2018-06-02 NOTE — Discharge Instructions (Addendum)
Patient with contusion to posterior scalp, no new findings otherwise.

## 2018-06-02 NOTE — ED Notes (Signed)
Patient transported to X-ray 

## 2018-06-02 NOTE — ED Notes (Signed)
Patient transported to CT 

## 2018-06-02 NOTE — ED Provider Notes (Signed)
[facility]-EMERGENCY DEPT Provider Note   CSN: [id number] Arrival date & time: 06/02/18  4098     History   Chief Complaint Chief Complaint  Patient presents with  . Fall    HPI Brandon Franklin is a 84 y.o. male.  LEVEL 5 CAVEAT DUE TO DEMENTIA. Per EMS, patient found on the floor this morning. Unknown how. Patient at his mental status baseline per care facility to EMS. Hx of neck injury in the past. Patient moving all extremities and follows commands. Patient in not on blood thinners. Patient was seen earlier in the night.   The history is provided by the EMS personnel.  Fall  This is a new problem. The current episode started 3 to 5 hours ago. The problem occurs constantly. The problem has not changed since onset.Pertinent negatives include no chest pain, no abdominal pain, no headaches and no shortness of breath. Nothing aggravates the symptoms. Nothing relieves the symptoms. He has tried nothing for the symptoms. The treatment provided no relief.    Past Medical History:  Diagnosis Date  . 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 Allergy    seasonal  . Arthritis   . Cellulitis 09/20/2015  . CHF (congestive heart failure) (HCC)   . Complication of indwelling urinary catheter (HCC)    Suprapubic catheter in place s/p urologic surgery   . Depression   . Hyperglycemia 05/09/2015  . Low back pain 04/04/2015  . Neck fracture (HCC)   . Neuromuscular disorder (HCC)   . Obesity 04/04/2015  . Preventative health care 05/30/2017  . Urinary anomaly   . Urinary retention 09/21/2016  . Vitamin B12 deficiency 04/04/2015    Patient Active Problem List   Diagnosis Date Noted  . Constipation 03/11/2018  . Anemia 11/05/2017  . Pneumonia 08/13/2017  . Preventative health care 05/30/2017  . Hypothyroid 05/28/2017  . Urinary retention 09/21/2016  . Anxiety state 08/21/2016  . 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 Hyperglycemia 05/09/2015  . Obesity 04/04/2015  . Low back pain  04/04/2015  . Vitamin B 12 deficiency 04/04/2015  . Allergy   . Arthritis   . CHF (congestive heart failure) (HCC)   . Complication of indwelling urinary catheter (HCC)   . Neck fracture [facility])     Past Surgical History:  Procedure Laterality Date  . INSERTION OF SUPRAPUBIC CATHETER    . SURGERY SCROTAL / TESTICULAR Right         Home Medications    Prior to Admission medications   Medication Sig Start Date End Date Taking? Authorizing Provider  cephALEXin (KEFLEX) 500 MG capsule Take 500 mg by mouth 4 (four) times daily. 05/30/18 06/06/18 Yes [provider]  cholecalciferol (VITAMIN D) 1000 UNITS tablet Take 1,000 Units by mouth daily.   Yes [provider]  famotidine (PEPCID) 40 MG tablet Take 40 mg by mouth daily.   Yes [provider]  gabapentin (NEURONTIN) 300 MG capsule TAKE 1 CAPSULE BY MOUTH THREE TIMES DAILY 05/08/18  Yes Bradd Canary, MD  levothyroxine (SYNTHROID, LEVOTHROID) 25 MCG tablet TAKE 1 TABLET BY MOUTH ONCE DAILY BEFORE BREAKFAST Patient taking differently: Take 25 mcg by mouth daily before breakfast.  04/15/18  Yes Bradd Canary, MD  memantine (NAMENDA) 5 MG tablet TAKE 1 TABLET BY MOUTH ONCE DAILY 01/15/18  Yes Bradd Canary, MD  oxybutynin (DITROPAN) 5 MG tablet Take 10 mg by mouth daily.    Yes [provider]  bisacodyl (DULCOLAX) 5 MG EC tablet Take  5 mg by mouth daily as needed.    [provider]  cefdinir (OMNICEF) 300 MG capsule Take 1 capsule (300 mg total) by mouth 2 (two) times daily. Patient not taking: Reported on 06/02/2018 01/21/18   Bradd Canary, MD  cyanocobalamin (,VITAMIN B-12,) 1000 MCG/ML injection Inject 1 mL (1,000 mcg total) into the muscle every 30 (thirty) days. Patient not taking: Reported on 06/02/2018 08/29/17   Bradd Canary, MD  furosemide (LASIX) 40 MG tablet Take 1 tablet (40 mg total) by mouth daily. Patient not taking: Reported on 06/02/2018 03/27/18   Bradd Canary, MD    methadone (DOLOPHINE) 5 MG tablet Take 0.5 tablets (2.5 mg total) by mouth 3 (three) times daily as needed. 05/15/18   Bradd Canary, MD  Methylnaltrexone Bromide (RELISTOR) 150 MG TABS Take 1 tablet by mouth daily as needed. 03/11/18   Bradd Canary, MD  nystatin cream (MYCOSTATIN) Apply 1 application topically 2 (two) times daily. 08/13/17   Bradd Canary, MD  senna-docusate (SENOKOT-S) 8.6-50 MG per tablet Take 4 tablets by mouth 2 (two) times daily. Only takes as needed.    [provider]  Syringe/Needle, Disp, (SYRINGE 3CC/25GX1") 25G X 1" 3 ML MISC To use with vitamin B12 07/13/15   Bradd Canary, MD    Family History Family History  Problem Relation Age of Onset  . Cancer Sister     Social History Social History   Tobacco Use  . Smoking status: Never Smoker  . Smokeless tobacco: Never Used  Substance Use Topics  . Alcohol use: No    Alcohol/week: 0.0 standard drinks  . Drug use: No     Allergies   Ciprofloxacin; Levaquin [levofloxacin in d5w]; Sulfamethoxazole-trimethoprim; and Doxycycline hyclate   Review of Systems Review of Systems  Unable to perform ROS: Dementia  Respiratory: Negative for shortness of breath.   Cardiovascular: Negative for chest pain.  Gastrointestinal: Negative for abdominal pain.  Neurological: Negative for headaches.     Physical Exam Updated Vital Signs  ED Triage Vitals  Enc Vitals Group     BP 06/02/18 0729 (!) 138/95     Pulse Rate 06/02/18 0729 60     Resp 06/02/18 0729 17     Temp 06/02/18 0729 98 F (36.7 C)     Temp Source 06/02/18 0729 Oral     SpO2 06/02/18 0729 93 %     Weight --      Height --      Head Circumference --      Peak Flow --      Pain Score 06/02/18 0724 0     Pain Loc --      Pain Edu? --      Excl. in GC? --     Physical Exam  Constitutional: He appears well-developed and well-nourished. No distress.  HENT:  Head: Normocephalic and atraumatic.  Mouth/Throat: No oropharyngeal  exudate.  Eyes: Pupils are equal, round, and reactive to light. Conjunctivae and EOM are normal.  Neck: Neck supple. No tracheal deviation present. No thyromegaly present.  Cardiovascular: Normal rate, regular rhythm, normal heart sounds and intact distal pulses.  Pulmonary/Chest: Effort normal and breath sounds normal. No stridor. No respiratory distress.  Abdominal: Soft. Bowel sounds are normal. He exhibits no distension.  Musculoskeletal: Normal range of motion. He exhibits no tenderness or deformity.  No midline spinal pain  Neurological: He is alert.  Moves all extremities, follows commands  Skin: Skin is warm  and dry.  Psychiatric: He has a normal mood and affect.     ED Treatments / Results  Labs (all labs ordered are listed, but only abnormal results are displayed) Labs Reviewed - No data to display  EKG None  Radiology Dg Chest 1 View  Result Date: 06/02/2018 CLINICAL DATA:  Fall, right hip pain, dementia EXAM: CHEST  1 VIEW COMPARISON:  05/30/2018 FINDINGS: Stable cardiomegaly with chronic basilar opacities compatible with atelectasis/scarring. Chronic small right pleural effusion. Upper lobes remain clear. No current CHF or edema. Trachea is midline. Aorta atherosclerotic. Degenerative changes of the spine. IMPRESSION: Stable cardiomegaly with chronic basilar opacities. No significant interval change. Electronically Signed   By: Judie PetitM.  Shick M.D.   On: 06/02/2018 08:33   Dg Pelvis 1-2 Views  Result Date: 06/02/2018 CLINICAL DATA:  Fall, right hip pain EXAM: PELVIS - 1-2 VIEW COMPARISON:  05/30/2018 FINDINGS: Bones are osteopenic. Advanced degenerative changes of the lower lumbar spine, SI joints and both hips. Bony pelvis and hips appear symmetric and intact. No malalignment or displaced fracture. No diastasis. IMPRESSION: Osteopenia and degenerative changes. No acute finding by plain radiography Electronically Signed   By: Judie PetitM.  Shick M.D.   On: 06/02/2018 08:31   Ct Head Wo  Contrast  Result Date: 06/02/2018 CLINICAL DATA:  Unwitnessed fall.  Initial encounter. EXAM: CT HEAD WITHOUT CONTRAST CT CERVICAL SPINE WITHOUT CONTRAST TECHNIQUE: Multidetector CT imaging of the head and cervical spine was performed following the standard protocol without intravenous contrast. Multiplanar CT image reconstructions of the cervical spine were also generated. COMPARISON:  Head CT 06/26/2017. Cervical spine radiographs 09/19/2017. Cervical spine CT 04/13/2009. 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 No acute infarct, intracranial hemorrhage, mass, midline shift, or extra-axial fluid collection is identified. There is moderate cerebral atrophy. Periventricular white matter hypodensities are unchanged and nonspecific but compatible with mild chronic small vessel ischemic disease. Vascular: Calcified atherosclerosis at the skull base. No hyperdense vessel. Skull: No fracture or focal osseous lesion. Sinuses/Orbits: Trace left sphenoid sinus fluid, decreased from prior. Small chronic focus of ossification dependently in the left sphenoid sinus. Visualized mastoid air cells are clear. Orbits are unremarkable. Other: Mild posterior scalp soft tissue swelling. CT CERVICAL SPINE FINDINGS Alignment: 3 mm anterolisthesis of C2 on C3, slightly increased from the prior CT. Unchanged grade 1 anterolisthesis of C7 on T1. Skull base and vertebrae: Remote odontoid fracture which has healed with slight anterior displacement relative to the body of C2. The dens is fused to the anterior arch of C1, and there is also ankylosis across the lateral C1-2 joints bilaterally. The posterior C1 ring and C2 lamina are also fused. No definite acute fracture is identified with assessment limited by mild to moderate motion artifact throughout the mid and lower cervical spine. Apparent irregularity and curvilinear lucency on  axial images through the right C4 lamina is attributed to degenerative changes and motion. Soft tissues and spinal canal: No prevertebral fluid or swelling. No visible canal hematoma. Disc levels: Severe disc space narrowing throughout the cervical spine with exception of C2-3. Interbody fusion at C3-4 and C5-6. Severe multilevel facet arthrosis. Widespread neural foraminal stenosis, severe bilaterally at C4-5. Moderate to severe spinal stenosis at C7-T1 due to anterolisthesis and spurring. Upper chest: Clear lung apices. Other: None. CLINICAL DATA:  Unwitnessed fall. Initial encounter. EXAM: CT HEAD WITHOUT CONTRAST CT CERVICAL SPINE WITHOUT CONTRAST TECHNIQUE: Multidetector CT imaging of the head  and cervical spine was performed following the standard protocol without intravenous contrast. Multiplanar CT image reconstructions of the cervical spine were also generated. COMPARISON:  Head CT 06/26/2017. Cervical spine radiographs 09/19/2017. Cervical spine CT 04/13/2009. 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 No acute infarct, intracranial hemorrhage, mass, midline shift, or extra-axial fluid collection is identified. There is moderate cerebral atrophy. Periventricular white matter hypodensities are unchanged and nonspecific but compatible with mild chronic small vessel ischemic disease. Vascular: Calcified atherosclerosis at the skull base. No hyperdense vessel. Skull: No fracture or focal osseous lesion. Sinuses/Orbits: Trace left sphenoid sinus fluid, decreased from prior. Small chronic focus of ossification dependently in the left sphenoid sinus. Visualized mastoid air cells are clear. Orbits are unremarkable. Other: Mild posterior scalp soft tissue swelling. CT CERVICAL SPINE FINDINGS Alignment: 3 mm anterolisthesis of C2 on C3, slightly increased from the prior CT. Unchanged grade 1 anterolisthesis of C7 on  T1. Skull base and vertebrae: Remote odontoid fracture which has healed with slight anterior displacement relative to the body of C2. The dens is fused to the anterior arch of C1, and there is also ankylosis across the lateral C1-2 joints bilaterally. The posterior C1 ring and C2 lamina are also fused. No definite acute fracture is identified with assessment limited by mild to moderate motion artifact throughout the mid and lower cervical spine. Apparent irregularity and curvilinear lucency on axial images through the right C4 lamina is attributed to degenerative changes and motion. Soft tissues and spinal canal: No prevertebral fluid or swelling. No visible canal hematoma. Disc levels: Severe disc space narrowing throughout the cervical spine with exception of C2-3. Interbody fusion at C3-4 and C5-6. Severe multilevel facet arthrosis. Widespread neural foraminal stenosis, severe bilaterally at C4-5. Moderate to severe spinal stenosis at C7-T1 due to anterolisthesis and spurring. Upper chest: Clear lung apices. Other: None. IMPRESSION: 1. No evidence of acute intracranial abnormality. 2. Mild posterior scalp soft tissue swelling. 3. Chronic ischemic changes with old infarcts as above. 4. No acute fracture identified in the cervical spine within limitations of motion artifact. 5. 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 6. Severe diffuse cervical disc and facet degeneration. Electronically Signed   By: Sebastian Ache M.D.   On: 06/02/2018 10:11   Ct Cervical Spine Wo Contrast  Result Date: 06/02/2018 CLINICAL DATA:  Unwitnessed fall.  Initial encounter. EXAM: CT HEAD WITHOUT CONTRAST CT CERVICAL SPINE WITHOUT CONTRAST TECHNIQUE: Multidetector CT imaging of the head and cervical spine was performed following the standard protocol without intravenous contrast. Multiplanar CT image reconstructions of the cervical spine were also generated. COMPARISON:  Head CT 06/26/2017. Cervical spine radiographs 09/19/2017.  Cervical spine CT 04/13/2009. 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 No acute infarct, intracranial hemorrhage, mass, midline shift, or extra-axial fluid collection is identified. There is moderate cerebral atrophy. Periventricular white matter hypodensities are unchanged and nonspecific but compatible with mild chronic small vessel ischemic disease. Vascular: Calcified atherosclerosis at the skull base. No hyperdense vessel. Skull: No fracture or focal osseous lesion. Sinuses/Orbits: Trace left sphenoid sinus fluid, decreased from prior. Small chronic focus of ossification dependently in the left sphenoid sinus. Visualized mastoid air cells are clear. Orbits are unremarkable. Other: Mild posterior scalp soft tissue swelling. CT CERVICAL SPINE FINDINGS Alignment: 3 mm anterolisthesis of C2 on C3, slightly increased from the prior CT. Unchanged  grade 1 anterolisthesis of C7 on T1. Skull base and vertebrae: Remote odontoid fracture which has healed with slight anterior displacement relative to the body of C2. The dens is fused to the anterior arch of C1, and there is also ankylosis across the lateral C1-2 joints bilaterally. The posterior C1 ring and C2 lamina are also fused. No definite acute fracture is identified with assessment limited by mild to moderate motion artifact throughout the mid and lower cervical spine. Apparent irregularity and curvilinear lucency on axial images through the right C4 lamina is attributed to degenerative changes and motion. Soft tissues and spinal canal: No prevertebral fluid or swelling. No visible canal hematoma. Disc levels: Severe disc space narrowing throughout the cervical spine with exception of C2-3. Interbody fusion at C3-4 and C5-6. Severe multilevel facet arthrosis. Widespread neural foraminal stenosis, severe bilaterally at C4-5. Moderate to severe spinal stenosis at  C7-T1 due to anterolisthesis and spurring. Upper chest: Clear lung apices. Other: None. CLINICAL DATA:  Unwitnessed fall. Initial encounter. EXAM: CT HEAD WITHOUT CONTRAST CT CERVICAL SPINE WITHOUT CONTRAST TECHNIQUE: Multidetector CT imaging of the head and cervical spine was performed following the standard protocol without intravenous contrast. Multiplanar CT image reconstructions of the cervical spine were also generated. COMPARISON:  Head CT 06/26/2017. Cervical spine radiographs 09/19/2017. Cervical spine CT 04/13/2009. 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 No acute infarct, intracranial hemorrhage, mass, midline shift, or extra-axial fluid collection is identified. There is moderate cerebral atrophy. Periventricular white matter hypodensities are unchanged and nonspecific but compatible with mild chronic small vessel ischemic disease. Vascular: Calcified atherosclerosis at the skull base. No hyperdense vessel. Skull: No fracture or focal osseous lesion. Sinuses/Orbits: Trace left sphenoid sinus fluid, decreased from prior. Small chronic focus of ossification dependently in the left sphenoid sinus. Visualized mastoid air cells are clear. Orbits are unremarkable. Other: Mild posterior scalp soft tissue swelling. CT CERVICAL SPINE FINDINGS Alignment: 3 mm anterolisthesis of C2 on C3, slightly increased from the prior CT. Unchanged grade 1 anterolisthesis of C7 on T1. Skull base and vertebrae: Remote odontoid fracture which has healed with slight anterior displacement relative to the body of C2. The dens is fused to the anterior arch of C1, and there is also ankylosis across the lateral C1-2 joints bilaterally. The posterior C1 ring and C2 lamina are also fused. No definite acute fracture is identified with assessment limited by mild to moderate motion artifact throughout the mid and lower cervical spine.  Apparent irregularity and curvilinear lucency on axial images through the right C4 lamina is attributed to degenerative changes and motion. Soft tissues and spinal canal: No prevertebral fluid or swelling. No visible canal hematoma. Disc levels: Severe disc space narrowing throughout the cervical spine with exception of C2-3. Interbody fusion at C3-4 and C5-6. Severe multilevel facet arthrosis. Widespread neural foraminal stenosis, severe bilaterally at C4-5. Moderate to severe spinal stenosis at C7-T1 due to anterolisthesis and spurring. Upper chest: Clear lung apices. Other: None. IMPRESSION: 1. No evidence of acute intracranial abnormality. 2. Mild posterior scalp soft tissue swelling. 3. Chronic ischemic changes with old infarcts as above. 4. No acute fracture identified in the cervical spine within limitations of motion artifact. 5. 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 6. Severe diffuse cervical disc and facet degeneration. Electronically Signed   By: Sebastian Ache M.D.   On: 06/02/2018 10:11    Procedures Procedures (including critical care time)  Medications Ordered  in ED Medications - No data to display   Initial Impression / Assessment and Plan / ED Course  I have reviewed the triage vital signs and the nursing notes.  Pertinent labs & imaging results that were available during my care of the patient were reviewed by me and considered in my medical decision making (see chart for details).     Brandon Franklin is a 84 year old male with history of heart failure, dementia who presents to the ED with fall.  Patient with normal vitals.  No fever.  Patient according to EMS is at his baseline.  He follows commands.  Has a history of chronic odontoid fracture.  Patient moves all of his extremities.  Appears neurologically at his baseline.  CT of his head and neck were overall unremarkable.  Has some posterior scalp swelling consistent with a head contusion.  No new fractures or intracranial  process seen on imaging.  Chest x-ray and pelvic x-ray unremarkable.  Patient discharged back to facility.  Final Clinical Impressions(s) / ED Diagnoses   Final diagnoses:  Contusion of head, unspecified part of head, initial encounter    ED Discharge Orders    None       Virgina Norfolk, DO 06/02/18 1020

## 2018-06-02 NOTE — ED Notes (Signed)
Report given to [facility] regarding patient coming back to the facility.

## 2018-06-02 NOTE — ED Notes (Signed)
[facility] call for transport. 

## 2018-06-04 ENCOUNTER — Telehealth: Payer: Self-pay

## 2018-06-04 ENCOUNTER — Telehealth: Payer: Self-pay | Admitting: *Deleted

## 2018-06-04 NOTE — Telephone Encounter (Signed)
Just an FYI

## 2018-06-04 NOTE — Telephone Encounter (Signed)
Received Physician Orders from [facility], with face sheet and last OV attached; forwarded to provider/SLS 12/03

## 2018-06-04 NOTE — Telephone Encounter (Signed)
Copied from CRM [id number]. Topic: Quick Communication - See Telephone Encounter >> Jun 04, 2018  3:04 PM Lorayne BenderAlexander, Amber L wrote: CRM for notification. See Telephone encounter for: 06/04/18.  Bubba HalesKeecia from [facility] calling to report they will be going to see pt for the first time on 12/11 - that is when cath will need to be changed.

## 2018-06-05 ENCOUNTER — Telehealth: Payer: Self-pay | Admitting: *Deleted

## 2018-06-05 NOTE — Telephone Encounter (Addendum)
[facility]; forwarded to provider/SLS 12/04

## 2018-06-06 ENCOUNTER — Encounter (HOSPITAL_COMMUNITY): Payer: Self-pay | Admitting: Emergency Medicine

## 2018-06-06 ENCOUNTER — Other Ambulatory Visit: Payer: Self-pay

## 2018-06-06 ENCOUNTER — Emergency Department (HOSPITAL_COMMUNITY): Payer: Medicare HMO

## 2018-06-06 ENCOUNTER — Emergency Department (HOSPITAL_COMMUNITY)
Admission: EM | Admit: 2018-06-06 | Discharge: 2018-06-06 | Disposition: A | Discharge status: Home / Self Care | Payer: Medicare HMO | Attending: Emergency Medicine | Admitting: Emergency Medicine

## 2018-06-06 DIAGNOSIS — R52 Pain, unspecified: Secondary | ICD-10-CM | POA: Diagnosis not present

## 2018-06-06 DIAGNOSIS — M25511 Pain in right shoulder: Secondary | ICD-10-CM | POA: Insufficient documentation

## 2018-06-06 DIAGNOSIS — S8001XA Contusion of right knee, initial encounter: Secondary | ICD-10-CM | POA: Insufficient documentation

## 2018-06-06 DIAGNOSIS — S0083XA Contusion of other part of head, initial encounter: Secondary | ICD-10-CM | POA: Diagnosis not present

## 2018-06-06 DIAGNOSIS — S43004A Unspecified dislocation of right shoulder joint, initial encounter: Secondary | ICD-10-CM | POA: Diagnosis not present

## 2018-06-06 DIAGNOSIS — S4991XA Unspecified injury of right shoulder and upper arm, initial encounter: Secondary | ICD-10-CM | POA: Diagnosis not present

## 2018-06-06 DIAGNOSIS — S0003XA Contusion of scalp, initial encounter: Secondary | ICD-10-CM | POA: Insufficient documentation

## 2018-06-06 DIAGNOSIS — Y939 Activity, unspecified: Secondary | ICD-10-CM | POA: Diagnosis not present

## 2018-06-06 DIAGNOSIS — Y999 Unspecified external cause status: Secondary | ICD-10-CM | POA: Insufficient documentation

## 2018-06-06 DIAGNOSIS — Y92129 Unspecified place in nursing home as the place of occurrence of the external cause: Secondary | ICD-10-CM | POA: Insufficient documentation

## 2018-06-06 DIAGNOSIS — M25551 Pain in right hip: Secondary | ICD-10-CM | POA: Diagnosis not present

## 2018-06-06 DIAGNOSIS — M5489 Other dorsalgia: Secondary | ICD-10-CM | POA: Diagnosis not present

## 2018-06-06 DIAGNOSIS — S299XXA Unspecified injury of thorax, initial encounter: Secondary | ICD-10-CM | POA: Diagnosis not present

## 2018-06-06 DIAGNOSIS — W1830XA Fall on same level, unspecified, initial encounter: Secondary | ICD-10-CM | POA: Insufficient documentation

## 2018-06-06 DIAGNOSIS — W19XXXA Unspecified fall, initial encounter: Secondary | ICD-10-CM

## 2018-06-06 DIAGNOSIS — Z7401 Bed confinement status: Secondary | ICD-10-CM | POA: Diagnosis not present

## 2018-06-06 DIAGNOSIS — M79631 Pain in right forearm: Secondary | ICD-10-CM | POA: Diagnosis not present

## 2018-06-06 DIAGNOSIS — I959 Hypotension, unspecified: Secondary | ICD-10-CM | POA: Diagnosis not present

## 2018-06-06 DIAGNOSIS — I509 Heart failure, unspecified: Secondary | ICD-10-CM | POA: Insufficient documentation

## 2018-06-06 DIAGNOSIS — Z79899 Other long term (current) drug therapy: Secondary | ICD-10-CM | POA: Diagnosis not present

## 2018-06-06 DIAGNOSIS — I10 Essential (primary) hypertension: Secondary | ICD-10-CM | POA: Diagnosis not present

## 2018-06-06 DIAGNOSIS — S199XXA Unspecified injury of neck, initial encounter: Secondary | ICD-10-CM | POA: Diagnosis not present

## 2018-06-06 DIAGNOSIS — R0902 Hypoxemia: Secondary | ICD-10-CM | POA: Diagnosis not present

## 2018-06-06 DIAGNOSIS — M2550 Pain in unspecified joint: Secondary | ICD-10-CM | POA: Diagnosis not present

## 2018-06-06 DIAGNOSIS — S8991XA Unspecified injury of right lower leg, initial encounter: Secondary | ICD-10-CM | POA: Diagnosis not present

## 2018-06-06 DIAGNOSIS — F0390 Unspecified dementia without behavioral disturbance: Secondary | ICD-10-CM | POA: Diagnosis not present

## 2018-06-06 DIAGNOSIS — S59911A Unspecified injury of right forearm, initial encounter: Secondary | ICD-10-CM | POA: Diagnosis not present

## 2018-06-06 DIAGNOSIS — S0990XA Unspecified injury of head, initial encounter: Secondary | ICD-10-CM | POA: Diagnosis not present

## 2018-06-06 DIAGNOSIS — S79911A Unspecified injury of right hip, initial encounter: Secondary | ICD-10-CM | POA: Diagnosis not present

## 2018-06-06 LAB — CBC WITH DIFFERENTIAL/PLATELET
Abs Immature Granulocytes: 0.08 10*3/uL — ABNORMAL HIGH (ref 0.00–0.07)
Basophils Absolute: 0 10*3/uL (ref 0.0–0.1)
Basophils Relative: 0 %
Eosinophils Absolute: 0 10*3/uL (ref 0.0–0.5)
Eosinophils Relative: 0 %
HCT: 38.6 % — ABNORMAL LOW (ref 39.0–52.0)
Hemoglobin: 12.1 g/dL — ABNORMAL LOW (ref 13.0–17.0)
IMMATURE GRANULOCYTES: 1 %
LYMPHS PCT: 6 %
Lymphs Abs: 0.6 10*3/uL — ABNORMAL LOW (ref 0.7–4.0)
MCH: 29.2 pg (ref 26.0–34.0)
MCHC: 31.3 g/dL (ref 30.0–36.0)
MCV: 93.2 fL (ref 80.0–100.0)
MONOS PCT: 9 %
Monocytes Absolute: 1 10*3/uL (ref 0.1–1.0)
Neutro Abs: 8.8 10*3/uL — ABNORMAL HIGH (ref 1.7–7.7)
Neutrophils Relative %: 84 %
Platelets: 176 10*3/uL (ref 150–400)
RBC: 4.14 MIL/uL — ABNORMAL LOW (ref 4.22–5.81)
RDW: 14.3 % (ref 11.5–15.5)
WBC: 10.6 10*3/uL — ABNORMAL HIGH (ref 4.0–10.5)
nRBC: 0 % (ref 0.0–0.2)

## 2018-06-06 LAB — BASIC METABOLIC PANEL
ANION GAP: 11 (ref 5–15)
BUN: 27 mg/dL — ABNORMAL HIGH (ref 8–23)
CO2: 26 mmol/L (ref 22–32)
Calcium: 8.5 mg/dL — ABNORMAL LOW (ref 8.9–10.3)
Chloride: 105 mmol/L (ref 98–111)
Creatinine, Ser: 1.15 mg/dL (ref 0.61–1.24)
GFR calc Af Amer: >60 mL/min (ref >60)
GFR calc non Af Amer: 56 mL/min — ABNORMAL LOW (ref >60)
Glucose, Bld: 140 mg/dL — ABNORMAL HIGH (ref 70–99)
POTASSIUM: 3.4 mmol/L — AB (ref 3.5–5.1)
Sodium: 142 mmol/L (ref 135–145)

## 2018-06-06 NOTE — ED Notes (Signed)
Bed: [id number] Expected date:  Expected time:  Means of arrival:  Comments: 72M fall from Nursing home

## 2018-06-06 NOTE — ED Triage Notes (Signed)
Pt is brought in by EMS from [facility] for evaluation after his unwitnessed fall.  Pt was found lying on floor beside his bed this morning.  Pt is a very poor historian--- has dementia.  Per nursing staff, pt exhibits no change in level of consciousness or mentation.  Pt sustained abrasion/skin tear to right forehead and right cheek--- bleeding controlled to affected areas.

## 2018-06-06 NOTE — Discharge Instructions (Signed)
Evaluated today for fall. Labs and imaging were negative. Please follow up with PCP for re-evaluation.  Return to the ED with any new or worsening symptoms.

## 2018-06-06 NOTE — ED Notes (Addendum)
[facility] Called for transport 

## 2018-06-06 NOTE — ED Notes (Signed)
This Clinical research associatewriter tried twice to obtain the emergency contact's phone number for this patient from [facility].

## 2018-06-06 NOTE — ED Notes (Signed)
Patient transported to CT 

## 2018-06-06 NOTE — ED Provider Notes (Signed)
[facility]-EMERGENCY DEPT Provider Note   CSN: [id number] Arrival date & time: 06/06/18  4098   History   Chief Complaint Chief Complaint  Patient presents with  . Fall    HPI Brandon Franklin is a 84 y.o. male with medical history significant for CHF, chronic odontoid neck fracture, neuromuscular disorder chronic low back pain who presents for evaluation after fall.  Per EMS, patient was found on the floor this morning.  Unknown how patient fell.  Per EMS facility states patient is at his baseline mental status.  Does have history of neck fracture from neck injury in the past.  Patient is not on any blood thinners.  Denies chest pain, abdominal pain, headaches, shortness of breath.  Level 5 caveat due to dementia.   HPI  Past Medical History:  Diagnosis Date  . 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 Allergy    seasonal  . Arthritis   . Cellulitis 09/20/2015  . CHF (congestive heart failure) (HCC)   . Complication of indwelling urinary catheter (HCC)    Suprapubic catheter in place s/p urologic surgery   . Depression   . Hyperglycemia 05/09/2015  . Low back pain 04/04/2015  . Neck fracture (HCC)   . Neuromuscular disorder (HCC)   . Obesity 04/04/2015  . Preventative health care 05/30/2017  . Urinary anomaly   . Urinary retention 09/21/2016  . Vitamin B12 deficiency 04/04/2015    Patient Active Problem List   Diagnosis Date Noted  . Constipation 03/11/2018  . Anemia 11/05/2017  . Pneumonia 08/13/2017  . Preventative health care 05/30/2017  . Hypothyroid 05/28/2017  . Urinary retention 09/21/2016  . Anxiety state 08/21/2016  . 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 Hyperglycemia 05/09/2015  . Obesity 04/04/2015  . Low back pain 04/04/2015  . Vitamin B 12 deficiency 04/04/2015  . Allergy   . Arthritis   . CHF (congestive heart failure) (HCC)   . Complication of indwelling urinary catheter (HCC)   . Neck fracture [facility])     Past Surgical History:  Procedure  Laterality Date  . INSERTION OF SUPRAPUBIC CATHETER    . SURGERY SCROTAL / TESTICULAR Right         Home Medications    Prior to Admission medications   Medication Sig Start Date End Date Taking? Authorizing Provider  bisacodyl (DULCOLAX) 5 MG EC tablet Take 5 mg by mouth daily as needed for mild constipation.    Yes [provider]  cephALEXin (KEFLEX) 500 MG capsule Take 500 mg by mouth 4 (four) times daily. 05/30/18 06/06/18 Yes [provider]  cholecalciferol (VITAMIN D) 1000 UNITS tablet Take 1,000 Units by mouth daily.   Yes [provider]  furosemide (LASIX) 40 MG tablet Take 1 tablet (40 mg total) by mouth daily. 03/27/18  Yes Bradd Canary, MD  gabapentin (NEURONTIN) 300 MG capsule TAKE 1 CAPSULE BY MOUTH THREE TIMES DAILY Patient taking differently: Take 300 mg by mouth 3 (three) times daily.  05/08/18  Yes Bradd Canary, MD  levothyroxine (SYNTHROID, LEVOTHROID) 25 MCG tablet TAKE 1 TABLET BY MOUTH ONCE DAILY BEFORE BREAKFAST Patient taking differently: Take 25 mcg by mouth daily before breakfast.  04/15/18  Yes Bradd Canary, MD  memantine (NAMENDA) 5 MG tablet TAKE 1 TABLET BY MOUTH ONCE DAILY Patient taking differently: Take 5 mg by mouth daily.  01/15/18  Yes Bradd Canary, MD  methadone (DOLOPHINE) 5 MG tablet Take 0.5 tablets (2.5 mg total) by mouth 3 (three) times  daily as needed. 05/15/18  Yes Bradd Canary, MD  Methylnaltrexone Bromide (RELISTOR) 150 MG TABS Take 1 tablet by mouth daily as needed. Patient taking differently: Take 150 mg by mouth daily as needed (constipation).  03/11/18  Yes Bradd Canary, MD  nystatin cream (MYCOSTATIN) Apply 1 application topically 2 (two) times daily. 08/13/17  Yes Bradd Canary, MD  oxybutynin (DITROPAN) 5 MG tablet Take 10 mg by mouth daily.    Yes [provider]  senna-docusate (SENOKOT-S) 8.6-50 MG per tablet Take 4 tablets by mouth 2 (two) times daily as needed for mild constipation.     Yes [provider]  cefdinir (OMNICEF) 300 MG capsule Take 1 capsule (300 mg total) by mouth 2 (two) times daily. Patient not taking: Reported on 06/02/2018 01/21/18   Bradd Canary, MD  cyanocobalamin (,VITAMIN B-12,) 1000 MCG/ML injection Inject 1 mL (1,000 mcg total) into the muscle every 30 (thirty) days. Patient not taking: Reported on 06/02/2018 08/29/17   Bradd Canary, MD  Syringe/Needle, Disp, (SYRINGE 3CC/25GX1") 25G X 1" 3 ML MISC To use with vitamin B12 07/13/15   Bradd Canary, MD    Family History Family History  Problem Relation Age of Onset  . Cancer Sister     Social History Social History   Tobacco Use  . Smoking status: Never Smoker  . Smokeless tobacco: Never Used  Substance Use Topics  . Alcohol use: No    Alcohol/week: 0.0 standard drinks  . Drug use: No     Allergies   Ciprofloxacin; Levaquin [levofloxacin in d5w]; Sulfamethoxazole-trimethoprim; and Doxycycline hyclate   Review of Systems Review of Systems  Unable to perform ROS: Dementia     Physical Exam Updated Vital Signs BP 116/75   Pulse (!) 59   Temp 97.9 F (36.6 C) (Oral)   Resp 16   Wt 111.1 kg   SpO2 98%   BMI 34.30 kg/m   Physical Exam  Constitutional: Vital signs are normal.  Non-toxic appearance. He does not have a sickly appearance. No distress.  HENT:  Head: Head is with contusion. Head is without raccoon's eyes, without Battle's sign, without laceration, without right periorbital erythema and without left periorbital erythema. Hair is normal.    Right Ear: Tympanic membrane, external ear and ear canal normal. Tympanic membrane is not perforated, not erythematous, not retracted and not bulging. No hemotympanum.  Left Ear: External ear and ear canal normal. Tympanic membrane is not perforated, not erythematous, not retracted and not bulging. No hemotympanum.  Nose: Nose normal.  Mouth/Throat: Oropharynx is clear and moist and mucous membranes are normal.    Eyes: Pupils are equal, round, and reactive to light.  EOM intact. No evidence of traumatic hyphema  Neck: Normal range of motion and full passive range of motion without pain. Neck supple. No neck rigidity. No edema, no erythema and normal range of motion present.  Cardiovascular: Normal rate, regular rhythm, normal heart sounds, intact distal pulses and normal pulses.  Pulmonary/Chest: Effort normal and breath sounds normal. No accessory muscle usage or stridor. No tachypnea. No respiratory distress. He has no decreased breath sounds. He has no wheezes. He has no rhonchi. He has no rales. He exhibits no mass, no tenderness, no crepitus, no edema, no deformity and no swelling.  Abdominal: Soft. Normal appearance and bowel sounds are normal. He exhibits no distension, no pulsatile liver, no fluid wave, no abdominal bruit, no ascites, no pulsatile midline mass and no mass. There  is no tenderness. There is no rigidity, no rebound and no guarding.  Musculoskeletal: Normal range of motion.       Right shoulder: He exhibits tenderness. He exhibits normal range of motion, no bony tenderness, no swelling, no effusion, no crepitus, no deformity and no laceration.       Right elbow: Normal.      Right wrist: Normal.       Right hip: He exhibits normal range of motion, no swelling, no crepitus, no deformity and no laceration.       Left hip: He exhibits normal range of motion, no tenderness, no swelling, no crepitus and no deformity.       Right knee: He exhibits normal range of motion, no swelling and no effusion.  Moves all extremities without difficulty or ataxia.  No midline neck or midline back tenderness.  Tenderness to palpation over right knee, right shoulder and right forearm. Right knee with contusion to lateral surface.  Neurological: He is alert.  Approximately 4 cm superficial skin tear to right temporal region of head. Hematoma noted to area. Ecchymosis to right lateral forearm in a 3cm rounded  area. 3cm skin tear to right lateral lower extremity to proximal tib/fib.  Skin: Skin is warm and dry. He is not diaphoretic.  1+ edema to BLE.  Psychiatric: He has a normal mood and affect.  Nursing note and vitals reviewed.        ED Treatments / Results  Labs (all labs ordered are listed, but only abnormal results are displayed) Labs Reviewed  CBC WITH DIFFERENTIAL/PLATELET - Abnormal; Notable for the following components:      Result Value   WBC 10.6 (*)    RBC 4.14 (*)    Hemoglobin 12.1 (*)    HCT 38.6 (*)    Neutro Abs 8.8 (*)    Lymphs Abs 0.6 (*)    Abs Immature Granulocytes 0.08 (*)    All other components within normal limits  BASIC METABOLIC PANEL - Abnormal; Notable for the following components:   Potassium 3.4 (*)    Glucose, Bld 140 (*)    BUN 27 (*)    Calcium 8.5 (*)    GFR calc non Af Amer 56 (*)    All other components within normal limits    EKG None  Radiology Dg Chest 1 View  Result Date: 06/06/2018 CLINICAL DATA:  Patient found down today. EXAM: CHEST  1 VIEW COMPARISON:  Single-view of the chest 06/02/2018 and 05/30/2018. FINDINGS: There is cardiomegaly. New small right pleural effusion and basilar airspace disease are seen. Hiatal hernia is noted. No pneumothorax. IMPRESSION: Small right pleural effusion and basilar airspace disease which could be atelectasis or pneumonia. Negative for pneumothorax or acute bony abnormality. Cardiomegaly. Hiatal hernia. Electronically Signed   By: Drusilla Kanner M.D.   On: 06/06/2018 09:29   Dg Shoulder Right  Result Date: 06/06/2018 CLINICAL DATA:  Followup right shoulder reduction. EXAM: RIGHT SHOULDER - 2+ VIEW COMPARISON:  Earlier same day. FINDINGS: Again, positioning is suboptimal, essentially with one-view, best characterized as a transscapular Y-view. The humeral head appears to relate fairly normally to the glenoid, possibly with mild inferior subluxation. No evidence of regional fracture. IMPRESSION:  Again there is a limited and suboptimal number of images and positioning. The humeral head does appear to be in normal relation to the glenoid, except for possible mild inferior subluxation. Electronically Signed   By: Paulina Fusi M.D.   On: 06/06/2018 10:51  Dg Shoulder Right  Result Date: 06/06/2018 CLINICAL DATA:  Unwitnessed fall.  Found on the floor.  Dementia. EXAM: RIGHT SHOULDER - 2+ VIEW COMPARISON:  None. FINDINGS: Humeral head shows some inferior subluxation relative to the glenoid. Limited views do not allow me to completely exclude inferior subluxation/dislocation. No evidence of regional fracture. Consider additional imaging when able. IMPRESSION: Limited evaluation. Essentially one projection obtained. Humeral head appears subluxed or possibly dislocated inferiorly. Consider additional imaging. Electronically Signed   By: Paulina Fusi M.D.   On: 06/06/2018 09:25   Dg Forearm Right  Result Date: 06/06/2018 CLINICAL DATA:  Unwitnessed fall. EXAM: RIGHT FOREARM - 2 VIEW COMPARISON:  None. FINDINGS: There is no evidence of fracture or other focal bone lesions. Soft tissues are unremarkable. IMPRESSION: No acute abnormality seen in the right forearm. Electronically Signed   By: Lupita Raider, M.D.   On: 06/06/2018 09:27   Dg Tibia/fibula Right  Result Date: 06/06/2018 CLINICAL DATA:  Unwitnessed fall. EXAM: RIGHT TIBIA AND FIBULA - 2 VIEW COMPARISON:  None. FINDINGS: There is no evidence of fracture or other focal bone lesions. Soft tissues are unremarkable. IMPRESSION: Negative. Electronically Signed   By: Lupita Raider, M.D.   On: 06/06/2018 09:31   Ct Head Wo Contrast  Result Date: 06/06/2018 CLINICAL DATA:  Found on the ground.  Dementia. EXAM: CT HEAD WITHOUT CONTRAST CT CERVICAL SPINE WITHOUT CONTRAST TECHNIQUE: Multidetector CT imaging of the head and cervical spine was performed following the standard protocol without intravenous contrast. Multiplanar CT image reconstructions  of the cervical spine were also generated. COMPARISON:  06/02/2018 FINDINGS: CT HEAD FINDINGS Brain: Generalized atrophy as seen previously. 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 Old right occipital infarction. Small-vessel changes of the hemispheric white matter. No sign of acute infarction, mass lesion, hemorrhage, hydrocephalus or extra-axial collection. Vascular: There is atherosclerotic calcification of the major vessels at the base of the brain. Skull: Negative Sinuses/Orbits: Clear/normal Other: None CT CERVICAL SPINE FINDINGS Alignment: No acute malalignment. Chronic anterolisthesis at C2-3 of 3 mm. Skull base and vertebrae: Old healed C2 fracture. C1-C2 union. Sufficient patency of the canal in that region. Chronic fusion at C3-4 and C5-6. Soft tissues and spinal canal: No acute soft tissue finding. Disc levels: Degenerative spondylosis and facet arthropathy throughout the cervical region. No evidence of critical central canal stenosis. Bilateral foraminal narrowing because of osteophytic encroachment. Upper chest: Negative Other: None IMPRESSION: Head CT: No acute or traumatic finding. Atrophy and old ischemic changes as above. Cervical spine CT: No acute or traumatic finding. Old C2 fracture with healing and C1-2 fusion. Other levels cervical spine fusion as noted above. Advanced chronic spondylosis and facet arthropathy. Electronically Signed   By: Paulina Fusi M.D.   On: 06/06/2018 09:29   Ct Cervical Spine Wo Contrast  Result Date: 06/06/2018 CLINICAL DATA:  Found on the ground.  Dementia. EXAM: CT HEAD WITHOUT CONTRAST CT CERVICAL SPINE WITHOUT CONTRAST TECHNIQUE: Multidetector CT imaging of the head and cervical spine was performed following the standard protocol without intravenous contrast. Multiplanar CT image reconstructions of the cervical spine were also generated. COMPARISON:  06/02/2018 FINDINGS: CT HEAD FINDINGS Brain: Generalized atrophy as seen  previously. 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 Old right occipital infarction. Small-vessel changes of the hemispheric white matter. No sign of acute infarction, mass lesion, hemorrhage, hydrocephalus or extra-axial collection. Vascular: There is atherosclerotic calcification of the major vessels  at the base of the brain. Skull: Negative Sinuses/Orbits: Clear/normal Other: None CT CERVICAL SPINE FINDINGS Alignment: No acute malalignment. Chronic anterolisthesis at C2-3 of 3 mm. Skull base and vertebrae: Old healed C2 fracture. C1-C2 union. Sufficient patency of the canal in that region. Chronic fusion at C3-4 and C5-6. Soft tissues and spinal canal: No acute soft tissue finding. Disc levels: Degenerative spondylosis and facet arthropathy throughout the cervical region. No evidence of critical central canal stenosis. Bilateral foraminal narrowing because of osteophytic encroachment. Upper chest: Negative Other: None IMPRESSION: Head CT: No acute or traumatic finding. Atrophy and old ischemic changes as above. Cervical spine CT: No acute or traumatic finding. Old C2 fracture with healing and C1-2 fusion. Other levels cervical spine fusion as noted above. Advanced chronic spondylosis and facet arthropathy. Electronically Signed   By: Paulina FusiMark  Shogry M.D.   On: 06/06/2018 09:29   Dg Shoulder Right Portable  Result Date: 06/06/2018 CLINICAL DATA:  Recent fall with pain.  Possible subluxation EXAM: PORTABLE RIGHT SHOULDER COMPARISON:  Right shoulder films from earlier today. FINDINGS: On the single view obtained the right humeral head does appear to be within the glenoid fossa. No dislocation is evident on this single view. There is significant degenerative change involving the right AC joint. IMPRESSION: 1. No dislocation is seen on the portable view obtained. 2. Degenerative change of the right AC joint. Electronically Signed   By: Dwyane DeePaul  Barry M.D.   On: 06/06/2018 12:18   Dg  Knee Complete 4 Views Right  Result Date: 06/06/2018 CLINICAL DATA:  Unwitnessed fall. EXAM: RIGHT KNEE - COMPLETE 4+ VIEW COMPARISON:  None. FINDINGS: No evidence of fracture, dislocation, or joint effusion. Severe narrowing of patellofemoral, medial and lateral joint spaces is noted. Soft tissues are unremarkable. IMPRESSION: Severe tricompartmental degenerative joint disease. No acute abnormality seen in the right knee. Electronically Signed   By: Lupita RaiderJames  Green Jr, M.D.   On: 06/06/2018 09:29   Dg Hip Unilat W Or Wo Pelvis 2-3 Views Right  Result Date: 06/06/2018 CLINICAL DATA:  Right hip pain due to a fall today. Initial encounter. EXAM: DG HIP (WITH OR WITHOUT PELVIS) 2-3V RIGHT COMPARISON:  None. FINDINGS: There is no acute bony or joint abnormality. The patient has advanced bilateral hip osteoarthritis. Chondrocalcinosis about the hips noted. IMPRESSION: No acute finding. Bilateral hip osteoarthritis. Electronically Signed   By: Drusilla Kannerhomas  Dalessio M.D.   On: 06/06/2018 09:27    Procedures Procedures (including critical care time)  Medications Ordered in ED Medications - No data to display   Initial Impression / Assessment and Plan / ED Course  I have reviewed the triage vital signs and the nursing notes.  Pertinent labs & imaging results that were available during my care of the patient were reviewed by me and considered in my medical decision making (see chart for details).  84 year old male with hx CHF and dementia who appears chronically ill who presents to the ED with fall.  Normal vital signs.  Patient did have an oxygen saturation 83%, however nursing states that patient did not have oxygen prob on correctly.  With replacement patient's oxygen saturations 98% on room air with good waveform.  AFebrile, nonseptic appearing.  Per EMS, facility states patient is at baseline mentality.  He is able to follow commands.  Patient moves all extremities without ataxia and without difficulty.  CT  head and neck negative for acute findings.  Labs at patient's baseline.  Original plain film right shoulder with possible  inferior dislocation.  Additional images indicate no acute fracture or dislocation.  No fracture or dislocations on other imaging.  Pelvis and hip imaging negative.  Chest x-ray with right-sided pleural effusion, patient has had this on his last 3 chest x-rays.  Discussed with Dr. Charm Barges, and my attending physician x-ray findings.  Feels patient is safe for discharge at this time.  Patient's contusions have been cleaned and dressed.  I have tried multiple times to reach patient's emergency contact to give update on status. I have not been able to get in contact with them.  Patient to be discharged back to facility.  Patient has been seen and evaluated by my attending, Dr. Charm Barges who agrees with above treatment, plan and disposition.   Clinical Course as of Jun 06 1314  Thu Jun 06, 2018  [id number] 84 year old male here after a fall at his facility.  Dementia level 5 caveat.  Patient has an abrasion on his forehead.  He is otherwise moving all his extremities.  Of note they read his right shoulder as possible inferior dislocation.  Clinically he does not seem to have any limitations to range of motion although it is difficult with his baseline rigidity   [MB]  1238 No dislocation or fracture  DG Shoulder Right Portable [BH]  1239 No dislocation or fracture  DG Tibia/Fibula Right [BH]  1239 Degenerative changes   [BH]  1239 No acute or traumatic findings.   [BH]  1240 No acute traumatic findings.  Old C2 fracture, well healing.   [BH]  1240 No fracture or dislocation  DG Forearm Right [BH]  1240 DG Hip Unilat W or Wo Pelvis 2-3 Views Right [BH]  1241 No acute findings   [BH]    Clinical Course User Index [BH] Alp Goldwater A, PA-C [MB] Terrilee Files, MD    Final Clinical Impressions(s) / ED Diagnoses   Final diagnoses:  Fall, initial encounter  Contusion of scalp,  initial encounter  Contusion of face, initial encounter  Contusion of right knee, initial encounter    ED Discharge Orders    None       Noami Bove A, PA-C 06/06/18 1316    Terrilee Files, MD 06/06/18 1744

## 2018-06-07 ENCOUNTER — Emergency Department (HOSPITAL_COMMUNITY)
Admission: EM | Admit: 2018-06-07 | Discharge: 2018-06-07 | Disposition: A | Discharge status: Home / Self Care | Payer: Medicare HMO | Attending: Emergency Medicine | Admitting: Emergency Medicine

## 2018-06-07 ENCOUNTER — Emergency Department (HOSPITAL_COMMUNITY): Payer: Medicare HMO

## 2018-06-07 ENCOUNTER — Other Ambulatory Visit: Payer: Self-pay

## 2018-06-07 DIAGNOSIS — I509 Heart failure, unspecified: Secondary | ICD-10-CM | POA: Diagnosis not present

## 2018-06-07 DIAGNOSIS — E039 Hypothyroidism, unspecified: Secondary | ICD-10-CM | POA: Insufficient documentation

## 2018-06-07 DIAGNOSIS — R600 Localized edema: Secondary | ICD-10-CM | POA: Diagnosis not present

## 2018-06-07 DIAGNOSIS — F0390 Unspecified dementia without behavioral disturbance: Secondary | ICD-10-CM | POA: Diagnosis not present

## 2018-06-07 DIAGNOSIS — I959 Hypotension, unspecified: Secondary | ICD-10-CM | POA: Diagnosis not present

## 2018-06-07 DIAGNOSIS — M2550 Pain in unspecified joint: Secondary | ICD-10-CM | POA: Diagnosis not present

## 2018-06-07 DIAGNOSIS — N390 Urinary tract infection, site not specified: Secondary | ICD-10-CM

## 2018-06-07 DIAGNOSIS — Z79899 Other long term (current) drug therapy: Secondary | ICD-10-CM | POA: Diagnosis not present

## 2018-06-07 DIAGNOSIS — S0081XA Abrasion of other part of head, initial encounter: Secondary | ICD-10-CM | POA: Diagnosis not present

## 2018-06-07 DIAGNOSIS — R0902 Hypoxemia: Secondary | ICD-10-CM | POA: Diagnosis not present

## 2018-06-07 DIAGNOSIS — W19XXXA Unspecified fall, initial encounter: Secondary | ICD-10-CM

## 2018-06-07 DIAGNOSIS — Z043 Encounter for examination and observation following other accident: Secondary | ICD-10-CM | POA: Diagnosis present

## 2018-06-07 DIAGNOSIS — R55 Syncope and collapse: Secondary | ICD-10-CM | POA: Diagnosis not present

## 2018-06-07 DIAGNOSIS — Z7401 Bed confinement status: Secondary | ICD-10-CM | POA: Diagnosis not present

## 2018-06-07 LAB — URINALYSIS, ROUTINE W REFLEX MICROSCOPIC
Bilirubin Urine: NEGATIVE
GLUCOSE, UA: NEGATIVE mg/dL
Ketones, ur: NEGATIVE mg/dL
Nitrite: NEGATIVE
Protein, ur: 30 mg/dL — AB
RBC / HPF: >50 RBC/hpf — ABNORMAL HIGH (ref 0–5)
Specific Gravity, Urine: 1.012 (ref 1.005–1.030)
pH: 5 (ref 5.0–8.0)

## 2018-06-07 LAB — COMPREHENSIVE METABOLIC PANEL
ALT: 15 U/L (ref 0–44)
AST: 31 U/L (ref 15–41)
Albumin: 3.2 g/dL — ABNORMAL LOW (ref 3.5–5.0)
Alkaline Phosphatase: 49 U/L (ref 38–126)
Anion gap: 13 (ref 5–15)
BUN: 22 mg/dL (ref 8–23)
CHLORIDE: 107 mmol/L (ref 98–111)
CO2: 22 mmol/L (ref 22–32)
Calcium: 8.5 mg/dL — ABNORMAL LOW (ref 8.9–10.3)
Creatinine, Ser: 0.95 mg/dL (ref 0.61–1.24)
GFR calc Af Amer: >60 mL/min (ref >60)
GFR calc non Af Amer: >60 mL/min (ref >60)
Glucose, Bld: 144 mg/dL — ABNORMAL HIGH (ref 70–99)
POTASSIUM: 3.5 mmol/L (ref 3.5–5.1)
Sodium: 142 mmol/L (ref 135–145)
Total Bilirubin: 1 mg/dL (ref 0.3–1.2)
Total Protein: 6.5 g/dL (ref 6.5–8.1)

## 2018-06-07 LAB — CBC WITH DIFFERENTIAL/PLATELET
Abs Immature Granulocytes: 0.07 10*3/uL (ref 0.00–0.07)
Basophils Absolute: 0 10*3/uL (ref 0.0–0.1)
Basophils Relative: 0 %
Eosinophils Absolute: 0.1 10*3/uL (ref 0.0–0.5)
Eosinophils Relative: 1 %
HCT: 37.2 % — ABNORMAL LOW (ref 39.0–52.0)
Hemoglobin: 11.5 g/dL — ABNORMAL LOW (ref 13.0–17.0)
IMMATURE GRANULOCYTES: 1 %
Lymphocytes Relative: 13 %
Lymphs Abs: 1 10*3/uL (ref 0.7–4.0)
MCH: 28.7 pg (ref 26.0–34.0)
MCHC: 30.9 g/dL (ref 30.0–36.0)
MCV: 92.8 fL (ref 80.0–100.0)
MONOS PCT: 14 %
Monocytes Absolute: 1.1 10*3/uL — ABNORMAL HIGH (ref 0.1–1.0)
Neutro Abs: 5.6 10*3/uL (ref 1.7–7.7)
Neutrophils Relative %: 71 %
Platelets: 193 10*3/uL (ref 150–400)
RBC: 4.01 MIL/uL — ABNORMAL LOW (ref 4.22–5.81)
RDW: 14.3 % (ref 11.5–15.5)
WBC: 7.8 10*3/uL (ref 4.0–10.5)
nRBC: 0 % (ref 0.0–0.2)

## 2018-06-07 MED ORDER — SODIUM CHLORIDE 0.9 % IV SOLN
1.0000 g | Freq: Once | INTRAVENOUS | Status: AC
  Administered 2018-06-07: 1 g via INTRAVENOUS
  Filled 2018-06-07: qty 10

## 2018-06-07 MED ORDER — CEPHALEXIN 500 MG PO CAPS: 500.0000 mg | ORAL_CAPSULE | Freq: Four times a day (QID) | ORAL | 0 refills | Status: AC

## 2018-06-07 NOTE — ED Notes (Signed)
[facility] at bedside [facility]. Given paperwork and discharge paperwork

## 2018-06-07 NOTE — ED Notes (Signed)
Bed: WA25 Expected date:  Expected time:  Means of arrival:  Comments: EMS fall 

## 2018-06-07 NOTE — ED Notes (Signed)
Urine leg bag changed to new bag in order to collect clean urine sample.

## 2018-06-07 NOTE — ED Provider Notes (Signed)
[facility]-EMERGENCY DEPT Provider Note   CSN: [id number] Arrival date & time: 06/07/18  1622     History   Chief Complaint Chief Complaint  Patient presents with  . Fall    HPI Brandon Franklin is a 84 y.o. male.  HPI Patient found on floor by nursing home staff.  Unwitnessed ground-level fall.  This is the third fall in the past week.  Has a history of dementia and therefore history is limited.  Level 5 caveat applies.  Patient has been on Keflex for urinary tract infection.  Past Medical History:  Diagnosis Date  . 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 Allergy    seasonal  . Arthritis   . Cellulitis 09/20/2015  . CHF (congestive heart failure) (HCC)   . Complication of indwelling urinary catheter (HCC)    Suprapubic catheter in place s/p urologic surgery   . Depression   . Hyperglycemia 05/09/2015  . Low back pain 04/04/2015  . Neck fracture (HCC)   . Neuromuscular disorder (HCC)   . Obesity 04/04/2015  . Preventative health care 05/30/2017  . Urinary anomaly   . Urinary retention 09/21/2016  . Vitamin B12 deficiency 04/04/2015    Patient Active Problem List   Diagnosis Date Noted  . Constipation 03/11/2018  . Anemia 11/05/2017  . Pneumonia 08/13/2017  . Preventative health care 05/30/2017  . Hypothyroid 05/28/2017  . Urinary retention 09/21/2016  . Anxiety state 08/21/2016  . 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 Hyperglycemia 05/09/2015  . Obesity 04/04/2015  . Low back pain 04/04/2015  . Vitamin B 12 deficiency 04/04/2015  . Allergy   . Arthritis   . CHF (congestive heart failure) (HCC)   . Complication of indwelling urinary catheter (HCC)   . Neck fracture [facility])     Past Surgical History:  Procedure Laterality Date  . INSERTION OF SUPRAPUBIC CATHETER    . SURGERY SCROTAL / TESTICULAR Right         Home Medications    Prior to Admission medications   Medication Sig Start Date End Date Taking? Authorizing Provider  cholecalciferol  (VITAMIN D) 1000 UNITS tablet Take 1,000 Units by mouth daily.   Yes [provider]  furosemide (LASIX) 40 MG tablet Take 1 tablet (40 mg total) by mouth daily. 03/27/18  Yes Bradd Canary, MD  gabapentin (NEURONTIN) 300 MG capsule TAKE 1 CAPSULE BY MOUTH THREE TIMES DAILY Patient taking differently: Take 300 mg by mouth 3 (three) times daily.  05/08/18  Yes Bradd Canary, MD  levothyroxine (SYNTHROID, LEVOTHROID) 25 MCG tablet TAKE 1 TABLET BY MOUTH ONCE DAILY BEFORE BREAKFAST Patient taking differently: Take 25 mcg by mouth daily before breakfast.  04/15/18  Yes Bradd Canary, MD  memantine (NAMENDA) 5 MG tablet TAKE 1 TABLET BY MOUTH ONCE DAILY Patient taking differently: Take 5 mg by mouth daily.  01/15/18  Yes Bradd Canary, MD  Menthol-Zinc Oxide 0.44-20.6 % OINT Apply 1 application topically 3 (three) times daily.   Yes [provider]  oxybutynin (DITROPAN) 5 MG tablet Take 10 mg by mouth daily.    Yes [provider]  bisacodyl (DULCOLAX) 5 MG EC tablet Take 5 mg by mouth daily as needed for mild constipation.     [provider]  cefdinir (OMNICEF) 300 MG capsule Take 1 capsule (300 mg total) by mouth 2 (two) times daily. Patient not taking: Reported on 06/02/2018 01/21/18   Bradd Canary, MD  cephALEXin (KEFLEX) 500 MG capsule Take  1 capsule (500 mg total) by mouth 4 (four) times daily. 06/07/18   Loren Racer, MD  cyanocobalamin (,VITAMIN B-12,) 1000 MCG/ML injection Inject 1 mL (1,000 mcg total) into the muscle every 30 (thirty) days. Patient not taking: Reported on 06/02/2018 08/29/17   Bradd Canary, MD  methadone (DOLOPHINE) 5 MG tablet Take 0.5 tablets (2.5 mg total) by mouth 3 (three) times daily as needed. Patient taking differently: Take 2.5 mg by mouth 3 (three) times daily as needed for moderate pain.  05/15/18   Bradd Canary, MD  Methylnaltrexone Bromide (RELISTOR) 150 MG TABS Take 1 tablet by mouth daily as needed. Patient  taking differently: Take 150 mg by mouth daily as needed (constipation).  03/11/18   Bradd Canary, MD  nystatin cream (MYCOSTATIN) Apply 1 application topically 2 (two) times daily. Patient taking differently: Apply 1 application topically 2 (two) times daily as needed for dry skin.  08/13/17   Bradd Canary, MD  senna-docusate (SENOKOT-S) 8.6-50 MG per tablet Take 4 tablets by mouth 2 (two) times daily as needed for mild constipation.     [provider]  Syringe/Needle, Disp, (SYRINGE 3CC/25GX1") 25G X 1" 3 ML MISC To use with vitamin B12 07/13/15   Bradd Canary, MD    Family History Family History  Problem Relation Age of Onset  . Cancer Sister     Social History Social History   Tobacco Use  . Smoking status: Never Smoker  . Smokeless tobacco: Never Used  Substance Use Topics  . Alcohol use: No    Alcohol/week: 0.0 standard drinks  . Drug use: No     Allergies   Ciprofloxacin; Levaquin [levofloxacin in d5w]; Sulfamethoxazole-trimethoprim; and Doxycycline hyclate   Review of Systems Review of Systems  Unable to perform ROS: Dementia     Physical Exam Updated Vital Signs BP 102/77   Pulse 74   Temp (!) 97.5 F (36.4 C) (Oral)   Resp (!) 22   Wt 111.1 kg   SpO2 100%   BMI 34.29 kg/m   Physical Exam  Constitutional: He appears well-developed and well-nourished.  HENT:  Head: Normocephalic.  Mouth/Throat: Oropharynx is clear and moist.  Subacute appearing abrasion to the right forehead.  Midface is stable.  Eyes: Pupils are equal, round, and reactive to light. EOM are normal.  Neck: Normal range of motion. Neck supple.  No definite posterior midline cervical tenderness to palpation.  Cardiovascular: Normal rate and regular rhythm.  Pulmonary/Chest: Effort normal and breath sounds normal.  Abdominal: Soft. Bowel sounds are normal. There is no tenderness. There is no rebound and no guarding.  Musculoskeletal: Normal range of motion. He exhibits  edema. He exhibits no tenderness.  Bilateral lower extremity edema.  Neurological:  Patient is confused and mildly agitated.  Not following commands.  Does appear to move all extremities.  Skin: Skin is warm and dry. Capillary refill takes less than 2 seconds. No rash noted. No erythema.  Psychiatric: His behavior is normal.  Nursing note and vitals reviewed.    ED Treatments / Results  Labs (all labs ordered are listed, but only abnormal results are displayed) Labs Reviewed  CBC WITH DIFFERENTIAL/PLATELET - Abnormal; Notable for the following components:      Result Value   RBC 4.01 (*)    Hemoglobin 11.5 (*)    HCT 37.2 (*)    Monocytes Absolute 1.1 (*)    All other components within normal limits  COMPREHENSIVE METABOLIC PANEL - Abnormal;  Notable for the following components:   Glucose, Bld 144 (*)    Calcium 8.5 (*)    Albumin 3.2 (*)    All other components within normal limits  URINALYSIS, ROUTINE W REFLEX MICROSCOPIC - Abnormal; Notable for the following components:   APPearance CLOUDY (*)    Hgb urine dipstick MODERATE (*)    Protein, ur 30 (*)    Leukocytes, UA LARGE (*)    RBC / HPF >50 (*)    WBC, UA >50 (*)    Bacteria, UA RARE (*)    All other components within normal limits  URINE CULTURE    EKG None  Radiology Dg Chest 1 View  Result Date: 06/06/2018 CLINICAL DATA:  Patient found down today. EXAM: CHEST  1 VIEW COMPARISON:  Single-view of the chest 06/02/2018 and 05/30/2018. FINDINGS: There is cardiomegaly. New small right pleural effusion and basilar airspace disease are seen. Hiatal hernia is noted. No pneumothorax. IMPRESSION: Small right pleural effusion and basilar airspace disease which could be atelectasis or pneumonia. Negative for pneumothorax or acute bony abnormality. Cardiomegaly. Hiatal hernia. Electronically Signed   By: Drusilla Kanner M.D.   On: 06/06/2018 09:29   Dg Shoulder Right  Result Date: 06/06/2018 CLINICAL DATA:  Followup right  shoulder reduction. EXAM: RIGHT SHOULDER - 2+ VIEW COMPARISON:  Earlier same day. FINDINGS: Again, positioning is suboptimal, essentially with one-view, best characterized as a transscapular Y-view. The humeral head appears to relate fairly normally to the glenoid, possibly with mild inferior subluxation. No evidence of regional fracture. IMPRESSION: Again there is a limited and suboptimal number of images and positioning. The humeral head does appear to be in normal relation to the glenoid, except for possible mild inferior subluxation. Electronically Signed   By: Paulina Fusi M.D.   On: 06/06/2018 10:51   Dg Shoulder Right  Result Date: 06/06/2018 CLINICAL DATA:  Unwitnessed fall.  Found on the floor.  Dementia. EXAM: RIGHT SHOULDER - 2+ VIEW COMPARISON:  None. FINDINGS: Humeral head shows some inferior subluxation relative to the glenoid. Limited views do not allow me to completely exclude inferior subluxation/dislocation. No evidence of regional fracture. Consider additional imaging when able. IMPRESSION: Limited evaluation. Essentially one projection obtained. Humeral head appears subluxed or possibly dislocated inferiorly. Consider additional imaging. Electronically Signed   By: Paulina Fusi M.D.   On: 06/06/2018 09:25   Dg Forearm Right  Result Date: 06/06/2018 CLINICAL DATA:  Unwitnessed fall. EXAM: RIGHT FOREARM - 2 VIEW COMPARISON:  None. FINDINGS: There is no evidence of fracture or other focal bone lesions. Soft tissues are unremarkable. IMPRESSION: No acute abnormality seen in the right forearm. Electronically Signed   By: Lupita Raider, M.D.   On: 06/06/2018 09:27   Dg Tibia/fibula Right  Result Date: 06/06/2018 CLINICAL DATA:  Unwitnessed fall. EXAM: RIGHT TIBIA AND FIBULA - 2 VIEW COMPARISON:  None. FINDINGS: There is no evidence of fracture or other focal bone lesions. Soft tissues are unremarkable. IMPRESSION: Negative. Electronically Signed   By: Lupita Raider, M.D.   On:  06/06/2018 09:31   Ct Head Wo Contrast  Result Date: 06/07/2018 CLINICAL DATA:  Found down.  Frequent falls.  History of dementia. EXAM: CT HEAD WITHOUT CONTRAST CT CERVICAL SPINE WITHOUT CONTRAST TECHNIQUE: Multidetector CT imaging of the head and cervical spine was performed following the standard protocol without intravenous contrast. Multiplanar CT image reconstructions of the cervical spine were also generated. COMPARISON:  CT HEAD and cervical spine June 06, 2018 FINDINGS:  CT HEAD FINDINGS BRAIN: No intraparenchymal hemorrhage, mass effect nor midline shift. LEFT greater than RIGHT cerebellar and RIGHT occipital lobe encephalomalacia. Old LEFT basal ganglia infarct. Moderate to severe parenchymal brain volume loss. No hydrocephalus. Patchy supratentorial white matter hypodensities within normal range for patient's age, though non-specific are most compatible with chronic small vessel ischemic disease. No acute large vascular territory infarcts. No abnormal extra-axial fluid collections. Basal cisterns are patent. VASCULAR: Moderate calcific atherosclerosis of the carotid siphons. SKULL: No skull fracture. No significant scalp soft tissue swelling. Asymmetric RIGHT paraspinal muscle atrophy. SINUSES/ORBITS: Trace paranasal sinus mucosal thickening. Mastoid air cells are well aerated.Similar dense 11 mm LEFT medial orbital extraconal apex mass though, new from 2018. OTHER: None. CT CERVICAL SPINE FINDINGS ALIGNMENT: Straighten lordosis. Grade 1 C7-T1 anterolisthesis. Cervicothoracic levoscoliosis. C1-2 arthrodesis. SKULL BASE AND VERTEBRAE: Old healed base of dens fracture. Remaining vertebral bodies intact. Severe cervical spondylosis and severe facet arthropathy stable from prior examination. No destructive bony lesions. SOFT TISSUES AND SPINAL CANAL: Nonacute. RIGHT semispinalis muscle atrophy. DISC LEVELS: Similar multilevel canal stenosis and advanced neural foraminal narrowing. UPPER CHEST: Lung  apices are clear. OTHER: None. IMPRESSION: CT HEAD: 1. No acute intracranial process. 2. Stable examination including old RIGHT occipital/PCA and bilateral cerebellar infarcts. Old LEFT basal ganglia infarct. 3. Dense 11 mm LEFT orbital apex mass, differential diagnosis includes lymphoma or metastasis. Findings would be better demonstrated on MRI of the orbits with and without contrast as clinically indicated. CT CERVICAL SPINE: 1. No acute fracture or malalignment.  Old healed C2 fracture. Electronically Signed   By: Awilda Metro M.D.   On: 06/07/2018 18:11   Ct Head Wo Contrast  Result Date: 06/06/2018 CLINICAL DATA:  Found on the ground.  Dementia. EXAM: CT HEAD WITHOUT CONTRAST CT CERVICAL SPINE WITHOUT CONTRAST TECHNIQUE: Multidetector CT imaging of the head and cervical spine was performed following the standard protocol without intravenous contrast. Multiplanar CT image reconstructions of the cervical spine were also generated. COMPARISON:  06/02/2018 FINDINGS: CT HEAD FINDINGS Brain: Generalized atrophy as seen previously. 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 Old right occipital infarction. Small-vessel changes of the hemispheric white matter. No sign of acute infarction, mass lesion, hemorrhage, hydrocephalus or extra-axial collection. Vascular: There is atherosclerotic calcification of the major vessels at the base of the brain. Skull: Negative Sinuses/Orbits: Clear/normal Other: None CT CERVICAL SPINE FINDINGS Alignment: No acute malalignment. Chronic anterolisthesis at C2-3 of 3 mm. Skull base and vertebrae: Old healed C2 fracture. C1-C2 union. Sufficient patency of the canal in that region. Chronic fusion at C3-4 and C5-6. Soft tissues and spinal canal: No acute soft tissue finding. Disc levels: Degenerative spondylosis and facet arthropathy throughout the cervical region. No evidence of critical central canal stenosis. Bilateral foraminal narrowing  because of osteophytic encroachment. Upper chest: Negative Other: None IMPRESSION: Head CT: No acute or traumatic finding. Atrophy and old ischemic changes as above. Cervical spine CT: No acute or traumatic finding. Old C2 fracture with healing and C1-2 fusion. Other levels cervical spine fusion as noted above. Advanced chronic spondylosis and facet arthropathy. Electronically Signed   By: Paulina Fusi M.D.   On: 06/06/2018 09:29   Ct Cervical Spine Wo Contrast  Result Date: 06/07/2018 CLINICAL DATA:  Found down.  Frequent falls.  History of dementia. EXAM: CT HEAD WITHOUT CONTRAST CT CERVICAL SPINE WITHOUT CONTRAST TECHNIQUE: Multidetector CT imaging of the head and cervical spine was performed following the standard protocol without intravenous contrast.  Multiplanar CT image reconstructions of the cervical spine were also generated. COMPARISON:  CT HEAD and cervical spine June 06, 2018 FINDINGS: CT HEAD FINDINGS BRAIN: No intraparenchymal hemorrhage, mass effect nor midline shift. LEFT greater than RIGHT cerebellar and RIGHT occipital lobe encephalomalacia. Old LEFT basal ganglia infarct. Moderate to severe parenchymal brain volume loss. No hydrocephalus. Patchy supratentorial white matter hypodensities within normal range for patient's age, though non-specific are most compatible with chronic small vessel ischemic disease. No acute large vascular territory infarcts. No abnormal extra-axial fluid collections. Basal cisterns are patent. VASCULAR: Moderate calcific atherosclerosis of the carotid siphons. SKULL: No skull fracture. No significant scalp soft tissue swelling. Asymmetric RIGHT paraspinal muscle atrophy. SINUSES/ORBITS: Trace paranasal sinus mucosal thickening. Mastoid air cells are well aerated.Similar dense 11 mm LEFT medial orbital extraconal apex mass though, new from 2018. OTHER: None. CT CERVICAL SPINE FINDINGS ALIGNMENT: Straighten lordosis. Grade 1 C7-T1 anterolisthesis. Cervicothoracic  levoscoliosis. C1-2 arthrodesis. SKULL BASE AND VERTEBRAE: Old healed base of dens fracture. Remaining vertebral bodies intact. Severe cervical spondylosis and severe facet arthropathy stable from prior examination. No destructive bony lesions. SOFT TISSUES AND SPINAL CANAL: Nonacute. RIGHT semispinalis muscle atrophy. DISC LEVELS: Similar multilevel canal stenosis and advanced neural foraminal narrowing. UPPER CHEST: Lung apices are clear. OTHER: None. IMPRESSION: CT HEAD: 1. No acute intracranial process. 2. Stable examination including old RIGHT occipital/PCA and bilateral cerebellar infarcts. Old LEFT basal ganglia infarct. 3. Dense 11 mm LEFT orbital apex mass, differential diagnosis includes lymphoma or metastasis. Findings would be better demonstrated on MRI of the orbits with and without contrast as clinically indicated. CT CERVICAL SPINE: 1. No acute fracture or malalignment.  Old healed C2 fracture. Electronically Signed   By: Awilda Metroourtnay  Bloomer M.D.   On: 06/07/2018 18:11   Ct Cervical Spine Wo Contrast  Result Date: 06/06/2018 CLINICAL DATA:  Found on the ground.  Dementia. EXAM: CT HEAD WITHOUT CONTRAST CT CERVICAL SPINE WITHOUT CONTRAST TECHNIQUE: Multidetector CT imaging of the head and cervical spine was performed following the standard protocol without intravenous contrast. Multiplanar CT image reconstructions of the cervical spine were also generated. COMPARISON:  06/02/2018 FINDINGS: CT HEAD FINDINGS Brain: Generalized atrophy as seen previously. 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 Old right occipital infarction. Small-vessel changes of the hemispheric white matter. No sign of acute infarction, mass lesion, hemorrhage, hydrocephalus or extra-axial collection. Vascular: There is atherosclerotic calcification of the major vessels at the base of the brain. Skull: Negative Sinuses/Orbits: Clear/normal Other: None CT CERVICAL SPINE FINDINGS Alignment: No  acute malalignment. Chronic anterolisthesis at C2-3 of 3 mm. Skull base and vertebrae: Old healed C2 fracture. C1-C2 union. Sufficient patency of the canal in that region. Chronic fusion at C3-4 and C5-6. Soft tissues and spinal canal: No acute soft tissue finding. Disc levels: Degenerative spondylosis and facet arthropathy throughout the cervical region. No evidence of critical central canal stenosis. Bilateral foraminal narrowing because of osteophytic encroachment. Upper chest: Negative Other: None IMPRESSION: Head CT: No acute or traumatic finding. Atrophy and old ischemic changes as above. Cervical spine CT: No acute or traumatic finding. Old C2 fracture with healing and C1-2 fusion. Other levels cervical spine fusion as noted above. Advanced chronic spondylosis and facet arthropathy. Electronically Signed   By: Paulina FusiMark  Shogry M.D.   On: 06/06/2018 09:29   Dg Shoulder Right Portable  Result Date: 06/06/2018 CLINICAL DATA:  Recent fall with pain.  Possible subluxation EXAM: PORTABLE RIGHT SHOULDER COMPARISON:  Right shoulder films  from earlier today. FINDINGS: On the single view obtained the right humeral head does appear to be within the glenoid fossa. No dislocation is evident on this single view. There is significant degenerative change involving the right AC joint. IMPRESSION: 1. No dislocation is seen on the portable view obtained. 2. Degenerative change of the right AC joint. Electronically Signed   By: Dwyane Dee M.D.   On: 06/06/2018 12:18   Dg Knee Complete 4 Views Right  Result Date: 06/06/2018 CLINICAL DATA:  Unwitnessed fall. EXAM: RIGHT KNEE - COMPLETE 4+ VIEW COMPARISON:  None. FINDINGS: No evidence of fracture, dislocation, or joint effusion. Severe narrowing of patellofemoral, medial and lateral joint spaces is noted. Soft tissues are unremarkable. IMPRESSION: Severe tricompartmental degenerative joint disease. No acute abnormality seen in the right knee. Electronically Signed   By: Lupita Raider, M.D.   On: 06/06/2018 09:29   Dg Hip Unilat W Or Wo Pelvis 2-3 Views Right  Result Date: 06/06/2018 CLINICAL DATA:  Right hip pain due to a fall today. Initial encounter. EXAM: DG HIP (WITH OR WITHOUT PELVIS) 2-3V RIGHT COMPARISON:  None. FINDINGS: There is no acute bony or joint abnormality. The patient has advanced bilateral hip osteoarthritis. Chondrocalcinosis about the hips noted. IMPRESSION: No acute finding. Bilateral hip osteoarthritis. Electronically Signed   By: Drusilla Kanner M.D.   On: 06/06/2018 09:27    Procedures Procedures (including critical care time)  Medications Ordered in ED Medications  cefTRIAXone (ROCEPHIN) 1 g in sodium chloride 0.9 % 100 mL IVPB (0 g Intravenous Stopped 06/07/18 2112)     Initial Impression / Assessment and Plan / ED Course  I have reviewed the triage vital signs and the nursing notes.  Pertinent labs & imaging results that were available during my care of the patient were reviewed by me and considered in my medical decision making (see chart for details).     She with persistent UTI.  Treated with IV Rocephin.  Repeat CT head and cervical spine without acute findings.  Vital signs remained stable.  Will continue Keflex and send urine culture.  Final Clinical Impressions(s) / ED Diagnoses   Final diagnoses:  Fall, initial encounter  Acute lower UTI    ED Discharge Orders         Ordered    cephALEXin (KEFLEX) 500 MG capsule  4 times daily     06/07/18 2208           Loren Racer, MD 06/07/18 2326

## 2018-06-07 NOTE — ED Triage Notes (Signed)
Pt BIB EMS from [facility] for unwitnessed fall. Staff found pt on the ground in his room.  Hx of frequent falls, was seen here yesterday and dx with a UTI.   Complaints of right shoulder pain.  Hx of dementia.

## 2018-06-09 LAB — URINE CULTURE: Culture: >=100000 — AB

## 2018-06-10 ENCOUNTER — Encounter: Payer: Self-pay | Admitting: Family Medicine

## 2018-06-10 ENCOUNTER — Telehealth: Payer: Self-pay | Admitting: *Deleted

## 2018-06-10 NOTE — Telephone Encounter (Signed)
Post ED Visit - Positive Culture Follow-up: Successful Patient Follow-Up  Culture assessed and recommendations reviewed by:  []  Enzo BiNathan Batchelder, Pharm.D. []  Celedonio MiyamotoJeremy Frens, [address] BoulevardPharm.D., BCPS AQ-ID []  Garvin FilaMike Maccia, Pharm.D., BCPS []  Georgina PillionElizabeth Martin, Pharm.D., BCPS []  Las OchentaMinh Pham, [place]arm[place]D., BCPS, AAHIVP []  Estella HuskMichelle Turner, Pharm.D., BCPS, AAHIVP [x]  Brandon Pearlachel Rumbarger, PharmD, BCPS []  Phillips Climeshuy Dang, PharmD, BCPS []  Agapito GamesAlison Masters, PharmD, BCPS []  Verlan FriendsErin Deja, PharmD  Positive urine culture  []  Patient discharged without antimicrobial prescription and treatment is now indicated [x]  Organism is resistant to prescribed ED discharge antimicrobial []  Patient with positive blood cultures  Changes discussed with ED provider [name], PA-C New antibiotic prescription Fluconazole 200mg  po qd x 5 days [name], [name]: Phone [phone]/Fax [phone]  Date 06/10/2018, time 1125   Brandon PearlRobertson, Brandon Franklin 06/10/2018, 11:24 AM

## 2018-06-10 NOTE — Progress Notes (Signed)
ED Antimicrobial Stewardship Positive Culture Follow Up   Brandon RancherVernon Franklin is an 84 y.o. male who [facility] with a chief complaint of  Chief Complaint  Patient presents with  . Fall    Recent Results (from the past 720 hour(s))  Urine culture     Status: Abnormal   Collection Time: 06/07/18  7:01 PM  Result Value Ref Range Status   Specimen Description   Final    URINE, CLEAN CATCH Performed at [facility], [address] [place], [place] [address]    Special Requests   Final    NONE Performed at [facility], [address] [place], [place] [address]    Culture >=100,000 COLONIES/mL YEAST (A)  Final   Report Status 06/09/2018 FINAL  Final    [x]  Treated with cephalexin, organism resistant to prescribed antimicrobial []  Patient discharged originally without antimicrobial agent and treatment is now indicated  New antibiotic prescription: Unclear if falls due to yeast UTI, will start fluconazole 200mg  PO daily x 5 days  ED Provider: [name], PA   Brandon Franklin, Drake Leachachel Lynn 06/10/2018, 8:46 AM Clinical Pharmacist Monday - Friday phone -  [phone] Saturday - Sunday phone - [phone]

## 2018-06-11 MED ORDER — TRAZODONE HCL 50 MG PO TABS: 25.0000 mg | ORAL_TABLET | Freq: Every evening | ORAL | 1 refills | Status: DC | PRN

## 2018-06-12 ENCOUNTER — Telehealth: Payer: Self-pay | Admitting: Family Medicine

## 2018-06-12 ENCOUNTER — Encounter: Payer: Self-pay | Admitting: Family Medicine

## 2018-06-12 DIAGNOSIS — N390 Urinary tract infection, site not specified: Secondary | ICD-10-CM | POA: Diagnosis not present

## 2018-06-12 DIAGNOSIS — N319 Neuromuscular dysfunction of bladder, unspecified: Secondary | ICD-10-CM | POA: Diagnosis not present

## 2018-06-12 DIAGNOSIS — G709 Myoneural disorder, unspecified: Secondary | ICD-10-CM | POA: Diagnosis not present

## 2018-06-12 DIAGNOSIS — M47819 Spondylosis without myelopathy or radiculopathy, site unspecified: Secondary | ICD-10-CM | POA: Diagnosis not present

## 2018-06-12 DIAGNOSIS — M5031 Other cervical disc degeneration,  high cervical region: Secondary | ICD-10-CM | POA: Diagnosis not present

## 2018-06-12 DIAGNOSIS — I509 Heart failure, unspecified: Secondary | ICD-10-CM | POA: Diagnosis not present

## 2018-06-12 DIAGNOSIS — E039 Hypothyroidism, unspecified: Secondary | ICD-10-CM | POA: Diagnosis not present

## 2018-06-12 DIAGNOSIS — F0280 Dementia in other diseases classified elsewhere without behavioral disturbance: Secondary | ICD-10-CM | POA: Diagnosis not present

## 2018-06-12 DIAGNOSIS — Z435 Encounter for attention to cystostomy: Secondary | ICD-10-CM | POA: Diagnosis not present

## 2018-06-12 MED ORDER — TRAZODONE HCL 50 MG PO TABS: 25.0000 mg | ORAL_TABLET | Freq: Every evening | ORAL | 1 refills | Status: AC | PRN

## 2018-06-12 NOTE — Telephone Encounter (Signed)
Called HHRN left a detailed message of PCP ok per request. 

## 2018-06-12 NOTE — Telephone Encounter (Signed)
Copied from CRM [phone]. Topic: Quick Communication - See Telephone Encounter >> Jun 12, 2018  4:00 PM Terisa Starraylor, Brittany L wrote: CRM for notification. See Telephone encounter for: 06/12/18.  April, RN with [facility] called and would like to get orders to change his catheter weekly.  2 week 1 1 month 3 2 prn visits Call back @ [phone]

## 2018-06-13 ENCOUNTER — Telehealth: Payer: Self-pay

## 2018-06-13 ENCOUNTER — Encounter: Payer: Self-pay | Admitting: Family Medicine

## 2018-06-14 NOTE — Telephone Encounter (Signed)
Done

## 2018-06-21 ENCOUNTER — Ambulatory Visit: Payer: Self-pay

## 2018-06-21 NOTE — Telephone Encounter (Signed)
[place] [facility]

## 2018-06-23 NOTE — Telephone Encounter (Signed)
Do we need to do anything

## 2018-06-24 ENCOUNTER — Telehealth: Payer: Self-pay | Admitting: *Deleted

## 2018-06-24 NOTE — Telephone Encounter (Signed)
Received Discharge-Transfer Summary Report from [facility]; forwarded to provider/SLS 12/23

## 2018-06-24 NOTE — Telephone Encounter (Signed)
Just waiting on them to determine cause of death. They stated that they did not have records that patient had a spine injury, which was listed as cause of death.  Notes where given to them for their verification

## 2018-06-24 NOTE — Telephone Encounter (Signed)
So now they have the imaging and the documentation? Nothing for us to do now? Thanks

## 2018-06-28 ENCOUNTER — Telehealth: Payer: Self-pay | Admitting: *Deleted

## 2018-06-28 NOTE — Telephone Encounter (Signed)
Received Home Health Certification and Plan of Care; forwarded to provider/SLS 12/27  

## 2018-07-03 NOTE — Telephone Encounter (Signed)
Please send family a condolence card

## 2018-07-03 NOTE — Telephone Encounter (Signed)
Copied from CRM [id number]. Topic: General - Deceased Patient >> Jun 13, 2018  9:15 AM Terisa Starraylor, Brittany L wrote: Reason for CRM: Lupita LeashDonna with [facility] called and wanted Dr Abner GreenspanBlyth to know that he passed away this morning.   Route to department's PEC Pool.

## 2018-07-03 NOTE — Telephone Encounter (Signed)
Can you mark as deceased

## 2018-07-03 NOTE — Telephone Encounter (Signed)
Done

## 2018-07-03 DEATH — deceased

## 2018-07-15 ENCOUNTER — Ambulatory Visit: Payer: Medicare HMO | Admitting: Family Medicine

## 2019-06-22 IMAGING — DX DG CHEST 2V
2 series · 2 of 2 positions shown · non-contrast
Comparison: 12/18/2017, 08/13/2017 and 09/21/2015 chest x-ray.

CLINICAL DATA: [AGE] male with cough for several weeks. CHF.
Initial encounter.

EXAM:
CHEST - 2 VIEW

[chest lat]
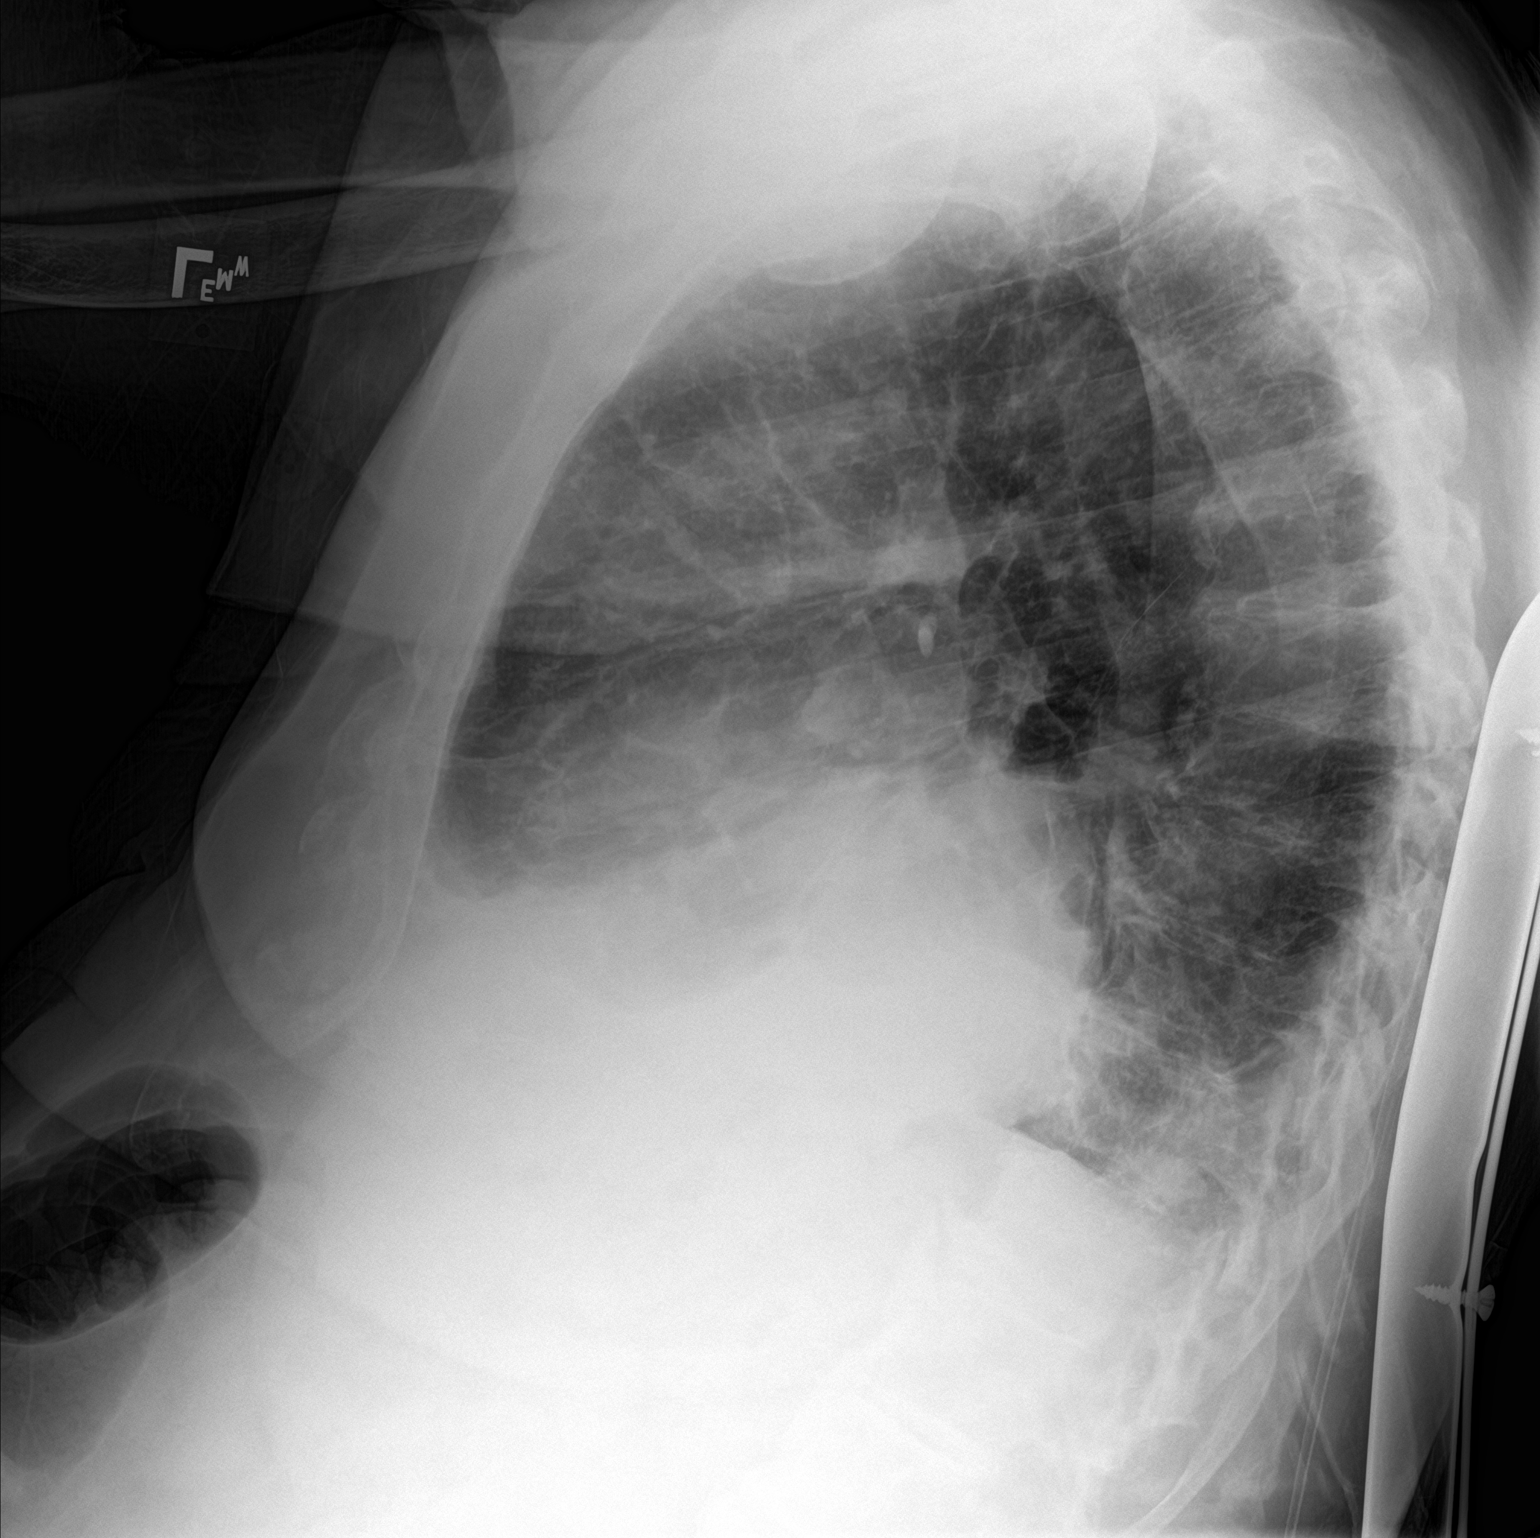

[chest ap strecther]
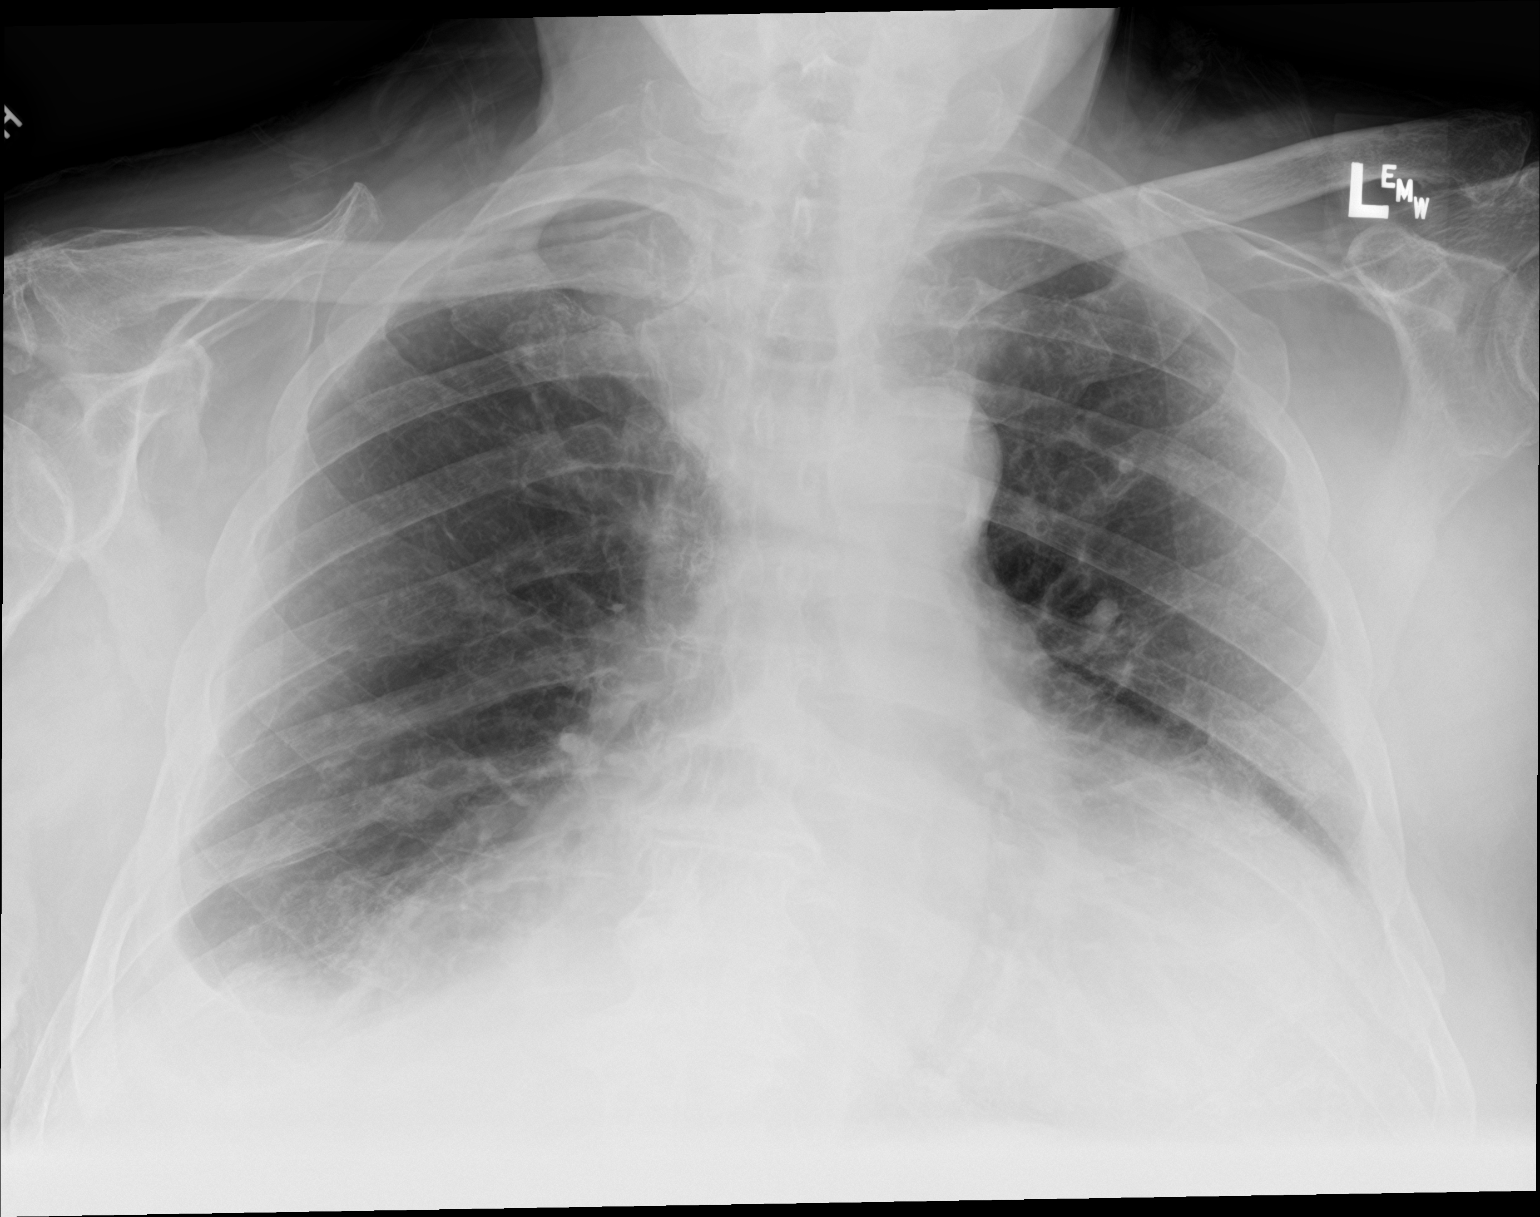

[2 of 2 positions shown; findings below may reference images not displayed]

FINDINGS: Chronic loculated right-sided pleural effusion/basilar
consolidation. No new consolidation noted. Central pulmonary
vascular prominence without pulmonary edema. Cardiomegaly. Calcified
tortuous aorta. No acute osseous abnormality.
IMPRESSION: 1. Chronic loculated right-sided pleural effusion/basilar
consolidation.
2. No new consolidation noted.
3. Central pulmonary vascular prominence without pulmonary edema.
4. Cardiomegaly.
5.  Aortic Atherosclerosis ([id number]).

## 2019-09-17 IMAGING — CR DG FOREARM 2V*R*
2 series · 2 of 2 positions shown · non-contrast
Comparison: None.

CLINICAL DATA: Unwitnessed fall.

EXAM:
RIGHT FOREARM - 2 VIEW

[x forearm lat right]
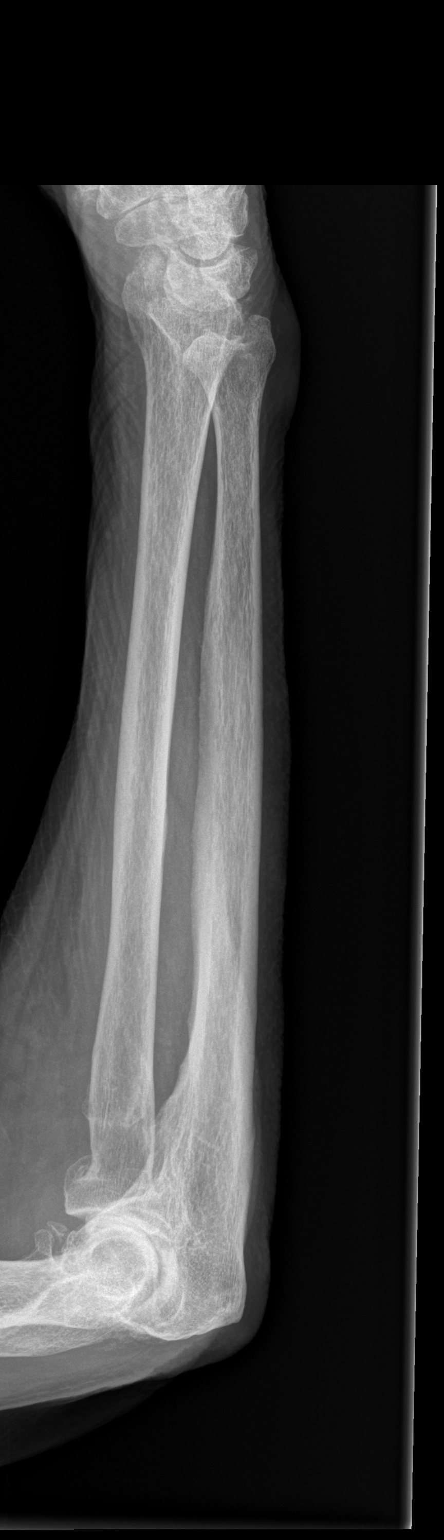

[x forearm ap right]
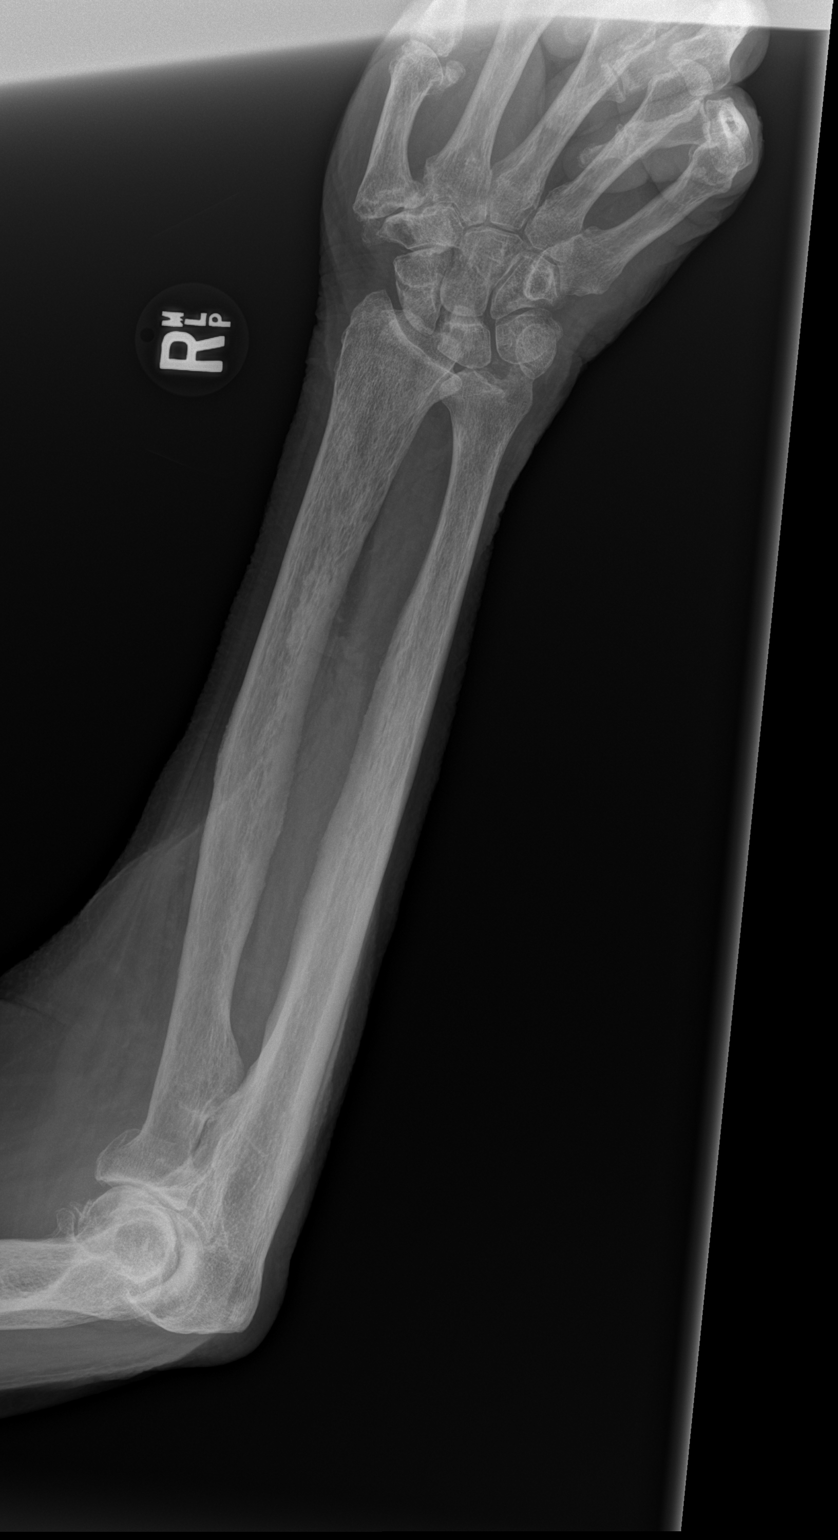

[2 of 2 positions shown; findings below may reference images not displayed]

FINDINGS: There is no evidence of fracture or other focal bone lesions. Soft
tissues are unremarkable.
IMPRESSION: No acute abnormality seen in the right forearm.

## 2019-09-18 IMAGING — CT CT CERVICAL SPINE W/O CM
3 of 7 series · 12 of 33 positions shown, 13 images · non-contrast
Comparison: CT HEAD and cervical spine June 06, 2018

CLINICAL DATA: Found down.  Frequent falls.  History of dementia.

EXAM:
CT HEAD WITHOUT CONTRAST
CT CERVICAL SPINE WITHOUT CONTRAST
TECHNIQUE: Multidetector CT imaging of the head and cervical spine was
performed following the standard protocol without intravenous
contrast. Multiplanar CT image reconstructions of the cervical spine
were also generated.

[Series 5: coronal soft tissue · coronal · 0.32mm/px · 3 of 72 slices shown]
[im 6/72  bone]
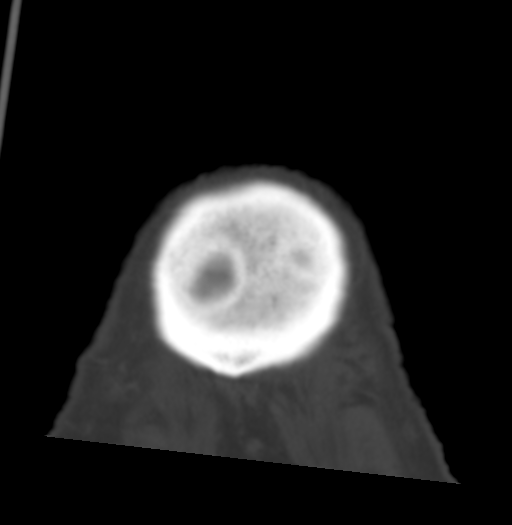
[im 11/72  bone]
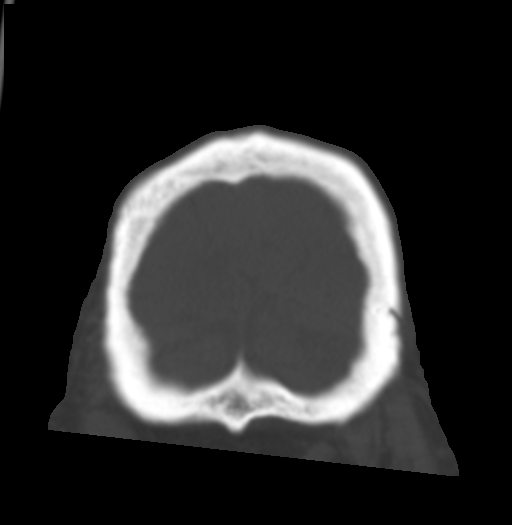
[im 16/72  bone]
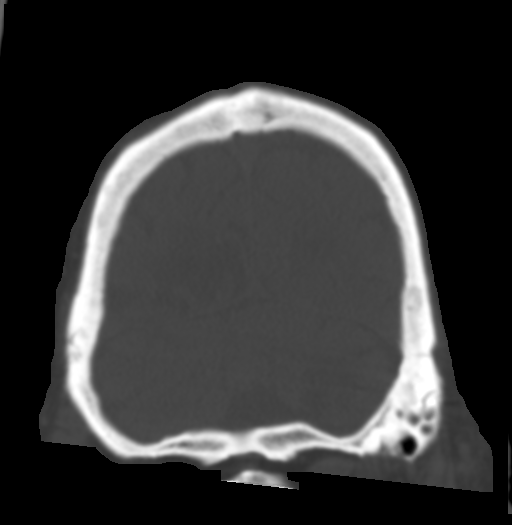

[Series 10: orthogonal bone · axial · 0.23mm/px · z∈[-289,-157]mm · 4 of 121 slices shown, 5 images]
[im 21/121  soft-tissue]
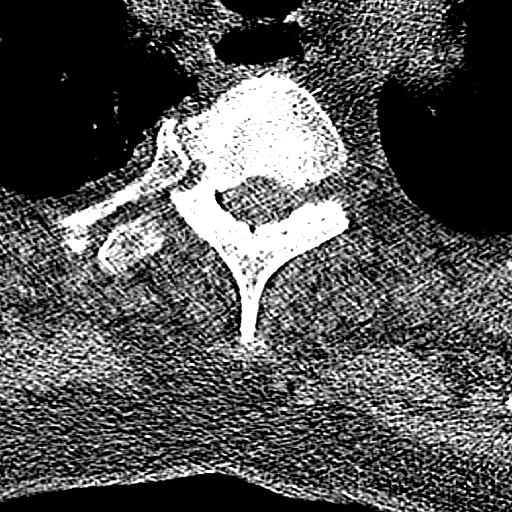
[im 21/121  bone]
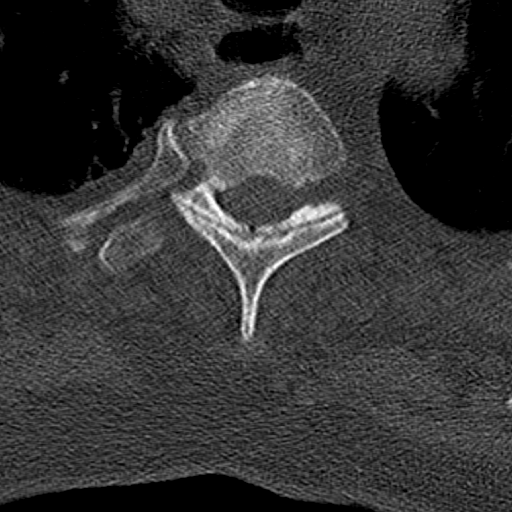
[im 41/121  bone]
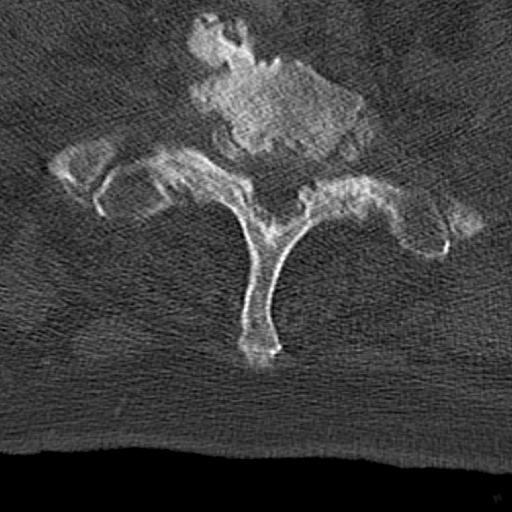
[im 81/121  bone]
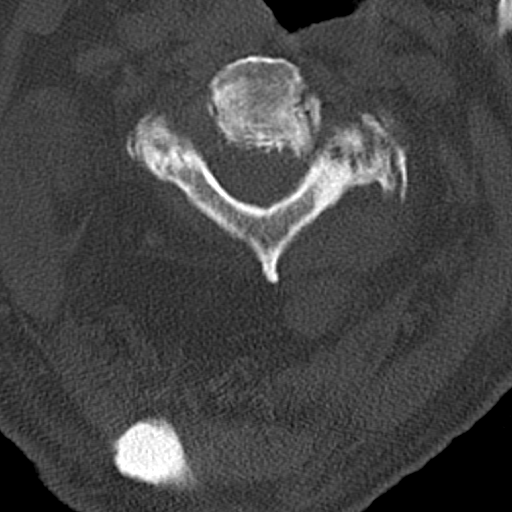
[im 101/121  bone]
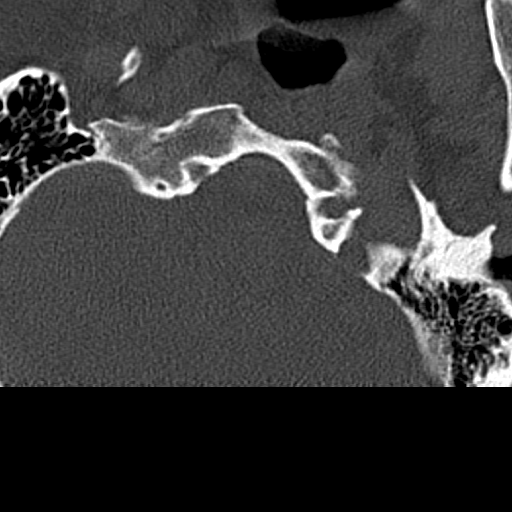

[Series 12: sagittal bone · sagittal · 0.23mm/px · 5 of 61 slices shown]
[im 11/61  bone]
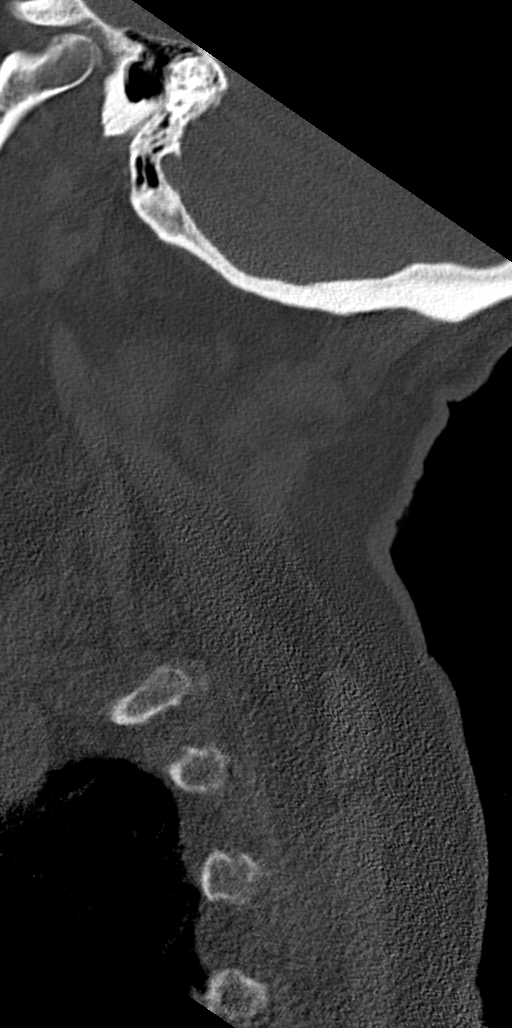
[im 21/61  bone]
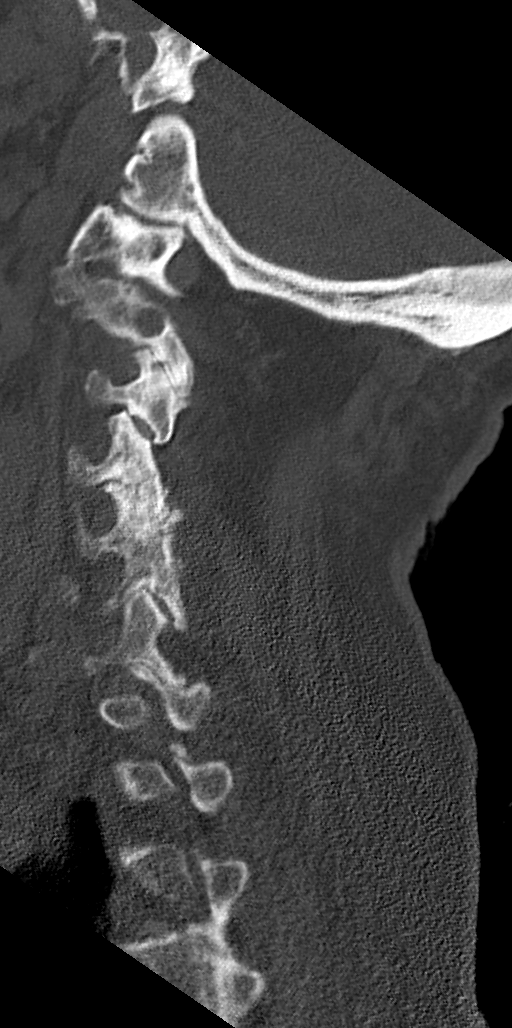
[im 31/61  bone]
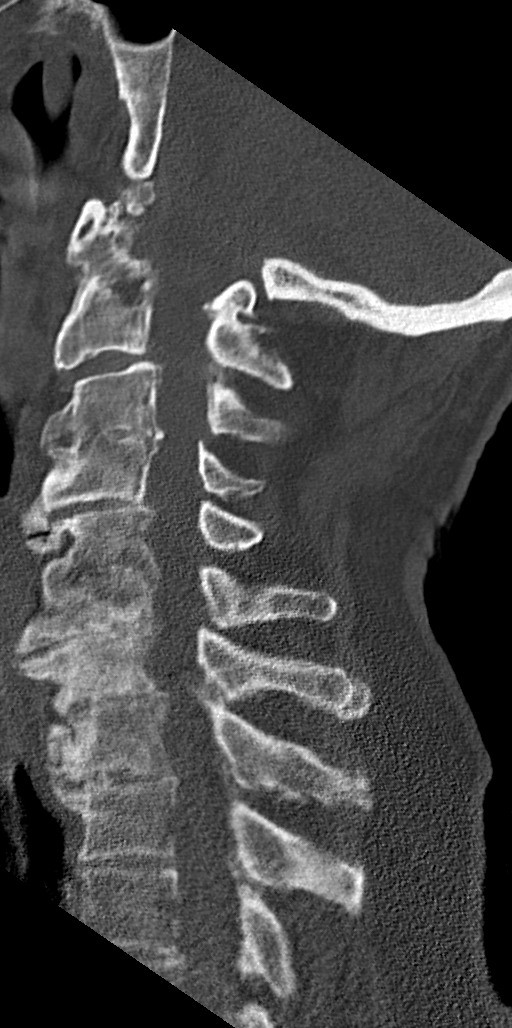
[im 41/61  bone]
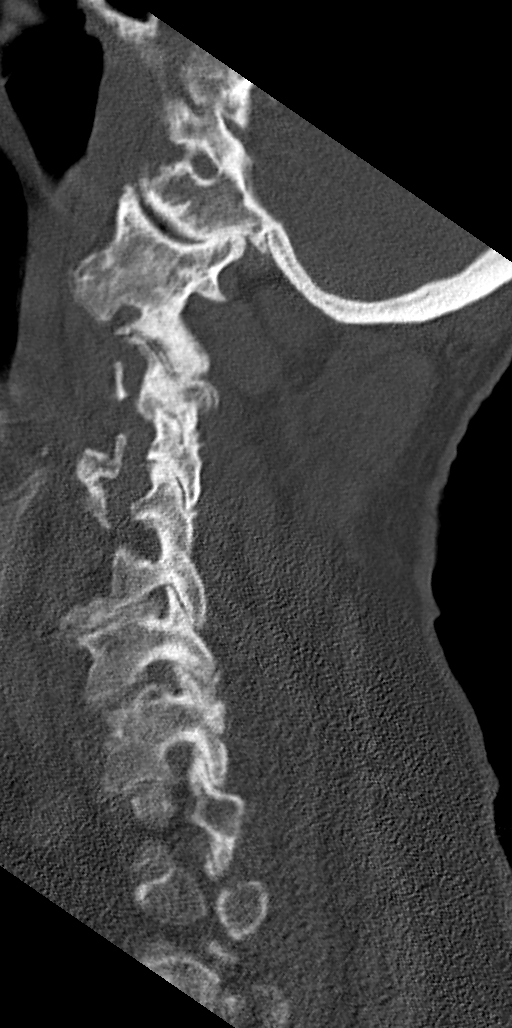
[im 51/61  bone]
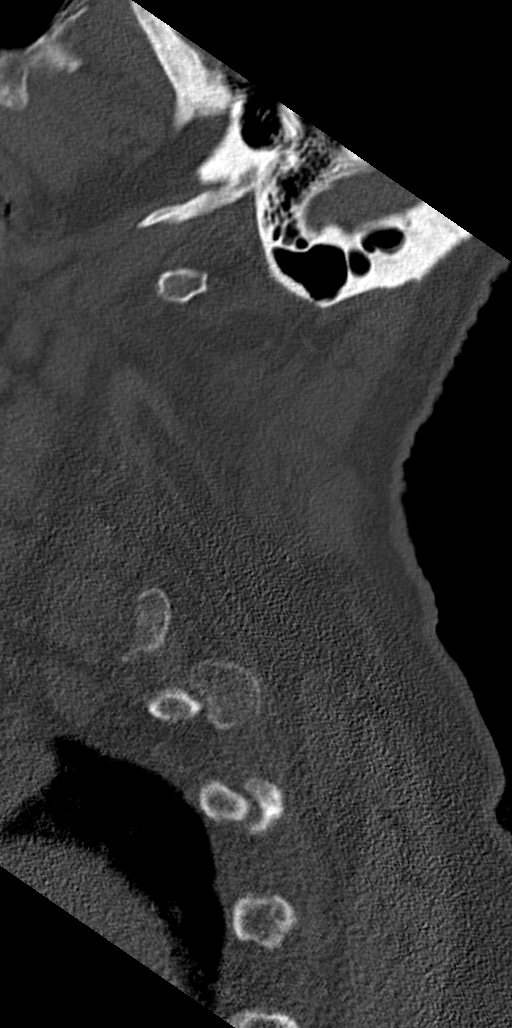

[12 of 33 positions shown; findings below may reference images not displayed]

FINDINGS: CT HEAD FINDINGS

BRAIN: No intraparenchymal hemorrhage, mass effect nor midline
shift. LEFT greater than RIGHT cerebellar and RIGHT occipital lobe
encephalomalacia. Old LEFT basal ganglia infarct. Moderate to severe
parenchymal brain volume loss. No hydrocephalus. Patchy
supratentorial white matter hypodensities within normal range for
patient's age, though non-specific are most compatible with chronic
small vessel ischemic disease. No acute large vascular territory
infarcts. No abnormal extra-axial fluid collections. Basal cisterns
are patent.

VASCULAR: Moderate calcific atherosclerosis of the carotid siphons.

SKULL: No skull fracture. No significant scalp soft tissue swelling.
Asymmetric RIGHT paraspinal muscle atrophy.

SINUSES/ORBITS: Trace paranasal sinus mucosal thickening. Mastoid
air cells are well aerated.Similar dense 11 mm LEFT medial orbital
extraconal apex mass though, new from [id number].

OTHER: None.

CT CERVICAL SPINE FINDINGS

ALIGNMENT: Straighten lordosis. Grade 1 C7-T1 anterolisthesis.
Cervicothoracic levoscoliosis. C1-2 arthrodesis.

SKULL BASE AND VERTEBRAE: Old healed base of dens fracture.
Remaining vertebral bodies intact. Severe cervical spondylosis and
severe facet arthropathy stable from prior examination. No
destructive bony lesions.

SOFT TISSUES AND SPINAL CANAL: Nonacute. RIGHT semispinalis muscle
atrophy.

DISC LEVELS: Similar multilevel canal stenosis and advanced neural
foraminal narrowing.

UPPER CHEST: Lung apices are clear.

OTHER: None.
IMPRESSION: CT HEAD:

1. No acute intracranial process.
2. Stable examination including old RIGHT occipital/PCA and
bilateral cerebellar infarcts. Old LEFT basal ganglia infarct.
3. Dense 11 mm LEFT orbital apex mass, differential diagnosis
includes lymphoma or metastasis. Findings would be better
demonstrated on MRI of the orbits with and without contrast as
clinically indicated.

CT CERVICAL SPINE:

1. No acute fracture or malalignment.  Old healed C2 fracture.
